# Patient Record
Sex: Female | Born: 1970 | Race: White | Hispanic: No | Smoking: Current every day smoker
Health system: Southern US, Community
[De-identification: ages and names within clinical notes are randomized; demographics above are authoritative.]

## PROBLEM LIST (undated history)

## (undated) DIAGNOSIS — IMO0001 Reserved for inherently not codable concepts without codable children: Secondary | ICD-10-CM

## (undated) DIAGNOSIS — I10 Essential (primary) hypertension: Secondary | ICD-10-CM

## (undated) DIAGNOSIS — G629 Polyneuropathy, unspecified: Secondary | ICD-10-CM

## (undated) DIAGNOSIS — R5383 Other fatigue: Secondary | ICD-10-CM

## (undated) DIAGNOSIS — F419 Anxiety disorder, unspecified: Secondary | ICD-10-CM

## (undated) DIAGNOSIS — E119 Type 2 diabetes mellitus without complications: Secondary | ICD-10-CM

## (undated) DIAGNOSIS — K859 Acute pancreatitis without necrosis or infection, unspecified: Secondary | ICD-10-CM

## (undated) DIAGNOSIS — K219 Gastro-esophageal reflux disease without esophagitis: Secondary | ICD-10-CM

## (undated) DIAGNOSIS — M1990 Unspecified osteoarthritis, unspecified site: Secondary | ICD-10-CM

## (undated) HISTORY — PX: ABSCESS DRAINAGE: SHX1119

## (undated) HISTORY — DX: Reserved for inherently not codable concepts without codable children: IMO0001

## (undated) HISTORY — PX: ANAL FISSURE REPAIR: SHX2312

## (undated) HISTORY — DX: Essential (primary) hypertension: I10

## (undated) HISTORY — PX: TUBAL LIGATION: SHX77

## (undated) HISTORY — DX: Anxiety disorder, unspecified: F41.9

## (undated) HISTORY — DX: Gastro-esophageal reflux disease without esophagitis: K21.9

## (undated) HISTORY — PX: TONSILLECTOMY AND ADENOIDECTOMY: SUR1326

## (undated) HISTORY — DX: Unspecified osteoarthritis, unspecified site: M19.90

## (undated) HISTORY — PX: CARDIAC CATHETERIZATION: SHX172

## (undated) HISTORY — DX: Type 2 diabetes mellitus without complications: E11.9

## (undated) HISTORY — DX: Acute pancreatitis without necrosis or infection, unspecified: K85.90

## (undated) HISTORY — DX: Other fatigue: R53.83

---

## 1994-11-17 HISTORY — PX: GALLBLADDER SURGERY: SHX652

## 2006-03-18 ENCOUNTER — Inpatient Hospital Stay: Payer: Self-pay | Admitting: Internal Medicine

## 2006-03-18 ENCOUNTER — Other Ambulatory Visit: Payer: Self-pay

## 2006-03-26 ENCOUNTER — Ambulatory Visit: Payer: Self-pay | Admitting: Family Medicine

## 2006-09-08 ENCOUNTER — Other Ambulatory Visit: Payer: Self-pay

## 2006-09-08 ENCOUNTER — Emergency Department: Payer: Self-pay | Admitting: General Practice

## 2007-01-19 ENCOUNTER — Ambulatory Visit: Payer: Self-pay | Admitting: Internal Medicine

## 2007-02-22 ENCOUNTER — Encounter: Payer: Self-pay | Admitting: Internal Medicine

## 2007-02-22 ENCOUNTER — Ambulatory Visit: Payer: Self-pay | Admitting: Internal Medicine

## 2007-02-22 DIAGNOSIS — L919 Hypertrophic disorder of the skin, unspecified: Secondary | ICD-10-CM

## 2007-02-22 DIAGNOSIS — L909 Atrophic disorder of skin, unspecified: Secondary | ICD-10-CM | POA: Insufficient documentation

## 2007-02-22 DIAGNOSIS — D239 Other benign neoplasm of skin, unspecified: Secondary | ICD-10-CM | POA: Insufficient documentation

## 2007-02-22 DIAGNOSIS — G479 Sleep disorder, unspecified: Secondary | ICD-10-CM | POA: Insufficient documentation

## 2007-02-22 DIAGNOSIS — K589 Irritable bowel syndrome without diarrhea: Secondary | ICD-10-CM | POA: Insufficient documentation

## 2007-04-19 ENCOUNTER — Inpatient Hospital Stay: Payer: Self-pay | Admitting: Internal Medicine

## 2007-04-19 ENCOUNTER — Other Ambulatory Visit: Payer: Self-pay

## 2007-04-22 ENCOUNTER — Encounter: Payer: Self-pay | Admitting: Internal Medicine

## 2007-04-26 ENCOUNTER — Telehealth (INDEPENDENT_AMBULATORY_CARE_PROVIDER_SITE_OTHER): Payer: Self-pay | Admitting: *Deleted

## 2007-04-26 ENCOUNTER — Emergency Department: Payer: Self-pay | Admitting: Emergency Medicine

## 2007-04-29 DIAGNOSIS — F329 Major depressive disorder, single episode, unspecified: Secondary | ICD-10-CM | POA: Insufficient documentation

## 2007-04-29 DIAGNOSIS — F410 Panic disorder [episodic paroxysmal anxiety] without agoraphobia: Secondary | ICD-10-CM | POA: Insufficient documentation

## 2007-04-29 DIAGNOSIS — I10 Essential (primary) hypertension: Secondary | ICD-10-CM | POA: Insufficient documentation

## 2007-04-29 DIAGNOSIS — F411 Generalized anxiety disorder: Secondary | ICD-10-CM | POA: Insufficient documentation

## 2007-04-29 DIAGNOSIS — F419 Anxiety disorder, unspecified: Secondary | ICD-10-CM | POA: Insufficient documentation

## 2007-05-06 ENCOUNTER — Emergency Department: Payer: Self-pay | Admitting: Emergency Medicine

## 2007-05-10 ENCOUNTER — Ambulatory Visit: Payer: Self-pay | Admitting: Internal Medicine

## 2007-05-10 DIAGNOSIS — K859 Acute pancreatitis without necrosis or infection, unspecified: Secondary | ICD-10-CM

## 2007-05-10 DIAGNOSIS — L506 Contact urticaria: Secondary | ICD-10-CM | POA: Insufficient documentation

## 2007-05-10 DIAGNOSIS — L509 Urticaria, unspecified: Secondary | ICD-10-CM

## 2007-05-17 ENCOUNTER — Encounter: Payer: Self-pay | Admitting: Internal Medicine

## 2007-06-16 ENCOUNTER — Other Ambulatory Visit: Payer: Self-pay

## 2007-06-16 ENCOUNTER — Observation Stay: Payer: Self-pay | Admitting: Internal Medicine

## 2007-06-16 ENCOUNTER — Telehealth (INDEPENDENT_AMBULATORY_CARE_PROVIDER_SITE_OTHER): Payer: Self-pay | Admitting: *Deleted

## 2007-06-17 ENCOUNTER — Encounter: Payer: Self-pay | Admitting: Internal Medicine

## 2007-06-18 ENCOUNTER — Ambulatory Visit: Payer: Self-pay | Admitting: Internal Medicine

## 2007-06-18 DIAGNOSIS — R1013 Epigastric pain: Secondary | ICD-10-CM | POA: Insufficient documentation

## 2007-10-06 ENCOUNTER — Telehealth (INDEPENDENT_AMBULATORY_CARE_PROVIDER_SITE_OTHER): Payer: Self-pay | Admitting: *Deleted

## 2007-10-18 ENCOUNTER — Telehealth: Payer: Self-pay | Admitting: Internal Medicine

## 2007-11-01 ENCOUNTER — Ambulatory Visit: Payer: Self-pay | Admitting: Pain Medicine

## 2007-11-12 ENCOUNTER — Telehealth (INDEPENDENT_AMBULATORY_CARE_PROVIDER_SITE_OTHER): Payer: Self-pay | Admitting: *Deleted

## 2007-11-18 ENCOUNTER — Emergency Department: Payer: Self-pay | Admitting: Emergency Medicine

## 2007-11-18 ENCOUNTER — Other Ambulatory Visit: Payer: Self-pay

## 2007-12-02 ENCOUNTER — Ambulatory Visit: Payer: Self-pay | Admitting: Pain Medicine

## 2007-12-22 ENCOUNTER — Telehealth (INDEPENDENT_AMBULATORY_CARE_PROVIDER_SITE_OTHER): Payer: Self-pay | Admitting: *Deleted

## 2007-12-28 ENCOUNTER — Ambulatory Visit: Payer: Self-pay | Admitting: Pain Medicine

## 2007-12-29 ENCOUNTER — Ambulatory Visit: Payer: Self-pay | Admitting: Pain Medicine

## 2007-12-31 ENCOUNTER — Emergency Department: Payer: Self-pay | Admitting: Emergency Medicine

## 2008-01-04 ENCOUNTER — Ambulatory Visit: Payer: Self-pay | Admitting: Pain Medicine

## 2008-01-10 ENCOUNTER — Ambulatory Visit: Payer: Self-pay | Admitting: Pain Medicine

## 2008-01-12 ENCOUNTER — Ambulatory Visit: Payer: Self-pay | Admitting: Pain Medicine

## 2008-07-04 ENCOUNTER — Other Ambulatory Visit: Payer: Self-pay

## 2008-07-04 ENCOUNTER — Emergency Department: Payer: Self-pay | Admitting: Emergency Medicine

## 2008-11-04 ENCOUNTER — Emergency Department: Payer: Self-pay | Admitting: Emergency Medicine

## 2009-04-30 ENCOUNTER — Ambulatory Visit: Payer: Self-pay | Admitting: Family Medicine

## 2009-07-17 DIAGNOSIS — G4730 Sleep apnea, unspecified: Secondary | ICD-10-CM | POA: Insufficient documentation

## 2009-10-26 ENCOUNTER — Ambulatory Visit: Payer: Self-pay | Admitting: Family Medicine

## 2009-11-24 ENCOUNTER — Ambulatory Visit: Payer: Self-pay | Admitting: Family Medicine

## 2010-02-28 ENCOUNTER — Ambulatory Visit: Payer: Self-pay | Admitting: Unknown Physician Specialty

## 2010-03-06 ENCOUNTER — Inpatient Hospital Stay: Payer: Self-pay | Admitting: Internal Medicine

## 2010-03-28 ENCOUNTER — Ambulatory Visit: Payer: Self-pay | Admitting: Unknown Physician Specialty

## 2010-04-04 ENCOUNTER — Encounter: Payer: Self-pay | Admitting: Unknown Physician Specialty

## 2010-04-17 ENCOUNTER — Encounter: Payer: Self-pay | Admitting: Unknown Physician Specialty

## 2010-11-17 HISTORY — PX: KNEE SURGERY: SHX244

## 2010-11-27 DIAGNOSIS — J309 Allergic rhinitis, unspecified: Secondary | ICD-10-CM | POA: Insufficient documentation

## 2011-03-28 ENCOUNTER — Emergency Department (HOSPITAL_COMMUNITY)
Admission: EM | Admit: 2011-03-28 | Discharge: 2011-03-29 | Disposition: A | Discharge status: Home / Self Care | Payer: Medicaid Other | Attending: Emergency Medicine | Admitting: Emergency Medicine

## 2011-03-28 DIAGNOSIS — R10812 Left upper quadrant abdominal tenderness: Secondary | ICD-10-CM | POA: Insufficient documentation

## 2011-03-28 DIAGNOSIS — E785 Hyperlipidemia, unspecified: Secondary | ICD-10-CM | POA: Insufficient documentation

## 2011-03-28 DIAGNOSIS — I10 Essential (primary) hypertension: Secondary | ICD-10-CM | POA: Insufficient documentation

## 2011-03-28 DIAGNOSIS — Z9089 Acquired absence of other organs: Secondary | ICD-10-CM | POA: Insufficient documentation

## 2011-03-28 DIAGNOSIS — R109 Unspecified abdominal pain: Secondary | ICD-10-CM | POA: Insufficient documentation

## 2011-03-28 LAB — DIFFERENTIAL
Basophils Relative: 0 % (ref 0–1)
Eosinophils Absolute: 0.1 10*3/uL (ref 0.0–0.7)
Eosinophils Relative: 1 % (ref 0–5)
Monocytes Absolute: 0.6 10*3/uL (ref 0.1–1.0)
Monocytes Relative: 5 % (ref 3–12)

## 2011-03-28 LAB — CBC
MCH: 29.3 pg (ref 26.0–34.0)
MCHC: 34 g/dL (ref 30.0–36.0)
Platelets: 292 10*3/uL (ref 150–400)
RDW: 13.5 % (ref 11.5–15.5)

## 2011-03-28 LAB — URINALYSIS, ROUTINE W REFLEX MICROSCOPIC
Glucose, UA: NEGATIVE mg/dL
Ketones, ur: NEGATIVE mg/dL
Nitrite: NEGATIVE
Protein, ur: NEGATIVE mg/dL
Urobilinogen, UA: 0.2 mg/dL (ref 0.0–1.0)

## 2011-03-28 LAB — COMPREHENSIVE METABOLIC PANEL
Albumin: 3.7 g/dL (ref 3.5–5.2)
BUN: 9 mg/dL (ref 6–23)
Chloride: 99 mEq/L (ref 96–112)
Creatinine, Ser: <0.47 mg/dL (ref 0.4–1.2)
Total Bilirubin: 0.4 mg/dL (ref 0.3–1.2)

## 2011-03-31 LAB — POCT PREGNANCY, URINE: Preg Test, Ur: NEGATIVE

## 2011-08-22 ENCOUNTER — Emergency Department: Payer: Self-pay | Admitting: Emergency Medicine

## 2011-12-18 ENCOUNTER — Ambulatory Visit: Payer: Self-pay | Admitting: Family Medicine

## 2012-04-26 ENCOUNTER — Ambulatory Visit: Payer: Self-pay | Admitting: Family Medicine

## 2012-10-03 ENCOUNTER — Emergency Department: Payer: Self-pay | Admitting: Emergency Medicine

## 2012-10-04 LAB — COMPREHENSIVE METABOLIC PANEL
Albumin: 3.2 g/dL — ABNORMAL LOW (ref 3.4–5.0)
Anion Gap: 5 — ABNORMAL LOW (ref 7–16)
BUN: 15 mg/dL (ref 7–18)
Bilirubin,Total: 0.2 mg/dL (ref 0.2–1.0)
Chloride: 104 mmol/L (ref 98–107)
Creatinine: 0.76 mg/dL (ref 0.60–1.30)
EGFR (African American): >60
Glucose: 201 mg/dL — ABNORMAL HIGH (ref 65–99)
Osmolality: 278 (ref 275–301)
Potassium: 4.1 mmol/L (ref 3.5–5.1)
Total Protein: 7.7 g/dL (ref 6.4–8.2)

## 2012-10-04 LAB — URINALYSIS, COMPLETE
Ketone: NEGATIVE
Leukocyte Esterase: NEGATIVE
Ph: 6 (ref 4.5–8.0)
Protein: NEGATIVE
RBC,UR: 3 /HPF (ref 0–5)

## 2012-10-04 LAB — CBC
HCT: 34.8 % — ABNORMAL LOW (ref 35.0–47.0)
HGB: 11.9 g/dL — ABNORMAL LOW (ref 12.0–16.0)
MCV: 89 fL (ref 80–100)
RDW: 13.8 % (ref 11.5–14.5)

## 2012-10-04 LAB — LIPASE, BLOOD: Lipase: 238 U/L (ref 73–393)

## 2013-02-03 DIAGNOSIS — E1165 Type 2 diabetes mellitus with hyperglycemia: Secondary | ICD-10-CM | POA: Insufficient documentation

## 2013-05-01 ENCOUNTER — Emergency Department: Payer: Self-pay | Admitting: Emergency Medicine

## 2013-05-01 LAB — COMPREHENSIVE METABOLIC PANEL
Albumin: 3.2 g/dL — ABNORMAL LOW (ref 3.4–5.0)
BUN: 8 mg/dL (ref 7–18)
Bilirubin,Total: 0.6 mg/dL (ref 0.2–1.0)
Calcium, Total: 8.8 mg/dL (ref 8.5–10.1)
Chloride: 102 mmol/L (ref 98–107)
Creatinine: 0.63 mg/dL (ref 0.60–1.30)
EGFR (African American): >60
EGFR (Non-African Amer.): >60
Osmolality: 272 (ref 275–301)
Potassium: 3.9 mmol/L (ref 3.5–5.1)
Sodium: 136 mmol/L (ref 136–145)

## 2013-05-01 LAB — URINALYSIS, COMPLETE
Glucose,UR: NEGATIVE mg/dL (ref 0–75)
Nitrite: NEGATIVE
Ph: 5 (ref 4.5–8.0)
Specific Gravity: 1.024 (ref 1.003–1.030)

## 2013-05-01 LAB — CBC
HCT: 36.2 % (ref 35.0–47.0)
MCH: 29.7 pg (ref 26.0–34.0)
MCHC: 34.1 g/dL (ref 32.0–36.0)
RBC: 4.15 10*6/uL (ref 3.80–5.20)
RDW: 14.1 % (ref 11.5–14.5)

## 2013-05-01 LAB — PREGNANCY, URINE: Pregnancy Test, Urine: NEGATIVE m[IU]/mL

## 2013-05-01 LAB — LIPASE, BLOOD: Lipase: 99 U/L (ref 73–393)

## 2013-08-16 ENCOUNTER — Ambulatory Visit: Payer: Self-pay | Admitting: Family Medicine

## 2013-08-18 ENCOUNTER — Encounter: Payer: Self-pay | Admitting: *Deleted

## 2013-08-19 ENCOUNTER — Encounter: Payer: Self-pay | Admitting: Podiatry

## 2013-08-19 ENCOUNTER — Ambulatory Visit (INDEPENDENT_AMBULATORY_CARE_PROVIDER_SITE_OTHER): Payer: BC Managed Care – PPO | Admitting: Podiatry

## 2013-08-19 VITALS — BP 116/63 | HR 61 | Temp 97.4°F | Resp 16 | Ht 64.0 in | Wt 339.6 lb

## 2013-08-19 DIAGNOSIS — M722 Plantar fascial fibromatosis: Secondary | ICD-10-CM

## 2013-08-19 MED ORDER — TRIAMCINOLONE ACETONIDE 10 MG/ML IJ SUSP
5.0000 mg | Freq: Once | INTRAMUSCULAR | Status: AC
  Administered 2013-08-19: 5 mg via INTRA_ARTICULAR

## 2013-08-19 NOTE — Progress Notes (Signed)
Subjective:     Patient ID: Gabrielle Sampson, female   DOB: [birth year], 42 y.o.   MRN: [id number]  HPI patient presents stating her left heel is hurting in a different place.   Review of Systems  All other systems reviewed and are negative.       Objective:   Physical Exam  Cardiovascular: Intact distal pulses.    Left heel is now painful in the outside plantar medial and central band currently doing well.    Assessment:     Compensation of the heel and patient's gait creating pain in the plantar lateral aspect of the heel.    Plan:     We will do one more injection to the outside plantar heel staying away from the area we have worked on previously. If symptoms do not respond I've advised her to see Dr. Al Corpus who has seen her from the beginning and we'll need to consider surgical intervention or other more aggressive treatment plan. Prepped the lateral side of the left heel and then injected with 3 mg Kenalog with xylocaine, marcaine mixture. Applied a Band-Aid to wear the injection was performed

## 2013-08-19 NOTE — Patient Instructions (Signed)
If no improvement see Dr. Al Corpus to consider surgery

## 2013-09-15 ENCOUNTER — Ambulatory Visit: Payer: BC Managed Care – PPO | Admitting: Podiatry

## 2013-10-06 ENCOUNTER — Ambulatory Visit: Payer: BC Managed Care – PPO | Admitting: Podiatry

## 2014-01-26 ENCOUNTER — Emergency Department: Payer: Self-pay | Admitting: Emergency Medicine

## 2014-04-06 ENCOUNTER — Ambulatory Visit: Payer: BC Managed Care – PPO | Admitting: Podiatry

## 2014-07-09 ENCOUNTER — Emergency Department: Payer: Self-pay | Admitting: Internal Medicine

## 2014-07-09 LAB — COMPREHENSIVE METABOLIC PANEL
ALBUMIN: 2.9 g/dL — AB (ref 3.4–5.0)
ALK PHOS: 114 U/L
Anion Gap: 10 (ref 7–16)
BUN: 11 mg/dL (ref 7–18)
Bilirubin,Total: 1 mg/dL (ref 0.2–1.0)
CALCIUM: 8.7 mg/dL (ref 8.5–10.1)
CO2: 23 mmol/L (ref 21–32)
CREATININE: 0.59 mg/dL — AB (ref 0.60–1.30)
Chloride: 102 mmol/L (ref 98–107)
EGFR (African American): >60
GLUCOSE: 117 mg/dL — AB (ref 65–99)
Osmolality: 271 (ref 275–301)
POTASSIUM: 5.5 mmol/L — AB (ref 3.5–5.1)
SGOT(AST): 53 U/L — ABNORMAL HIGH (ref 15–37)
SGPT (ALT): 21 U/L
Sodium: 135 mmol/L — ABNORMAL LOW (ref 136–145)
TOTAL PROTEIN: 8.2 g/dL (ref 6.4–8.2)

## 2014-07-09 LAB — URINALYSIS, COMPLETE
BILIRUBIN, UR: NEGATIVE
GLUCOSE, UR: NEGATIVE mg/dL (ref 0–75)
KETONE: NEGATIVE
Nitrite: NEGATIVE
Ph: 6 (ref 4.5–8.0)
Protein: 30
RBC,UR: 5 /HPF (ref 0–5)
SPECIFIC GRAVITY: 1.023 (ref 1.003–1.030)

## 2014-07-09 LAB — CBC
HCT: 33.7 % — ABNORMAL LOW (ref 35.0–47.0)
HGB: 10.9 g/dL — ABNORMAL LOW (ref 12.0–16.0)
MCH: 28.1 pg (ref 26.0–34.0)
MCHC: 32.4 g/dL (ref 32.0–36.0)
MCV: 87 fL (ref 80–100)
Platelet: 193 10*3/uL (ref 150–440)
RBC: 3.89 10*6/uL (ref 3.80–5.20)
RDW: 15.8 % — AB (ref 11.5–14.5)
WBC: 14 10*3/uL — ABNORMAL HIGH (ref 3.6–11.0)

## 2014-07-09 LAB — LIPASE, BLOOD: Lipase: 134 U/L (ref 73–393)

## 2014-08-23 DIAGNOSIS — Z6841 Body Mass Index (BMI) 40.0 and over, adult: Secondary | ICD-10-CM

## 2014-09-01 ENCOUNTER — Emergency Department: Payer: Self-pay | Admitting: Emergency Medicine

## 2014-09-01 LAB — COMPREHENSIVE METABOLIC PANEL
ALBUMIN: 3.2 g/dL — AB (ref 3.4–5.0)
AST: 36 U/L (ref 15–37)
Alkaline Phosphatase: 151 U/L — ABNORMAL HIGH
Anion Gap: 8 (ref 7–16)
BILIRUBIN TOTAL: 0.5 mg/dL (ref 0.2–1.0)
BUN: 12 mg/dL (ref 7–18)
CHLORIDE: 100 mmol/L (ref 98–107)
CO2: 29 mmol/L (ref 21–32)
CREATININE: 0.7 mg/dL (ref 0.60–1.30)
Calcium, Total: 9 mg/dL (ref 8.5–10.1)
Glucose: 188 mg/dL — ABNORMAL HIGH (ref 65–99)
OSMOLALITY: 279 (ref 275–301)
POTASSIUM: 4.2 mmol/L (ref 3.5–5.1)
SGPT (ALT): 30 U/L
Sodium: 137 mmol/L (ref 136–145)
Total Protein: 8.6 g/dL — ABNORMAL HIGH (ref 6.4–8.2)

## 2014-09-01 LAB — CBC
HCT: 35.9 % (ref 35.0–47.0)
HGB: 11.7 g/dL — AB (ref 12.0–16.0)
MCH: 27.6 pg (ref 26.0–34.0)
MCHC: 32.6 g/dL (ref 32.0–36.0)
MCV: 85 fL (ref 80–100)
Platelet: 295 10*3/uL (ref 150–440)
RBC: 4.24 10*6/uL (ref 3.80–5.20)
RDW: 15.2 % — ABNORMAL HIGH (ref 11.5–14.5)
WBC: 14 10*3/uL — AB (ref 3.6–11.0)

## 2014-09-03 ENCOUNTER — Emergency Department: Payer: Self-pay | Admitting: Emergency Medicine

## 2015-01-11 ENCOUNTER — Ambulatory Visit: Payer: Self-pay | Admitting: Family Medicine

## 2015-01-24 ENCOUNTER — Inpatient Hospital Stay: Payer: Self-pay | Admitting: Internal Medicine

## 2015-01-27 DIAGNOSIS — Z8614 Personal history of Methicillin resistant Staphylococcus aureus infection: Secondary | ICD-10-CM | POA: Insufficient documentation

## 2015-02-05 ENCOUNTER — Inpatient Hospital Stay: Payer: Self-pay | Admitting: Internal Medicine

## 2015-02-08 LAB — BASIC METABOLIC PANEL
ANION GAP: 5 — AB (ref 7–16)
BUN: 19 mg/dL
CALCIUM: 8.7 mg/dL — AB
Chloride: 103 mmol/L
Co2: 29 mmol/L
Creatinine: 0.76 mg/dL
EGFR (Non-African Amer.): >60
GLUCOSE: 107 mg/dL — AB
Potassium: 4 mmol/L
SODIUM: 137 mmol/L

## 2015-02-08 LAB — CBC WITH DIFFERENTIAL/PLATELET
BASOS PCT: 0.7 %
Basophil #: 0.1 10*3/uL (ref 0.0–0.1)
Eosinophil #: 0.2 10*3/uL (ref 0.0–0.7)
Eosinophil %: 2.5 %
HCT: 28.2 % — AB (ref 35.0–47.0)
HGB: 8.8 g/dL — AB (ref 12.0–16.0)
LYMPHS ABS: 2.2 10*3/uL (ref 1.0–3.6)
Lymphocyte %: 26.9 %
MCH: 26.5 pg (ref 26.0–34.0)
MCHC: 31.2 g/dL — AB (ref 32.0–36.0)
MCV: 85 fL (ref 80–100)
MONO ABS: 0.5 x10 3/mm (ref 0.2–0.9)
Monocyte %: 6 %
NEUTROS ABS: 5.2 10*3/uL (ref 1.4–6.5)
Neutrophil %: 63.9 %
Platelet: 244 10*3/uL (ref 150–440)
RBC: 3.32 10*6/uL — ABNORMAL LOW (ref 3.80–5.20)
RDW: 15.3 % — ABNORMAL HIGH (ref 11.5–14.5)
WBC: 8.1 10*3/uL (ref 3.6–11.0)

## 2015-02-08 LAB — VANCOMYCIN, TROUGH: Vancomycin, Trough: 11 ug/mL

## 2015-02-09 LAB — VANCOMYCIN, TROUGH: VANCOMYCIN, TROUGH: 18 ug/mL

## 2015-02-10 LAB — VANCOMYCIN, TROUGH: VANCOMYCIN, TROUGH: 17 ug/mL

## 2015-02-22 DIAGNOSIS — L02211 Cutaneous abscess of abdominal wall: Secondary | ICD-10-CM | POA: Insufficient documentation

## 2015-02-28 DIAGNOSIS — G2581 Restless legs syndrome: Secondary | ICD-10-CM | POA: Insufficient documentation

## 2015-03-18 NOTE — H&P (Signed)
PATIENT NAME:  Gabrielle Sampson, Gabrielle Sampson MR#:  [id number] DATE OF BIRTH:  Mar 04, 1971  DATE OF ADMISSION:  02/05/2015  PRIMARY CARE PROVIDER:  Abby D. Bender, MD   CHIEF COMPLAINT:  Pain in the suprapubic area with purulent discharge, fever, and weakness.   HISTORY OF PRESENTING ILLNESS:  This is a 44 year old Caucasian female patient with history of morbid obesity, diabetes, and hypertension, recently in the hospital for abdominal wall abscess in the suprapubic area and discharged home with a PICC line on IV vancomycin and oral clindamycin on 01/30/2015, who returns with no significant improvement of her symptoms.   The patient was in the hospital from 01/24/2015, to 01/30/2015. She had an I and D done with Penrose drains on 01/26/2015. The patient has been getting vancomycin. She did miss one dose on 02/02/2015, due to some itching from the vancomycin dose, but has been taking all of her other doses regularly. She did have some blood drawn on 02/02/2015, for vancomycin level; results are unknown at this point.   Her blood sugars have been fairly controlled around 120 according to the patient. Her temperatures have been running in the high 99s, but have not crossed 100 at home. Her dressings have been changed by her daughter, who is a [employer].   PAST MEDICAL HISTORY: 1.  Diabetes mellitus.  2.  Hypertension.  3.  Morbid obesity.  4.  Chronic pancreatitis.  5.  Anxiety.  6.  Depression.  7.  Hypertriglyceridemia.   SOCIAL HISTORY:  The patient quit smoking in the past. She does not drink any alcohol. No illicit drugs. Ambulates on her own.   FAMILY HISTORY:  Father had MI in his 33s along with hypertension and CVA.   REVIEW OF SYSTEMS:  CONSTITUTIONAL:  Complains of fever, chills, and fatigue.  EYES:  No blurred vision, pain, or redness.  EARS, NOSE, AND THROAT:  No tinnitus, ear pain, or hearing loss.  RESPIRATORY:  No cough, wheeze, or hemoptysis.  CARDIOVASCULAR:  No chest pain, orthopnea, or  edema. GASTROINTESTINAL:  Has lower abdominal pain.  GENITOURINARY:  No dysuria or hematuria.  ENDOCRINE:  No polyuria or nocturia.  NEUROLOGIC:  No focal numbness or weakness.  PSYCHIATRIC:  Has depression.   HOME MEDICATIONS: 1.  Vancomycin 1500 mg IV 2 times a day.  2.  Clindamycin 300 mg 3 times a day.  3.  Clonazepam 1 mg daily.  4.  Gabapentin 300 mg 2 times a day.  5.  Glipizide 10 mg daily.  6.  Lisinopril 10 mg daily.  7.  Metoprolol tartrate 50 mg 2 times a day.  8.  Omeprazole 40 mg daily.  9.  Bupropion 300 mg extended-release daily.  10.  Acetaminophen/oxycodone 325/10, 1 tablet every 6 hours as needed for pain.   ALLERGIES:  BETADINE, CODEINE, IODINE, MORPHINE.   PHYSICAL EXAMINATION: VITAL SIGNS:  Temperature 98.3, pulse rate 64, blood pressure 131/70, saturating 97% on room air.  GENERAL:  Morbidly obese Caucasian female patient lying in bed in mild distress secondary to her pain.  PSYCHIATRIC:  Alert and oriented x 3, pleasant.  HEENT:  Atraumatic, normocephalic. Oral mucosa is moist and pink. External ears and nose are normal. No pallor. No icterus. Pupils are bilaterally equal and reactive to light.  NECK:  Supple. No thyromegaly. No palpable lymph nodes. Trachea is midline. No carotid bruit or JVD.  CARDIOVASCULAR:  S1 and S2 without any murmurs. Peripheral pulses are 2+. No edema.  RESPIRATORY:  Normal work of breathing.  Clear to auscultation on both sides.  GASTROINTESTINAL:  Soft abdomen, nontender. Bowel sounds were present. No hepatosplenomegaly palpable.  SKIN:  Warm and dry. Has erythema and swelling in the suprapubic area with some purulent discharge around the drain. No fluctuance noticed.  NEUROLOGICAL:  Motor strength is 5 out of 5 in upper and lower extremities.   LABORATORY STUDIES:  Glucose of 86, BUN 8, creatinine 0.863. AST, ALT, alkaline phosphatase were normal. WBC was 9.6, hemoglobin 8.7, and platelets were 245,000.   Recent vancomycin level  on 01/30/2015, was 10.   CT scan of the abdomen and pelvis without contrast shows no drainable fluid, suprapubic area with soft tissue swelling.   ASSESSMENT AND PLAN: 1.  Suprapubic area abdominal wall cellulitis. The patient is status post incision and drainage, on vancomycin IV and oral clindamycin. The patient seems to have worsening symptoms with some fever at home. At this point, I will admit the patient. The patient had methicillin-resistant Staphylococcus aureus grow out of cultures from the incision and drainage. We will have to get a vancomycin level to see if she needs higher doses of vancomycin at home. We will keep the PICC line. Continue antibiotics. Consult Dr. Ola Spurr with infectious disease. No drainable fluid on CAT scan. The patient is afebrile here in the Emergency Room with normal white count.  2.  Diabetes mellitus. Continue home medications along with sliding scale insulin.  3.  Hypertension. Continue lisinopril. 4.  Anemia of chronic disease, stable.  5.  Deep vein thrombosis prophylaxis with Lovenox.   CODE STATUS:  Full code.   TIME SPENT ON THIS CASE:  35 minutes.   ____________________________ Leia Alf Aubery Date, MD srs:nb D: 02/05/2015 03:41:16 ET T: 02/05/2015 04:27:00 ET JOB#: [id number]  cc: Alveta Heimlich R. Lane Kjos, MD, <Dictator> Neita Carp MD ELECTRONICALLY SIGNED 02/06/2015 2:25

## 2015-03-18 NOTE — H&P (Signed)
PATIENT NAME:  Gabrielle Sampson, Gabrielle Sampson MR#:  [id number] DATE OF BIRTH:  1970/12/16  DATE OF ADMISSION:  01/24/2015  ADDENDUM:  ALLERGIES: BETADINE, CODEINE, IODINE STRONG, MORPHINE.  HOME MEDICATIONS: Omeprazole 40 mg p.o. daily, Lopressor 50 mg p.o. b.i.d., lisinopril 10 mg p.o. daily, gabapentin 300 mg 2 capsules in the morning 1 capsule in the evening, clonazepam 1 mg p.o. at bedtime, citalopram 20 mg p.o. daily in the morning, bupropion 300 mg 1 tablet once a day at bedtime.    ____________________________ Demetrios Loll, MD qc:TM D: 01/24/2015 17:21:05 ET T: 01/24/2015 17:31:05 ET JOB#: [id number]  cc: Demetrios Loll, MD, <Dictator> Demetrios Loll MD ELECTRONICALLY SIGNED 01/24/2015 22:45

## 2015-03-18 NOTE — Consult Note (Signed)
PATIENT NAME:  Gabrielle Sampson, Gabrielle Sampson MR#:  [id number] DATE OF BIRTH:  20-Aug-1971  DATE OF CONSULTATION:  01/29/2015  REFERRING PHYSICIAN:   CONSULTING PHYSICIAN:  Cheral Marker. Ola Spurr, MD  REQUESTING PHYSICIAN:  Gladstone Lighter, MD.       REASON FOR CONSULTATION: MRSA infection.   HISTORY OF PRESENT ILLNESS: This is a very pleasant 44 year old female with history of well-controlled diabetes as well as morbid obesity, who was admitted March 9 with pain and redness in her pubic area. She reports she had developed what appeared to be an abscess there the day prior and went to [facility]. She had an I and D done and was started on Bactrim, however she became quite ill with nausea and vomiting, and the redness spread, so she came to the Emergency Room. Since admission the patient has had surgical I and D by Dr. Rexene Edison March 11. She has a Penrose drain in place. She remains on vancomycin. Culture is growing MRSA. We are consulted for further antibiotic management.   Currently she reports still being in pain and still having drainage. She is also quite constipated.   PAST MEDICAL HISTORY:  1. Hypertension.  2. Diabetes, is well controlled.  3. Chronic pancreatitis.  4. Anxiety.  5. Depression.  6. Hypertriglyceridemia.   PAST SURGICAL HISTORY: Cholecystectomy, tubal ligation, C-section, right knee surgery, tonsillectomy, adenoidectomy, uterine operation, and AAA repair.   SOCIAL HISTORY: She does not smoke, does not drink. 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 Lives with her daughters.   FAMILY HISTORY: Noncontributory.   REVIEW OF SYSTEMS: 11 systems reviewed and negative except as per HPI.   ALLERGIES: SHE IS ALLERGIC TO BETADINE, CODEINE, IODINE, AND MORPHINE.   ANTIBIOTICS SINCE ADMISSION: Unasyn which she received March 9-12, clindamycin begun March 12, vancomycin begun March 9.   PHYSICAL EXAMINATION:  VITAL SIGNS: Temperature 97.8, pulse 68, blood pressure 107/49,  respirations 18, saturation 95%.  GENERAL: She is morbidly obese, lying in bed, in no acute distress.  HEENT: Pupils reactive. Sclerae anicteric. Oropharynx clear.  NECK: Supple.  HEART: Regular.  LUNGS: Clear.  ABDOMEN: Soft, obese.  SKIN:  She has over her perineum a marked swelling with peau d'orange skin. She has several incisions sites with Penrose drains. The left side is soft, however over the right there is some induration and tenderness.   LABORATORY DATA: Culture from her surgical site grew moderate growth of MRSA sensitive to Bactrim, clindamycin, ciprofloxacin, gentamicin, vancomycin, levofloxacin, linezolid, tigecycline. White blood count on March 9 was 11.8, currently 8.9, hemoglobin 9.8, platelets 180,000. A1c is 6.5.  Renal function is normal, creatinine 0.7.   IMPRESSION: A 44 year old with well-controlled diabetes, as well as morbid obesity, admitted with pubic abscess status post intraoperative incision and drainage March 12, cultures with methicillin-resistant Staphylococcus aureus. Drains are in place. She is afebrile, however still has significant pain and induration.   RECOMMENDATIONS:  1.  She will need at least 7-10 more days of IV antibiotics. Suggest placing a PICC which I have ordered. She will need the vancomycin and can have oral clindamycin for the next few days as well.  2.  I can see in clinic in followup in approximately 1 week to follow up. She may need further surgical drainage if the abscess remains loculated.  3.  Thank you for the consult. I will be glad to follow with you.      ____________________________ Cheral Marker. Ola Spurr, MD dpf:bu D: 01/29/2015 14:46:12 ET T: 01/29/2015 15:16:03 ET  JOB#: [id number]  cc: Cheral Marker. Ola Spurr, MD, <Dictator> Mell Guia Ola Spurr MD ELECTRONICALLY SIGNED 02/08/2015 22:10

## 2015-03-18 NOTE — Consult Note (Signed)
PATIENT NAME:  Gabrielle Sampson, Gabrielle Sampson MR#:  [id number] DATE OF BIRTH:  1971-10-16  DATE OF CONSULTATION:  01/25/2015  REFERRING PHYSICIAN:   CONSULTING PHYSICIAN:  Harrell Gave A. Damond Borchers, MD  REASON FOR CONSULTATION: Cellulitis and previous abscess in pannus, superior groin.   HISTORY OF PRESENT ILLNESS: Gabrielle Gabrielle Sampson is a pleasant 44 year old obese diabetic female who presents with approximately 5 days of suprapubic pain, redness, and abscess. Initially this began early in the morning, 4 days ago she went to [facility] had an I and D, has been feeling worse since then. It is quite tender and she also has nausea, vomiting, and general malaise. She was prescribed p.o. Bactrim prior to going at her office and was not able to take that. Did have low-grade fevers and chills. Has never had this before. No chest pain, shortness of breath, cough, diarrhea. Does feel slightly constipated. No dysuria or hematuria.   PAST MEDICAL HISTORY: Hypertension, diabetes, pancreatitis, anxiety, depression, hypertriglyceridemia, status post cholecystectomy, status post tubal ligation, status post C-section, status post knee surgery, status post tonsillectomy and adenoidectomy, status post D and C recently, and AAA repair.   SOCIAL HISTORY: Quit smoking, has history of smoking. Denies any alcohol or drugs.   FAMILY HISTORY: Coronary artery disease, hypertension, diabetes, and cancers.   REVIEW OF SYSTEMS:  12 point review of systems was obtained. Pertinent positives and negatives as above.   PHYSICAL EXAMINATION:  VITAL SIGNS: Temperature 97.8, pulse 81, blood pressure 111/70, respirations 18.  GENERAL: No acute distress, alert and oriented x 3, obese.  HEAD: Normocephalic, atraumatic.  EYES: No scleral icterus. No conjunctivitis.  FACE: No obvious face trauma. Normal external nose. Normal external ear.  CHEST: Lungs clear to auscultation. Moving air well.  HEART: Regular rate and rhythm. No murmurs, rubs, or  gallops.  ABDOMEN: Nontender, and nondistended. Suprapubic region and groin region does have diffuse cellulitis as well as a punctate lesion from her previous I and D, which does have some pus, cannot determine with confidence whether or not it is completely drained.   LABORATORY DATA: White cell count of 8.9 currently, 11.8 at admission, 81% neutrophils currently. Blood sugars are 190-241.   ASSESSMENT AND PLAN: Gabrielle Sampson is a pleasant 44 year old with what looks like cellulitis and purulence from a previous incision and drainage in her suprapubic region, cannot assess due to patient discomfort the full extent and whether or not there are loculated purulent collections.  I have spoken to her and as she has eaten today OR is not a possibility. Have discussed with her possibility of I and D tomorrow in the OR, will be n.p.o. after midnight. We will call Dr. Vianne Bulls to adjust diabetes medicines accordingly.    ____________________________ Glena Norfolk Noble Cicalese, MD cal:bu D: 01/25/2015 14:04:01 ET T: 01/25/2015 17:31:00 ET JOB#: [id number]  cc: Harrell Gave A. Birgitta Uhlir, MD, <Dictator> Floyde Parkins MD ELECTRONICALLY SIGNED 01/28/2015 11:36

## 2015-03-18 NOTE — Op Note (Signed)
PATIENT NAME:  Gabrielle Sampson, Gabrielle Sampson MR#:  [id number] DATE OF BIRTH:  23-Aug-1971  DATE OF PROCEDURE:  01/27/2015  ATTENDING:   Harrell Gave A. Aamiyah Derrick, MD  PREOPERATIVE DIAGNOSIS: Right groin abscess.   POSTOPERATIVE DIAGNOSIS:  Right groin abscess.    PROCEDURE PERFORMED: I and D of right groin abscess.   ANESTHESIA: General.   ESTIMATED BLOOD LOSS: 25 mL.   COMPLICATIONS: None.   SPECIMENS: Swab for Gram stain culture and sensitivity.   INDICATION FOR SURGERY: [name] is a pleasant 44 year old with  right-sided groin pain and cellulitis. She had previously an I and D which did not resolve her symptoms. She was thus brought to the operating room for repeat I and D of groin abscess.   DETAILS OF PROCEDURE:  As follows: Informed consent was obtained. [name] was brought to the operating room suite. She was induced. Endotracheal tube was placed, general anesthesia was administered. Her groin was then prepped and draped in standard surgical fashion. A timeout was then performed correctly identifying the patient name, operative site and procedure to be performed. An incision was made at the  site of  previous I and D.   This was debrided. I then broke in with a Claiborne Billings and found multiple areas of loculated abscess traveling laterally and medially.  The tracts were then irrigated until purulence was gone.  I then placed a Penrose, made a counterincision laterally and medially, and placed Penroses at each which were secured with 3-0 nylon sutures. I did have some bleeding at the time of layering off the edges from the abscess cavity and, therefore, I did have to place some absorbable 3-0 Vicryl sutures to control this.   I then  packed the wound with gauze.   I did not use iodine gauze which I normally use because of the patient's allergy.   Dressing was placed over the wound. The patient was then awoken, extubated and brought to the postanesthesia care unit. There were no immediate complications.  Needle, sponge, and instrument counts were correct at the end of the procedure.    ____________________________ Glena Norfolk. Adilenne Ashworth, MD cal:tr D: 01/27/2015 07:18:24 ET T: 01/27/2015 12:56:10 ET JOB#: [id number]  cc: Harrell Gave A. Viliami Bracco, MD, <Dictator> Floyde Parkins MD ELECTRONICALLY SIGNED 01/28/2015 11:37

## 2015-03-18 NOTE — Op Note (Signed)
PATIENT NAME:  Gabrielle Sampson, Gabrielle Sampson MR#:  [id number] DATE OF BIRTH:  July 31, 1971  DATE OF PROCEDURE:  02/07/2015  PREOPERATIVE DIAGNOSIS: Suprapubic abscess and recurrent/persistent.   POSTOPERATIVE DIAGNOSIS: Suprapubic abscess and recurrent/persistent.   OPERATION: Incision and drainage.   ANESTHESIA: General.   SURGEON: Rodena Goldmann, MD.   OPERATIVE PROCEDURE: With the patient in the supine position after the induction of appropriate general anesthesia, the patient's suprapubic area was prepped with Hibiclens. The area was draped sterilely. The previous drain site was reopened. A small amount of purulent material was encountered. Multiple pathways in the subcutaneous and deep space were encountered. The incision was enlarged. No fascial involvement was evident.  There were multiple small draining sites in the abscess cavity. Two counter incisions were made. Penrose drains were placed. The previous drainage tract was reopened and a Penrose drain replaced.  Drains were secured with 3-0 nylon. Sterile dressings were applied. The patient was returned to the recovery room, having tolerated the procedure well. Sponge, instrument and needle counts were correct x2 in the operating room.   ____________________________ Rodena Goldmann III, MD rle:AT D: 02/07/2015 15:37:04 ET T: 02/08/2015 08:40:36 ET JOB#: [id number]  cc: Rodena Goldmann III, MD, <Dictator> Rodena Goldmann MD ELECTRONICALLY SIGNED 02/13/2015 20:47

## 2015-03-18 NOTE — H&P (Signed)
PATIENT NAME:  Gabrielle Sampson, Gabrielle Sampson MR#:  [id number] DATE OF BIRTH:  28-Nov-1970  DATE OF ADMISSION:  01/24/2015  PRIMARY CARE PHYSICIAN:  Dr. Rebeca Alert.    REFERRING PHYSICIAN:  Dr. Karma Greaser.   CHIEF COMPLAINT: Pubic area infection for 2 days.   HISTORY OF PRESENT ILLNESS: A 44 year old Caucasian female with a history of hypertension and diabetes, presented to the ED with the above chief complaint. The patient is alert, awake, oriented, in no acute distress. The patient said that she found a small area of redness in pubic area which has spread very quickly, so she went to [facility], was found to have an abscess which was drained.  She got I and D there and was given Bactrim p.o., then discharged to home. The patient developed nausea, vomiting, she cannot take p.o. Bactrim.  The infection area became worse and bigger. She also has a fever and chills, so she came to ED for further evaluation. The patient denies any abdominal pain. No diarrhea. No dysuria, hematuria, or incontinence. The patient denies any other symptoms.   PAST MEDICAL HISTORY: Hypertension, diabetes, chronic pancreatitis, anxiety, depression, hypertriglyceridemia.    PAST SURGICAL HISTORY: Cholecystectomy, tubal ligation,  C-section, right knee surgery, tonsillectomy, adenoidectomy, uterine operation, AAA repair.   SOCIAL HISTORY: Quit smoking. Denies any alcohol drinking or illicit drugs.   FAMILY HISTORY: Father had MI at 63s, in addition her father had hypertension, CVA, and heart disease in paternal side of family.   REVIEW OF SYSTEMS:  CONSTITUTIONAL: The patient has a fever and chills, but no headache or dizziness.  Has generalized weakness.  EYES: No double vision, blurred vision.  EARS, NOSE, AND THROAT: No postnasal drip, slurred speech, or dysphagia. CARDIOVASCULAR: No chest pain, palpitation, orthopnea, or nocturnal dyspnea. No leg edema.  PULMONARY: No cough, sputum, shortness of breath, or hematemesis.   GASTROINTESTINAL: No abdominal pain, nausea, vomiting, or diarrhea. No melena or bloody stool, but has tenderness in pubic area around the infection area.  GENITOURINARY: No dysuria, hematuria, or incontinence.  SKIN: No rash or jaundice, but has infection and abscess.  NEUROLOGY: No syncope, loss of consciousness, or seizure.  ENDOCRINE: No polyuria, polydipsia, heat or cold intolerance.  HEMATOLOGY: No easy bleeding or bleeding.   PHYSICAL EXAMINATION:  VITAL SIGNS: Temperature 99.6, blood pressure 139/67, pulse 91, O2 saturation 95% in room air.  GENERAL: The patient is alert, awake, oriented, in no acute distress, obese.  HEENT: Pupils round, equal, reactive to light and accommodation. Dry oral mucosa. Clear oropharynx.  NECK: Supple. No JVD or carotid bruit. No lymphadenopathy. No thyromegaly.  CARDIOVASCULAR: S1, S2, regular rate and rhythm. No murmurs, gallop.  PULMONARY: Bilateral air entry. No wheezing or rales. No use of accessory muscle to breathe.  ABDOMEN: Soft, obese. Bowel sounds present. No distention. No tenderness. No organomegaly.  PELVIC:  There is a 1.5 cm drainage wound in pubic area on the right side with extensive erythema, swelling and tenderness, and warmness.  EXTREMITIES: No edema, clubbing, or cyanosis, no calf tenderness, but has pedal pulses present.  SKIN:  No rash or jaundice.  NEUROLOGY: A and O x 3. No focal deficit. Power 5 out of 5. Sensation intact.   LABORATORY DATA: WBC 11.8, hemoglobin 10.6, platelets 228,000. Glucose 112, BUN 8, creatinine 0.7, sodium 134, potassium 3.8, chloride 96, bicarbonate 28.   IMPRESSIONS:  1.  Pubic area cellulitis.  2.  Pubic area abscess status post incision and drainage 2 days ago.  3.  Hypertension.  4.  Diabetes.  5.  Morbid obesity.  6.  Hyponatremia.  7.  Anemia.   PLAN OF TREATMENT:  1.  The patient will be admitted to medical floor. The patient has failed outpatient p.o. antibiotics treatment. I will start  vancomycin and Unasyn IV PB. Follow up CBC and we will get a wound care consult.  2.  For hypertension, continue the patient's home hypertension medication including Lopressor and lisinopril.  3.  For diabetes we will start sliding scale, check hemoglobin A1c.   4.  I discussed the patient's condition and plan of treatment with the patient and the patient's daughter.   TIME SPENT: About 53 minutes.    ____________________________ Demetrios Loll, MD qc:bu D: 01/24/2015 17:19:35 ET T: 01/24/2015 17:51:55 ET JOB#: [id number]  cc: Demetrios Loll, MD, <Dictator> Demetrios Loll MD ELECTRONICALLY SIGNED 01/24/2015 22:47

## 2015-03-18 NOTE — Discharge Summary (Signed)
PATIENT NAME:  Gabrielle Sampson, SROKA MR#:  [id number] DATE OF BIRTH:  05/11/1971  DATE OF ADMISSION:  01/24/2015 DATE OF DISCHARGE:  01/30/2015  ADMITTING PHYSICIAN: Dr. Bridgett Larsson.  DISCHARGING PHYSICIAN:  Gladstone Lighter, M.D.   PRIMARY CARE PHYSICIAN:  Dr. Rebeca Alert.  CONSULTATIONS IN THE HOSPITAL:    1.  Surgical consultation with Dr. Rexene Edison.  2.  ID consultation by Dr. Ola Spurr.    DISCHARGE DIAGNOSES:  1.  Severe pubic cellulitis with abscesses status post drainage, cultures growing MRSA.  2.  Obesity.  3.  Diabetes mellitus.  4.  Constipation.  5.  Obstructive sleep apnea.  6.  Hypertension.  7.  Gastroesophageal reflux disease.   DISCHARGE HOME MEDICATIONS:    1.  Prilosec 40 mg p.o. daily.  2.  Lisinopril 10 mg p.o. at bedtime.  3.  Klonopin 1 mg p.o. at bedtime.  4.  Gabapentin 300 mg p.o. daily at bedtime.  5.  Celexa 20 mg p.o. daily.  6.  Bupropion 300 mg p.o. at bedtime.  7.  Metoprolol 50 mg p.o. b.i.d.  8.  Gabapentin 600 mg p.o. in the morning.  9.  Glipizide 10 mg p.o. daily.  10.  Clindamycin 300 mg p.o. q. 8 hours for 10 days.  11.  Vancomycin 1500 mg IV b.i.d. for 7 days.  12.  Percocet 10/325 mg 1 tablet q.6 hours p.r.n. for moderate pain.  DISCHARGE HOME OXYGEN:  2 liters.   DISCHARGE DIET:  Low-sodium, ADA 1800 calorie diet.   DISCHARGE ACTIVITY: As tolerated.    FOLLOWUP INSTRUCTIONS:  1.  Follow up with Dr. Ola Spurr in 1 week.  2.  Home health nursing and PICC line care and dressing changes as needed.  3.  Surgical follow-up with Dr. Rexene Edison in 1 week.  4.  PCP follow-up in 1 to 2 weeks.   LABORATORY AND IMAGING STUDIES:   1. Prior to discharge: WBC 7.5, hemoglobin 8.1, hematocrit 25.6, platelet count 211,000.  2. Sodium 136, potassium 4.1, chloride 104, bicarbonate 24, BUN 12, creatinine 0.7, glucose 127, and calcium of 8.2. Urinalysis negative for any infection. Wound cultures growing heavy growth MRSA.    3. Chest x-ray showing PICC line  is in place and lungs are clear, mildly enlarged heart, but no pneumothorax, or pleural effusion.   BRIEF HOSPITAL COURSE:  Ms. Barbee Mamula is a 44 year old obese Caucasian female with history of hypertension, diabetes, obstructive sleep apnea, who has had pubic area infection for a couple of days prior to admission.  She had an abscess drained in the area, was on p.o. Bactrim; however, was getting worse and so presents back.  1. MRSA, pubic cellulitis with abscesses.  In spite of drainage, she failed outpatient antibiotic treatment.  She was admitted seen by surgery.  She had and  I and D done in the OR with drains placed in. She still has 4 drains and some firm areas on the right side of her pubic area.  However, her drains are still in place and surgery recommended outpatient follow up at this time after a week.  The cultures are growing MRSA. Because of the extensive cellulitis and risk with her diabetes, she is being treated both with vancomycin and clindamycin orally.  She has a PICC line and will follow with ID, Dr. Ola Spurr, and also physician surgeon, Dr. Rexene Edison.  Home health will do her IV antibiotics and her daughter who is a Marine scientist, can help with the dressing changes.  2.  Diabetes mellitus. She  is on glipizide.  3.  Hypertension, on lisinopril and metoprolol.  4.  Gastroesophageal reflux disease on omeprazole.   5.  Obstructive sleep apnea, could not tolerate CPAP, however, her sats are around 88-89% so will discharge her on oxygen, especially will need nocturnal home O2.  6.  Her course has been otherwise uneventful in the hospital.   DISCHARGE CONDITION: Stable.   DISCHARGE DISPOSITION: Home with home health.   TIME SPENT ON DISCHARGE:  45 minutes.      ____________________________ Gladstone Lighter, MD rk:at D: 01/30/2015 15:10:52 ET T: 01/30/2015 19:48:07 ET JOB#: [id number]  cc: Gladstone Lighter, MD, <Dictator> Cheral Marker. Ola Spurr, MD Glena Norfolk. Rexene Edison,  MD  Gladstone Lighter MD ELECTRONICALLY SIGNED 02/01/2015 18:30

## 2015-03-18 NOTE — Discharge Summary (Signed)
PATIENT NAME:  Gabrielle Sampson, Gabrielle Sampson MR#:  [id number] DATE OF BIRTH:  10-27-1971  DATE OF ADMISSION:  02/05/2015 DATE OF DISCHARGE:  02/10/2015  DISCHARGE DIAGNOSIS: Pubic abscess.   PROCEDURES: Incision and drainage at the time of admission in the ED and repeat incision and drainage was done on March 23 by Dr. Pat Patrick.   CONSULTATIONS: Surgery, Dr. Pat Patrick and infectious disease, Dr. Ola Spurr.   CODE STATUS: Full code.   DISCHARGE MEDICATIONS: Omeprazole 40 mg once daily, lisinopril 10 mg p.o. once daily, gabapentin 300 mg p.o. once daily at bedtime, citalopram 20 mg 1 tablet p.o. once daily, bupropion 300 mg 1 tablet p.o. once daily at bedtime, metoprolol tartrate 50 mg p.o. b.i.d., glipizide 10 mg 1 tablet p.o. once daily, clindamycin 300 mg p.o. every 8 hours for 10 more days, gabapentin 300 mg 2 capsules p.o. once daily in the a.m., Tylenol with oxycodone 325/5 one tablet p.o. every 4 hours as needed for headache and moderate pain and 2 tablets as needed for severe pain, Tylenol 325 mg 1 to 2 tablets every 4 hours as needed for temperature greater than 100.4 or mild pain, Colace 100 mg p.o. 2 times a day as needed for constipation, Florastor 250 mg p.o. 2 times a day for 10 days. Discontinue vancomycin.   DIET: Low fat, low sodium, ADA diet, regular consistency.   ACTIVITY: As tolerated.   FOLLOWUP: With primary care physician in 1 week. Dr. Ola Spurr in 3 weeks. Surgery on 03/31 for drain removal; discharged home with 5 drains as recommended by surgery.   BRIEF HISTORY AND PHYSICAL: The patient is a 44 year old, Caucasian female who came into the ED with a chief complaint of pain and purulent discharge from the suprapubic area. She was complaining of fever and weakness. The patient was just recently admitted to the hospital with abdominal wall abscess in the suprapubic area, discharged home with PICC line on IV vancomycin and clindamycin on 01/30/2015, coming back with worsening of the discharge,  fever and weakness. Please review history and physical for details.   HOSPITAL COURSE:  1. Suprapubic abscess with cellulitis. The patient had incision and drainage. She was IV vancomycin and oral clindamycin. She came into the ED as she had worsening symptoms. From the previous cultures, she had MRSA. Vancomycin level was ordered at the time of admission. Continued PICC line. Dr. Ola Spurr was consulted. CAT scan was done which has revealed no drainable fluid. Surgical consult was also placed. The patient was continued on IV vancomycin and clindamycin, as recommended by infectious disease, Dr. Ola Spurr. Surgical consult was placed as the abscess was angry looking and still had fluctuance. As the clinical situation was not getting better, she had repeat incision and drainage done by Dr. Pat Patrick on 03/23. Five drains were kept following which she had a significant amount of purulent discharge drained from the abscessed area. The patient's pain, redness and swelling was significantly improved after incision and drainage. IV antibiotics, vancomycin and clindamycin was continued. Dr. Ola Spurr has recommended to discontinue IV vancomycin and PICC line at the time of discharge. He has recommended the patient to continue p.o. clindamycin as prescribed before for another 10 days. A prescription was given during the previous admission. The patient has admitted that she still has 10 days worth of clindamycin from the previous admission. Clinically, she is feeling better and started ambulating in the hallway. Decision was made to discharge the patient with p.o. clindamycin and discontinue IV vancomycin as recommended by Dr. Ola Spurr. Dr.  Pat Patrick has recommended the patient to follow up with him on 03/31 for follow-up care and removal of the drains.  2. Diabetes mellitus. The plan is to continue home medications and sliding scale insulin.  3. Hypertension. Continue home medications of lisinopril. 4. Anemia of chronic  disease, stable.   Discharged home in satisfactory condition.   PHYSICAL EXAMINATION: VITAL SIGNS: On the day of discharge, temperature 98.1, pulse 68, respirations 20, blood pressure 133/80, pulse oximetry 94% to 96% on room air.  GENERAL APPEARANCE: Not in acute distress. Morbidly obese.   HEENT: Normocephalic, atraumatic. Pupils are equally reactive to light and accommodation. No scleral icterus. No conjunctival injection. No sinus tenderness. Moist mucous membranes.  NECK: Supple. No JVD. No thyromegaly.  LUNGS: Clear to auscultation bilaterally. No accessory muscle use.  No anterior chest wall tenderness on palpation.  CARDIAC: S1, S2 normal. Regular rate and rhythm.  GASTROINTESTINAL: Soft, obese. Bowel sounds are positive in all 4 quadrants. In the suprapubic area, indurated abscess is present with 5 drains in place. No significant amount of drainage is noticed. Tenderness is less. Erythema is less.  EXTREMITIES: No cyanosis. No clubbing. No ulcers. PSYCHIATRY: Normal mood and affect.  NEUROLOGIC: Awake, alert and oriented x 3. Reflexes are 2+. Grossly intact.  LABORATORIES AND IMAGING STUDIES: WBC 8.1, hemoglobin 8.8, hematocrit 28.2, platelets are 244,000 on 03/24. BMP: Glucose 107. The rest of the BMP is fine. Calcium at 8.7.   Repeat cultures need to be followed up by Dr. Ola Spurr and primary care physician. The plan of care was discussed with the patient and her family and they all verbalized understanding of the plan. Discontinued PICC line at the time of discharge.  TOTAL TIME SPENT: On the discharge is 45 minutes.    ____________________________ Nicholes Mango, MD ag:TT D: 02/10/2015 16:18:11 ET T: 02/10/2015 17:15:21 ET JOB#: [id number]  cc: Nicholes Mango, MD, <Dictator> Micheline Maze, MD Cheral Marker. Ola Spurr, MD Nicholes Mango MD ELECTRONICALLY SIGNED 02/16/2015 13:23

## 2015-03-18 NOTE — Consult Note (Signed)
PATIENT NAME:  Gabrielle Sampson, Gabrielle Sampson MR#:  [id number] DATE OF BIRTH:  07-30-1971  DATE OF CONSULTATION:  02/05/2015  REFERRING PHYSICIAN:  Srikar R. Sudini, MD  CONSULTING PHYSICIAN:  Cheral Marker. Ola Spurr, MD  REASON FOR CONSULTATION: Pelvic cellulitis.   HISTORY OF PRESENT ILLNESS: This is a pleasant 44 year old female who we initially saw on March 14 with MRSA infection of her pubic area. She has a history of well-controlled diabetes as well as morbid obesity. She was admitted March 19 with an abscess. At [facility], she underwent incision and drainage by Dr. Rexene Edison, March 11. Cultures grew MRSA. She was discharged on March 15 on IV vancomycin, as well as oral clindamycin. The patient reports she did relatively well until this past weekend. On Friday, she developed an itchy rash. Her vancomycin was held overnight. She then restarted it. She continued on the clindamycin; however, she developed some increasing pain and redness at the site. We are consulted for further antibiotic management.   PAST MEDICAL HISTORY: 1. Morbid obesity.  2. Diabetes, well controlled.  3. Chronic pancreatitis.  4. Anxiety.  5. Depression.  6. Hypertriglyceridemia.   PAST SURGICAL HISTORY: Cholecystectomy, tubal ligation, C-section, right knee surgery, tonsillectomy, adenoidectomy,  uterine operation, AAA repair.   SOCIAL HISTORY: Does not smoke or drink. 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 Lives with her daughters.   FAMILY HISTORY: Noncontributory.   ALLERGIES: BETADINE CODEINE, IODINE, AND MORPHINE.   REVIEW OF SYSTEMS: Eleven systems reviewed and negative, except as per HPI.   ANTIBIOTICS: Vancomycin and clindamycin since admission.   PHYSICAL EXAMINATION: VITAL SIGNS: Temperature 97.5, pulse 64, blood pressure 128/73, respirations 20, saturation 95% on room air, temperature in the ED was 98.3.  GENERAL: She is morbidly obese, lying in bed, in no acute distress.  HEENT: Pupils reactive. Sclerae  anicteric. Oropharynx clear.  NECK: Supple.  HEART: Regular.  LUNGS: Clear.  ABDOMEN: Soft, obese.  She has, over her pubic area, some induration and erythema. She has 2 openings, one midline with some purulence in it and some surrounding induration. To the right, she also has an area of opening with some purulence and induration.  NEUROLOGIC: She is alert and oriented x 3. Grossly nonfocal neuro exam.   LABORATORY DATA: White blood count 9.6; of note, on admission, March 9, it was 11.8; hemoglobin 8.7, platelets 245,000. Vancomycin trough 19. Renal function is normal. LFTs are normal. A1c at last admission was 6.5.   IMAGING: CT of her abdomen and pelvis without contrast showed subcutaneous edema and inflammatory stranding in the anterior soft tissue at the level of the pubis, consistent with cellulitis. There is no evidence of abscess or undrained fluid collection.   IMPRESSION: A 44 year old female with history of methicillin-resistant Staphylococcus aureus infection of her pubic area with an abscess, status post drainage, March 11. She was readmitted due to worsening pain at the site. On my examination, the area does appear relatively stable, with even some slight improvement since discharge. White count is normal. She has no fever.   RECOMMENDATIONS: 1. Continue vancomycin and clindamycin.  2. Monitor her site. If she worsens or does not improve in the next day or 2, she will need further exploratory drainage.  3. If she continues to improve with antibiotics alone, I would discharge with another planned 10 days of IV antibiotics.  4. Thanks for the consult. I will be glad to follow with you     ____________________________ Cheral Marker. Ola Spurr, MD dpf:mw D: 02/05/2015 14:47:59  ET T: 02/05/2015 16:32:25 ET JOB#: [id number]  cc: Cheral Marker. Ola Spurr, MD, <Dictator> Nakeisha Greenhouse Ola Spurr MD ELECTRONICALLY SIGNED 02/08/2015 22:14

## 2015-03-18 NOTE — Consult Note (Signed)
Brief Consult Note: Diagnosis: Severe pubic abscess with MRSA.   Patient was seen by consultant.   Consult note dictated.   Recommend to proceed with surgery or procedure.   Recommend further assessment or treatment.   Orders entered.   Discussed with Attending MD.   Comments: Will need at least 7-10 more days of IV vanco Cont oral clinda as well Place Picc May need further surgical debridment.  Electronic Signatures: Angelena Form (MD)  (Signed 14-Mar-16 14:49)  Authored: Brief Consult Note   Last Updated: 14-Mar-16 14:49 by Angelena Form (MD)

## 2015-03-22 ENCOUNTER — Other Ambulatory Visit: Payer: Self-pay | Admitting: Family Medicine

## 2015-03-22 DIAGNOSIS — R1031 Right lower quadrant pain: Secondary | ICD-10-CM

## 2015-03-27 ENCOUNTER — Ambulatory Visit
Admission: RE | Admit: 2015-03-27 | Discharge: 2015-03-27 | Disposition: A | Discharge status: Home / Self Care | Payer: Managed Care, Other (non HMO) | Source: Ambulatory Visit | Attending: Family Medicine | Admitting: Family Medicine

## 2015-03-27 DIAGNOSIS — R1031 Right lower quadrant pain: Secondary | ICD-10-CM | POA: Diagnosis not present

## 2015-03-27 DIAGNOSIS — R599 Enlarged lymph nodes, unspecified: Secondary | ICD-10-CM | POA: Insufficient documentation

## 2015-05-22 IMAGING — US US EXTREM LOW*R* LIMITED
1 series · 14 of 25 positions shown · non-contrast
Comparison: None.

CLINICAL DATA: Right inguinal pain for 2 months.

EXAM:
ULTRASOUND right LOWER EXTREMITY LIMITED
TECHNIQUE: Ultrasound examination of the lower extremity soft tissues was
performed in the area of clinical concern.

[Series 1: us extrem low*right* limited · 0.06mm/px · 14 of 67 slices shown]
[im 1/67]
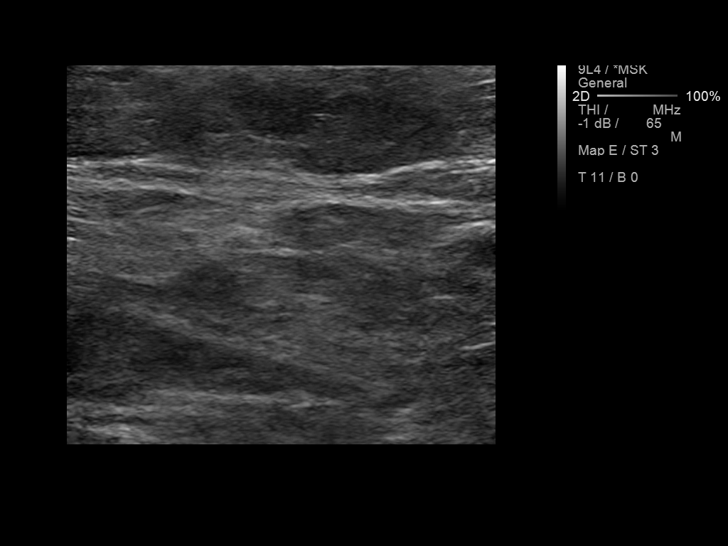
[im 6/67]
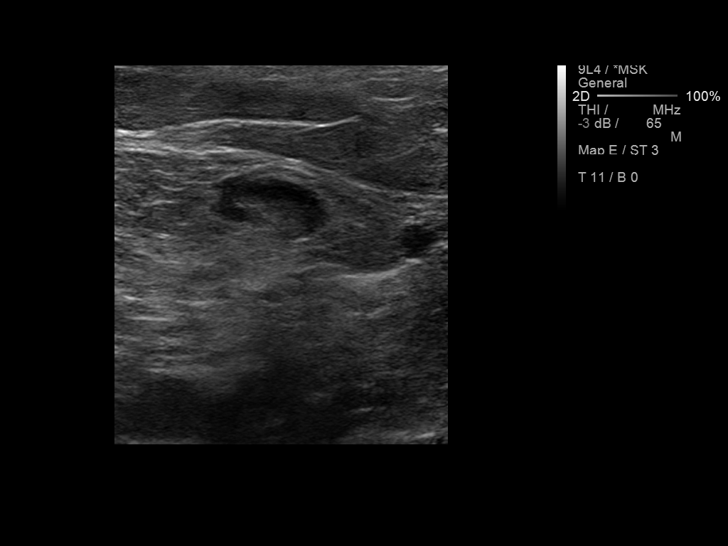
[im 12/67]
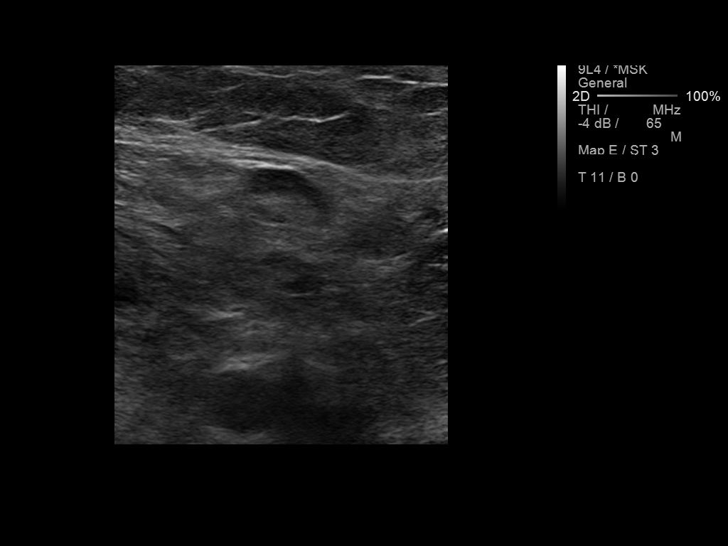
[im 17/67]
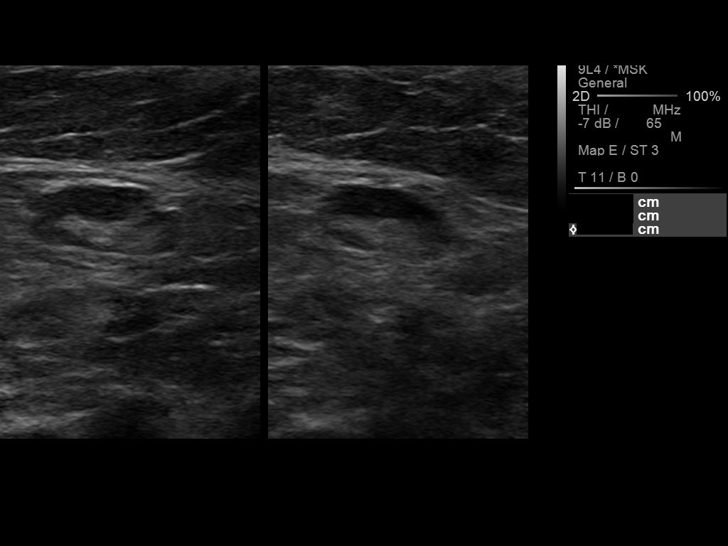
[im 23/67]
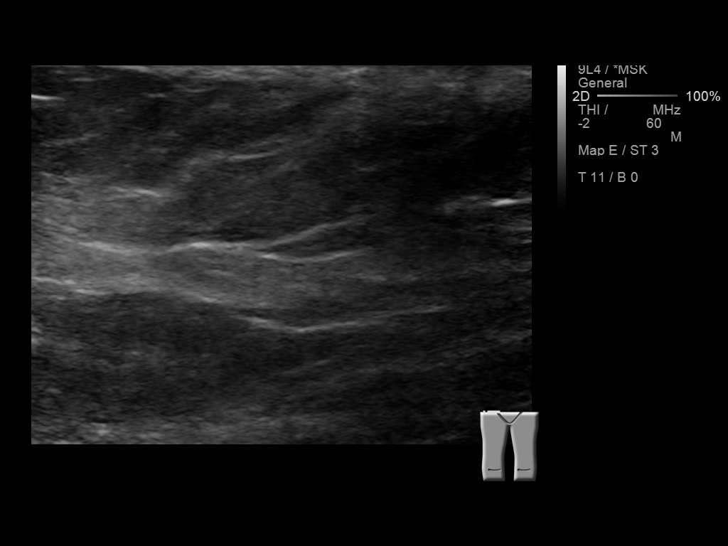
[im 25/67]
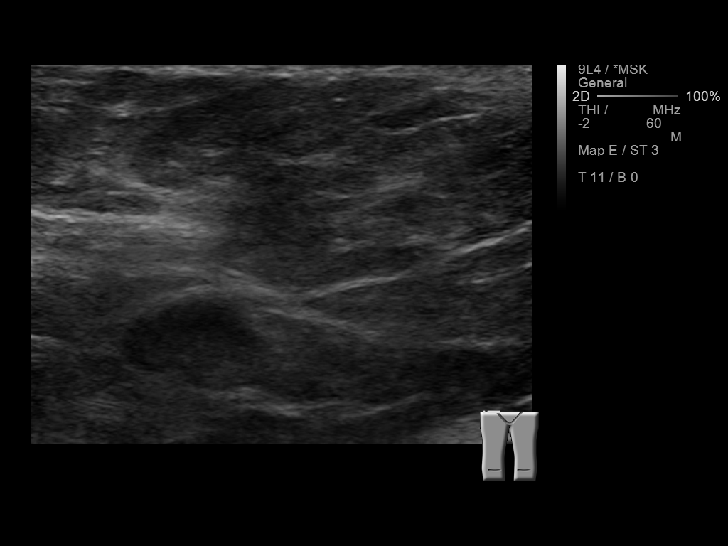
[im 31/67]
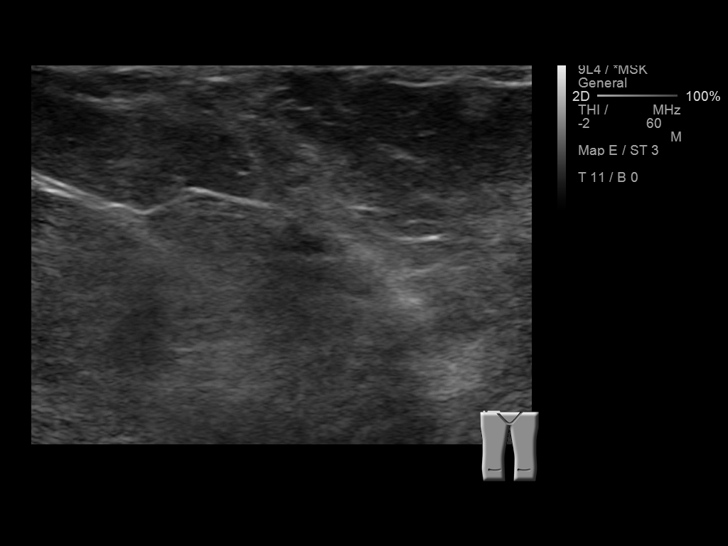
[im 36/67]
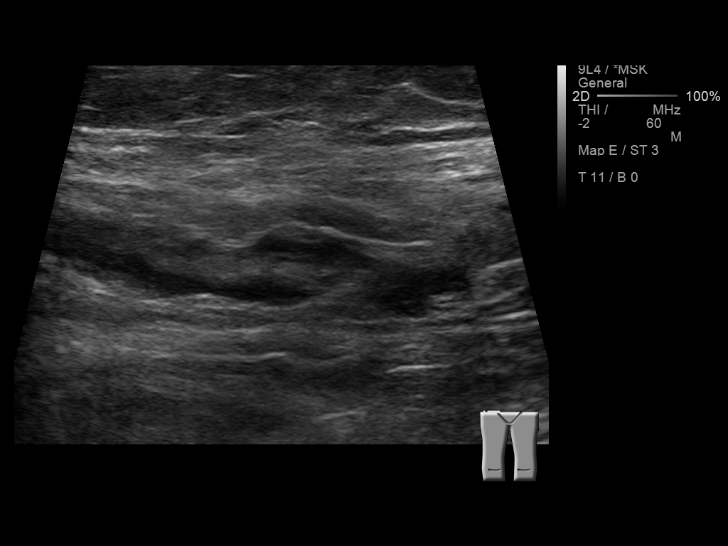
[im 42/67]
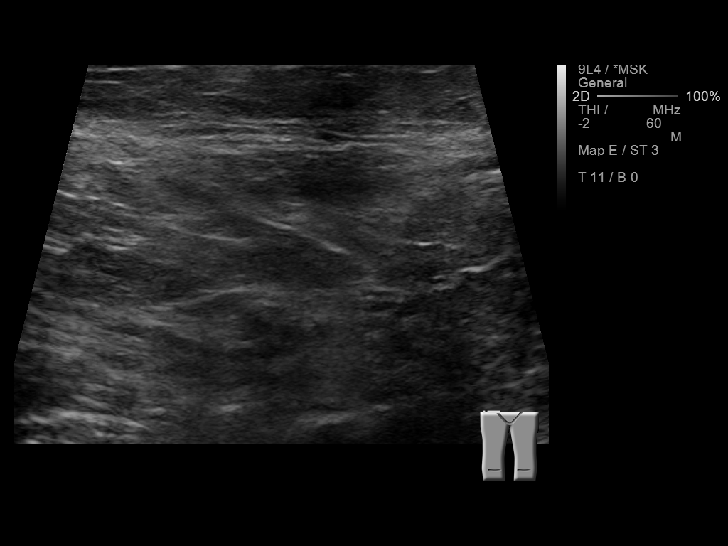
[im 45/67]
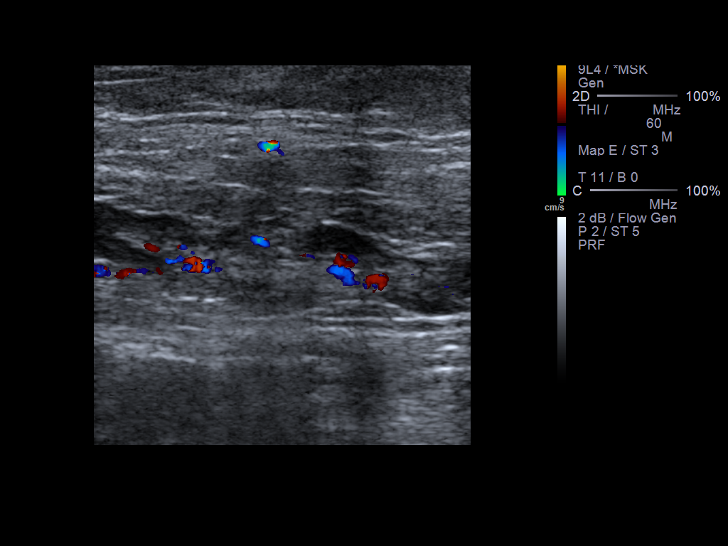
[im 50/67]
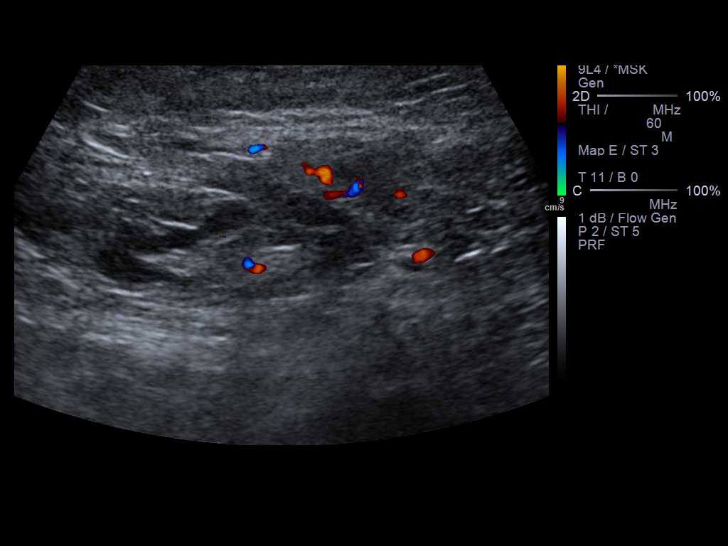
[im 56/67]
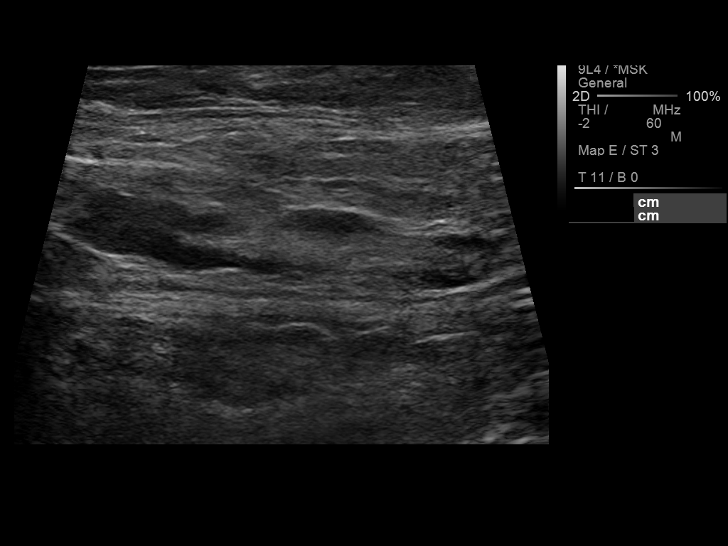
[im 61/67]
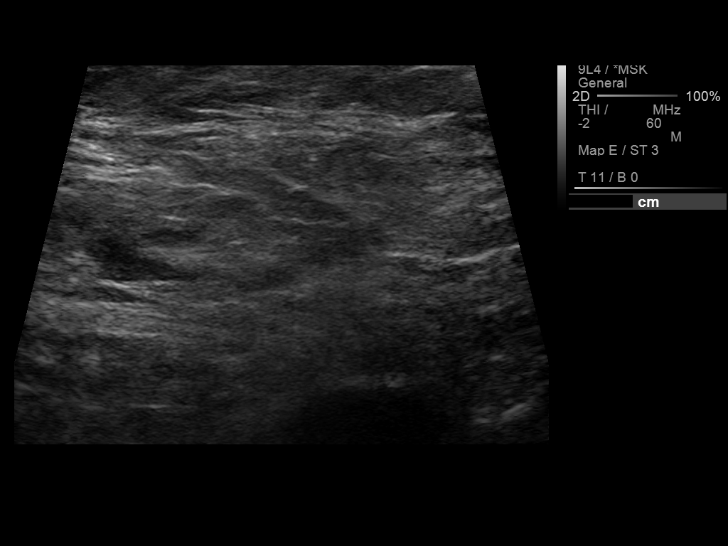
[im 67/67]
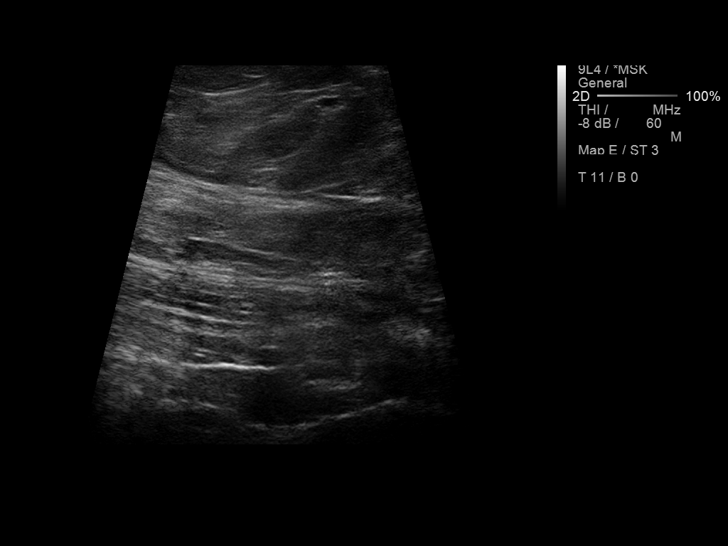

[14 of 25 positions shown; findings below may reference images not displayed]

FINDINGS: Multiple lymph nodes are noted in the right inguinal region, with
the largest measuring 4.3 x 3.0 x 0.8 cm. No other mass or fluid
collection is noted.
IMPRESSION: Multiple enlarged lymph nodes are noted which are all less than 1 cm
in minor axis and therefore benign. No other abnormality seen in the
right inguinal region.

## 2015-06-13 ENCOUNTER — Ambulatory Visit
Admission: RE | Admit: 2015-06-13 | Discharge: 2015-06-13 | Disposition: A | Discharge status: Home / Self Care | Payer: Medicaid Other | Source: Ambulatory Visit | Attending: Family Medicine | Admitting: Family Medicine

## 2015-06-13 ENCOUNTER — Other Ambulatory Visit: Payer: Self-pay | Admitting: Family Medicine

## 2015-06-13 DIAGNOSIS — R05 Cough: Secondary | ICD-10-CM | POA: Diagnosis present

## 2015-06-13 DIAGNOSIS — R059 Cough, unspecified: Secondary | ICD-10-CM

## 2015-06-13 DIAGNOSIS — R918 Other nonspecific abnormal finding of lung field: Secondary | ICD-10-CM | POA: Insufficient documentation

## 2015-06-13 DIAGNOSIS — J180 Bronchopneumonia, unspecified organism: Secondary | ICD-10-CM

## 2015-06-14 ENCOUNTER — Emergency Department: Payer: Medicaid Other

## 2015-06-14 ENCOUNTER — Emergency Department
Admission: EM | Admit: 2015-06-14 | Discharge: 2015-06-14 | Disposition: A | Discharge status: Home / Self Care | Payer: Medicaid Other | Attending: Emergency Medicine | Admitting: Emergency Medicine

## 2015-06-14 ENCOUNTER — Encounter: Payer: Self-pay | Admitting: Emergency Medicine

## 2015-06-14 DIAGNOSIS — J9801 Acute bronchospasm: Secondary | ICD-10-CM | POA: Insufficient documentation

## 2015-06-14 DIAGNOSIS — E119 Type 2 diabetes mellitus without complications: Secondary | ICD-10-CM | POA: Diagnosis not present

## 2015-06-14 DIAGNOSIS — J159 Unspecified bacterial pneumonia: Secondary | ICD-10-CM | POA: Insufficient documentation

## 2015-06-14 DIAGNOSIS — J189 Pneumonia, unspecified organism: Secondary | ICD-10-CM

## 2015-06-14 DIAGNOSIS — Z87891 Personal history of nicotine dependence: Secondary | ICD-10-CM | POA: Insufficient documentation

## 2015-06-14 DIAGNOSIS — Z79899 Other long term (current) drug therapy: Secondary | ICD-10-CM | POA: Insufficient documentation

## 2015-06-14 DIAGNOSIS — R0602 Shortness of breath: Secondary | ICD-10-CM | POA: Diagnosis present

## 2015-06-14 LAB — CBC
HEMATOCRIT: 35.6 % (ref 35.0–47.0)
HEMOGLOBIN: 11.4 g/dL — AB (ref 12.0–16.0)
MCH: 26.3 pg (ref 26.0–34.0)
MCHC: 32.1 g/dL (ref 32.0–36.0)
MCV: 82 fL (ref 80.0–100.0)
Platelets: 289 10*3/uL (ref 150–440)
RBC: 4.34 MIL/uL (ref 3.80–5.20)
RDW: 15.5 % — ABNORMAL HIGH (ref 11.5–14.5)
WBC: 13.5 10*3/uL — ABNORMAL HIGH (ref 3.6–11.0)

## 2015-06-14 LAB — BASIC METABOLIC PANEL
ANION GAP: 7 (ref 5–15)
BUN: 21 mg/dL — AB (ref 6–20)
CALCIUM: 9.1 mg/dL (ref 8.9–10.3)
CHLORIDE: 99 mmol/L — AB (ref 101–111)
CO2: 27 mmol/L (ref 22–32)
Creatinine, Ser: 0.67 mg/dL (ref 0.44–1.00)
GFR calc Af Amer: >60 mL/min (ref >60)
GFR calc non Af Amer: >60 mL/min (ref >60)
Glucose, Bld: 314 mg/dL — ABNORMAL HIGH (ref 65–99)
Potassium: 4.2 mmol/L (ref 3.5–5.1)
Sodium: 133 mmol/L — ABNORMAL LOW (ref 135–145)

## 2015-06-14 LAB — FIBRIN DERIVATIVES D-DIMER (ARMC ONLY): Fibrin derivatives D-dimer (ARMC): 259 (ref 0–499)

## 2015-06-14 LAB — TROPONIN I

## 2015-06-14 LAB — GLUCOSE, CAPILLARY: Glucose-Capillary: 228 mg/dL — ABNORMAL HIGH (ref 65–99)

## 2015-06-14 MED ORDER — PREDNISONE 20 MG PO TABS
40.0000 mg | ORAL_TABLET | Freq: Once | ORAL | Status: AC
  Administered 2015-06-14: 40 mg via ORAL
  Filled 2015-06-14: qty 2

## 2015-06-14 MED ORDER — IPRATROPIUM-ALBUTEROL 0.5-2.5 (3) MG/3ML IN SOLN
3.0000 mL | Freq: Once | RESPIRATORY_TRACT | Status: AC
  Administered 2015-06-14: 3 mL via RESPIRATORY_TRACT
  Filled 2015-06-14: qty 3

## 2015-06-14 MED ORDER — INSULIN ASPART 100 UNIT/ML ~~LOC~~ SOLN
4.0000 [IU] | Freq: Once | SUBCUTANEOUS | Status: AC
  Administered 2015-06-14: 4 [IU] via SUBCUTANEOUS

## 2015-06-14 MED ORDER — INSULIN ASPART 100 UNIT/ML ~~LOC~~ SOLN
SUBCUTANEOUS | Status: AC
  Administered 2015-06-14: 4 [IU] via SUBCUTANEOUS
  Filled 2015-06-14: qty 4

## 2015-06-14 NOTE — ED Notes (Signed)
Pt reports that she has pneumonia.  Pt had outpt cxr today.  Pt states she became more sob.  No chest pain but has tightness.  No n/v/d.  No diaphoresis.  Pt is on 2liters oxygen at night at home.    md at bedside on arrival to treatment room.  Family also at bedside.

## 2015-06-14 NOTE — ED Provider Notes (Signed)
Workup negative. Vital signs stable and normal. No distress on discharge home  Carrie Mew, MD 06/14/15 [phone]

## 2015-06-14 NOTE — ED Notes (Signed)
Pt ambulated up and down the hallway with continuous pulse ox and HR monitor attached. Pt maintained a HR of 95-99 bpm and an O2 sat of between 96-99%.

## 2015-06-14 NOTE — ED Provider Notes (Signed)
[facility] Emergency Department Provider Note  ____________________________________________  Time seen: Approximately 3:32 AM  I have reviewed the triage vital signs and the nursing notes.   HISTORY  Chief Complaint Cough and Shortness of Breath    HPI Gabrielle Sampson is a 44 y.o. female history of hypertension, diabetes, morbid obesitypresents today for concerns of ongoing cough and shortness of breath. Patient reports that she is had about one week of cough, occasional wheezing, and feeling of shortness of breath. She saw her doctor today and was prescribed azithromycin which she started as well as a prescription of prednisone but she has not started the prednisone yet. She is also been given inhalers which she has been using at home. She reports that her symptoms seem to be slightly worse tonight and she came in is her primary who advised her that if her symptoms were worsening to come to the emergency room.  She describes about one week of feeling of tightness across her chest that does not radiate. Pain is nonexertional. This been fairly constant for approximately one week.  No recent trauma, surgery, hospitalization, no bloody cough, no history of cancer or malignancy.  Past Medical History  Diagnosis Date  . Diabetes   . Osteoarthritis   . Fatigue   . HBP (high blood pressure)   . Reflux   . Anxiety   . Pancreatitis    Patient reports that about 20 years ago she may have had a blood clot in her right leg while she was pregnant, though she does not recall well and is unsure if she ever was on any blood thinners.  Patient Active Problem List   Diagnosis Date Noted  . ABDOMINAL PAIN, EPIGASTRIC 06/18/2007  . PANCREATITIS 05/10/2007  . URTICARIA 05/10/2007  . ANXIETY 04/29/2007  . DEPRESSION 04/29/2007  . HYPERTENSION 04/29/2007  . DERMATOFIBROMA 02/22/2007  . IRRITABLE BOWEL SYNDROME 02/22/2007  . SKIN TAG 02/22/2007  . SLEEP  DISORDER 02/22/2007    Past Surgical History  Procedure Laterality Date  . Cesarean section  [place]  . Gallbladder surgery  1996  . Knee surgery Right 2012  . Tonsillectomy and adenoidectomy      Current Outpatient Rx  Name  Route  Sig  Dispense  Refill  . albuterol (PROAIR HFA) 108 (90 BASE) MCG/ACT inhaler   Inhalation   Inhale 2 puffs into the lungs every 6 (six) hours as needed.         Marland Kitchen azithromycin (ZITHROMAX) 250 MG tablet   Oral   Take 1 tablet by mouth daily.         . citalopram (CELEXA) 40 MG tablet   Oral   Take 20 mg by mouth daily.         Marland Kitchen gabapentin (NEURONTIN) 400 MG capsule   Oral   Take 1 capsule by mouth 5 (five) times daily.      3   . glucose blood test strip   Other   1 each by Other route as needed for other. Use as instructed         . meloxicam (MOBIC) 15 MG tablet   Oral   Take 1 tablet by mouth daily.         . predniSONE (DELTASONE) 10 MG tablet   Oral   Take 1 tablet by mouth taper from 4 doses each day to 1 dose and stop.           Allergies Sulfa antibiotics; Betadine;  Codeine phosphate; Iodine; and Morphine and related  No family history on file.  Social History History  Substance Use Topics  . Smoking status: Former Smoker    Types: Cigarettes    Quit date: 08/08/2013  . Smokeless tobacco: Never Used  . Alcohol Use: No    Review of Systems Constitutional: Temp of 100 Fahrenheit Eyes: No visual changes. ENT: No sore throat. Cardiovascular: See history of present illness Respiratory: See history of present illness Gastrointestinal: No abdominal pain.  No nausea, no vomiting.  No diarrhea.  No constipation. Genitourinary: Negative for dysuria. Musculoskeletal: Negative for back pain. Skin: Negative for rash. Denies leg swelling. Some occasional aching in her right calf intermittently without swelling. Neurological: Negative for headaches, focal weakness or numbness.  10-point ROS otherwise  negative.  ____________________________________________   PHYSICAL EXAM:  VITAL SIGNS: ED Triage Vitals  Enc Vitals Group     BP 06/14/15 0028 136/85 mmHg     Pulse Rate 06/14/15 0028 80     Resp 06/14/15 0028 18     Temp 06/14/15 0028 98 F (36.7 C)     Temp Source 06/14/15 0028 Oral     SpO2 06/14/15 0028 97 %     Weight 06/14/15 0028 320 lb (145.151 kg)     Height 06/14/15 0028 5\' 4"  (1.626 m)     Head Cir --      Peak Flow --      Pain Score 06/14/15 0028 6     Pain Loc --      Pain Edu? --      Excl. in [place]? --     Constitutional: Alert and oriented. Well appearing and in no acute distress. Eyes: Conjunctivae are normal. PERRL. EOMI. Head: Atraumatic. Nose: No congestion/rhinnorhea. Mouth/Throat: Mucous membranes are moist.  Oropharynx non-erythematous. Neck: No stridor.   Cardiovascular: Normal rate, regular rhythm. Grossly normal heart sounds.  Good peripheral circulation. Respiratory: Occasional dry cough. Normal respiratory effort.  No retractions. Lungs CTAB. Patient does report that she was wheezing earlier today but this seems to have gone away after being on nebulizer treatments which her primary care doctor gave her. At this time the patient has absolutely no evidence of increased work of breathing. Gastrointestinal: Soft and nontender. No distention. No abdominal bruits. No CVA tenderness. Musculoskeletal: No lower extremity tenderness nor edema.  No joint effusions. The patient does have fairly morbid obesity, but there is no evidence of edema cords or tenderness in the calves. Neurologic:  Normal speech and language. No gross focal neurologic deficits are appreciated. No gait instability. Skin:  Skin is warm, dry and intact. No rash noted. Psychiatric: Mood and affect are normal. Speech and behavior are normal.  ____________________________________________   LABS (all labs ordered are listed, but only abnormal results are displayed)  Labs Reviewed  BASIC  METABOLIC PANEL - Abnormal; Notable for the following:    Sodium 133 (*)    Chloride 99 (*)    Glucose, Bld 314 (*)    BUN 21 (*)    All other components within normal limits  CBC - Abnormal; Notable for the following:    WBC 13.5 (*)    Hemoglobin 11.4 (*)    RDW 15.5 (*)    All other components within normal limits  TROPONIN I  FIBRIN DERIVATIVES D-DIMER ([facility] ONLY)  TROPONIN I  CBG MONITORING, ED   ____________________________________________  EKG  Normal EKG  ED ECG REPORT I, Mete Purdum, the attending physician, personally viewed and  interpreted this ECG.  Date: 06/14/2015 EKG Time: 0032 Rate: 77 Rhythm: normal sinus rhythm QRS Axis: normal Intervals: normal ST/T Wave abnormalities: T wave inversions notable in lead 3, and less evident in lead aVF and V3 which may be normal variants Conduction Disutrbances: none Narrative Interpretation: NSR,  T wave inversions notable in lead 3, and less evident in lead aVF and V3 which may be normal variants  Compared with 17th of November 2013 does not appear to be any significant change.  ____________________________________________  RADIOLOGY     DG Chest 2 View (Final result) Result time: 06/14/15 02:54:04   Final result by Rad Results In Interface (06/14/15 02:54:04)   Narrative:   CLINICAL DATA: Dyspnea and chest tightness.  EXAM: CHEST 2 VIEW  COMPARISON: 06/13/2015  FINDINGS: There is lingular infiltrate, likely pneumonia. The right lung is clear. There is no effusion. Heart size is normal. Pulmonary vasculature is normal. Hilar and mediastinal contours are unremarkable and unchanged.  IMPRESSION: Lingular infiltrate, likely pneumonia. Followup PA and lateral chest X-ray is recommended in 3-4 weeks following trial of antibiotic therapy to ensure resolution and exclude underlying malignancy.    ____________________________________________   PROCEDURES  Procedure(s) performed: None  Critical  Care performed: No  ____________________________________________   INITIAL IMPRESSION / ASSESSMENT AND PLAN / ED COURSE  Pertinent labs & imaging results that were available during my care of the patient were reviewed by me and considered in my medical decision making (see chart for details).  The patient's clinical scenario and history most suggestive pneumonia with a lingular infiltrate cough and likely some associated bronchospasm. However, she does have a low risk well score of 1.5 and is experiencing some chest tightness and we will obtain Dopplers and a d-dimer to evaluate for the low but not impossible probability of pulmonary embolism. I do not believe she is suffering from acute coronary disease, her symptoms have been ongoing of chest tightness for a week and likely due to an element of bronchospasm. Her troponin is negative and EKG appears normal for her. She reports a previous cardiac catheterization and did not have significant coronary disease.  We will treat her with DuoNeb abs, and initiate prednisone and continue to observe her in the ER as we worked her up for etiology of chest tightness, which is likely secondary to bronchospasm as well as rule out pulmonary embolism or other acute concerns.  ----------------------------------------- 5:45 AM on 06/14/2015 -----------------------------------------  Discussed with the patient, she states she feels much better. She did a trending pulse ox with ambulation did not desat. She is presently resting comfortably and states her tightness is much better. We will check a second troponin, though I do not believe this to be cardiac related disease at this time, as well as Doppler ultrasounds to rule out DVTs. If these are negative plan would be to discharge the patient home. She will call her doctor to set up close follow-up either tomorrow or early this next week. She does have a small lingular infiltrate, but is on azithromycin which she has  just started and we will have her continue on her previously prescribed inhaler and prednisone taper by her PCP. My impression is the patient is likely suffering from a slight lingular infiltrate as well as mild bronchospasm.  I also discussed with the patient that her blood sugars over 300. She is going to continue her glipizide treatment and will check her blood sugars daily. I advised her that if her blood sugars are elevated  over 300 she needs to contact her primary care doctor, and if she develops symptoms of nausea vomiting weakness confusion, or feels dehydrated that she should come to the ER. She is agreeable with this plan. I will give her a slight amount of insulin here and we will recheck her sugars thereafter.  Close return precautions were discussed with the patient is very agreeable. ____________________________________________   FINAL CLINICAL IMPRESSION(S) / ED DIAGNOSES  Final diagnoses:  Community acquired pneumonia  Bronchospasm   Care and disposition assigned to Dr. Joni Fears at 7:05 AM. Plan is to follow-up on lower extremity Doppler to evaluate for DVT, and repeat blood sugar. Plan is to discharge the patient if she remains well, no DVT, and once blood sugar has improved to under 300.   Delman Kitten, MD 06/14/15 [phone]

## 2015-06-14 NOTE — ED Notes (Addendum)
After 15 minutes of being on room air, the pt's O2 sats are noted to be 96% with a resting HR of 76.

## 2015-06-14 NOTE — ED Notes (Addendum)
Pt to triage via w/c with no distress noted; st seen PCP yesterday and dx with pneumonia; c/o chest tightness and SOB with productive cough yellow sputum; rx zpak, prednisone and inhaler--took ds at 8pm

## 2015-06-14 NOTE — Discharge Instructions (Signed)

## 2015-06-23 DIAGNOSIS — E1142 Type 2 diabetes mellitus with diabetic polyneuropathy: Secondary | ICD-10-CM | POA: Insufficient documentation

## 2015-06-23 DIAGNOSIS — M7061 Trochanteric bursitis, right hip: Secondary | ICD-10-CM | POA: Insufficient documentation

## 2015-06-25 LAB — PROTIME-INR
INR: 1.1
PROTHROMBIN TIME: 14.3 s

## 2015-06-25 LAB — CBC WITH DIFFERENTIAL/PLATELET
BASOS ABS: 0.1 10*3/uL (ref 0.0–0.1)
Basophil %: 0.8 %
Eosinophil #: 0.2 10*3/uL (ref 0.0–0.7)
Eosinophil %: 2.5 %
HCT: 28.6 % — AB (ref 35.0–47.0)
HGB: 9.1 g/dL — ABNORMAL LOW (ref 12.0–16.0)
Lymphocyte #: 1.6 10*3/uL (ref 1.0–3.6)
Lymphocyte %: 19.9 %
MCH: 26.5 pg (ref 26.0–34.0)
MCHC: 31.9 g/dL — ABNORMAL LOW (ref 32.0–36.0)
MCV: 83 fL (ref 80–100)
Monocyte #: 0.5 x10 3/mm (ref 0.2–0.9)
Monocyte %: 5.5 %
Neutrophil #: 5.9 10*3/uL (ref 1.4–6.5)
Neutrophil %: 71.3 %
PLATELETS: 234 10*3/uL (ref 150–440)
RBC: 3.43 10*6/uL — AB (ref 3.80–5.20)
RDW: 15.2 % — ABNORMAL HIGH (ref 11.5–14.5)
WBC: 8.2 10*3/uL (ref 3.6–11.0)

## 2015-06-25 LAB — VANCOMYCIN, TROUGH: Vancomycin, Trough: 21 ug/mL

## 2015-06-25 LAB — PREGNANCY, URINE: Pregnancy Test, Urine: NEGATIVE m[IU]/mL

## 2015-07-13 DIAGNOSIS — J45909 Unspecified asthma, uncomplicated: Secondary | ICD-10-CM | POA: Insufficient documentation

## 2015-08-10 ENCOUNTER — Other Ambulatory Visit: Payer: Self-pay | Admitting: Family Medicine

## 2015-08-10 ENCOUNTER — Ambulatory Visit
Admission: RE | Admit: 2015-08-10 | Discharge: 2015-08-10 | Disposition: A | Discharge status: Home / Self Care | Payer: Medicaid Other | Source: Ambulatory Visit | Attending: Family Medicine | Admitting: Family Medicine

## 2015-08-10 ENCOUNTER — Ambulatory Visit: Payer: Managed Care, Other (non HMO)

## 2015-08-10 DIAGNOSIS — I8393 Asymptomatic varicose veins of bilateral lower extremities: Secondary | ICD-10-CM

## 2015-08-10 DIAGNOSIS — M79605 Pain in left leg: Secondary | ICD-10-CM

## 2015-08-10 DIAGNOSIS — M79604 Pain in right leg: Secondary | ICD-10-CM | POA: Insufficient documentation

## 2015-09-04 ENCOUNTER — Ambulatory Visit: Payer: Medicaid Other | Attending: Specialist

## 2015-09-04 DIAGNOSIS — G4733 Obstructive sleep apnea (adult) (pediatric): Secondary | ICD-10-CM | POA: Insufficient documentation

## 2015-09-04 DIAGNOSIS — G4761 Periodic limb movement disorder: Secondary | ICD-10-CM | POA: Insufficient documentation

## 2015-09-07 DIAGNOSIS — K219 Gastro-esophageal reflux disease without esophagitis: Secondary | ICD-10-CM | POA: Insufficient documentation

## 2015-09-28 ENCOUNTER — Inpatient Hospital Stay
Admission: EM | Admit: 2015-09-28 | Discharge: 2015-09-30 | DRG: 916 | Disposition: A | Discharge status: Home / Self Care | Payer: Medicaid Other | Attending: Internal Medicine | Admitting: Internal Medicine

## 2015-09-28 ENCOUNTER — Encounter: Payer: Self-pay | Admitting: Emergency Medicine

## 2015-09-28 DIAGNOSIS — M1990 Unspecified osteoarthritis, unspecified site: Secondary | ICD-10-CM | POA: Diagnosis present

## 2015-09-28 DIAGNOSIS — E119 Type 2 diabetes mellitus without complications: Secondary | ICD-10-CM

## 2015-09-28 DIAGNOSIS — Z808 Family history of malignant neoplasm of other organs or systems: Secondary | ICD-10-CM

## 2015-09-28 DIAGNOSIS — Z794 Long term (current) use of insulin: Secondary | ICD-10-CM

## 2015-09-28 DIAGNOSIS — Z79899 Other long term (current) drug therapy: Secondary | ICD-10-CM | POA: Diagnosis not present

## 2015-09-28 DIAGNOSIS — Z888 Allergy status to other drugs, medicaments and biological substances status: Secondary | ICD-10-CM

## 2015-09-28 DIAGNOSIS — Z6841 Body Mass Index (BMI) 40.0 and over, adult: Secondary | ICD-10-CM | POA: Diagnosis not present

## 2015-09-28 DIAGNOSIS — Z9889 Other specified postprocedural states: Secondary | ICD-10-CM | POA: Diagnosis not present

## 2015-09-28 DIAGNOSIS — Z882 Allergy status to sulfonamides status: Secondary | ICD-10-CM

## 2015-09-28 DIAGNOSIS — K219 Gastro-esophageal reflux disease without esophagitis: Secondary | ICD-10-CM | POA: Diagnosis present

## 2015-09-28 DIAGNOSIS — I10 Essential (primary) hypertension: Secondary | ICD-10-CM

## 2015-09-28 DIAGNOSIS — F410 Panic disorder [episodic paroxysmal anxiety] without agoraphobia: Secondary | ICD-10-CM | POA: Diagnosis present

## 2015-09-28 DIAGNOSIS — E118 Type 2 diabetes mellitus with unspecified complications: Secondary | ICD-10-CM

## 2015-09-28 DIAGNOSIS — Z91041 Radiographic dye allergy status: Secondary | ICD-10-CM

## 2015-09-28 DIAGNOSIS — E669 Obesity, unspecified: Secondary | ICD-10-CM

## 2015-09-28 DIAGNOSIS — Z886 Allergy status to analgesic agent status: Secondary | ICD-10-CM | POA: Diagnosis not present

## 2015-09-28 DIAGNOSIS — Z8249 Family history of ischemic heart disease and other diseases of the circulatory system: Secondary | ICD-10-CM

## 2015-09-28 DIAGNOSIS — R1310 Dysphagia, unspecified: Secondary | ICD-10-CM

## 2015-09-28 DIAGNOSIS — T464X5A Adverse effect of angiotensin-converting-enzyme inhibitors, initial encounter: Secondary | ICD-10-CM | POA: Diagnosis present

## 2015-09-28 DIAGNOSIS — Z87891 Personal history of nicotine dependence: Secondary | ICD-10-CM | POA: Diagnosis not present

## 2015-09-28 DIAGNOSIS — F329 Major depressive disorder, single episode, unspecified: Secondary | ICD-10-CM | POA: Diagnosis present

## 2015-09-28 DIAGNOSIS — T783XXA Angioneurotic edema, initial encounter: Principal | ICD-10-CM | POA: Diagnosis present

## 2015-09-28 HISTORY — DX: Morbid (severe) obesity due to excess calories: E66.01

## 2015-09-28 LAB — COMPREHENSIVE METABOLIC PANEL
ALK PHOS: 92 U/L (ref 38–126)
ALT: 12 U/L — ABNORMAL LOW (ref 14–54)
ANION GAP: 9 (ref 5–15)
AST: 32 U/L (ref 15–41)
Albumin: 3.1 g/dL — ABNORMAL LOW (ref 3.5–5.0)
BILIRUBIN TOTAL: 0.7 mg/dL (ref 0.3–1.2)
BUN: 9 mg/dL (ref 6–20)
CALCIUM: 8.9 mg/dL (ref 8.9–10.3)
CO2: 26 mmol/L (ref 22–32)
Chloride: 101 mmol/L (ref 101–111)
Creatinine, Ser: 0.44 mg/dL (ref 0.44–1.00)
GFR calc Af Amer: >60 mL/min (ref >60)
GLUCOSE: 119 mg/dL — AB (ref 65–99)
POTASSIUM: 3.9 mmol/L (ref 3.5–5.1)
Sodium: 136 mmol/L (ref 135–145)
TOTAL PROTEIN: 7.3 g/dL (ref 6.5–8.1)

## 2015-09-28 LAB — CBC
HEMATOCRIT: 34.3 % — AB (ref 35.0–47.0)
HEMOGLOBIN: 11.3 g/dL — AB (ref 12.0–16.0)
MCH: 27.9 pg (ref 26.0–34.0)
MCHC: 33 g/dL (ref 32.0–36.0)
MCV: 84.4 fL (ref 80.0–100.0)
Platelets: 255 10*3/uL (ref 150–440)
RBC: 4.06 MIL/uL (ref 3.80–5.20)
RDW: 13.8 % (ref 11.5–14.5)
WBC: 8.4 10*3/uL (ref 3.6–11.0)

## 2015-09-28 LAB — GLUCOSE, CAPILLARY
GLUCOSE-CAPILLARY: 165 mg/dL — AB (ref 65–99)
Glucose-Capillary: 149 mg/dL — ABNORMAL HIGH (ref 65–99)

## 2015-09-28 LAB — MRSA PCR SCREENING: MRSA BY PCR: NEGATIVE

## 2015-09-28 MED ORDER — ALPRAZOLAM 0.5 MG PO TABS: 0.5000 mg | ORAL_TABLET | Freq: Two times a day (BID) | ORAL | Status: DC | PRN

## 2015-09-28 MED ORDER — INSULIN ASPART 100 UNIT/ML ~~LOC~~ SOLN: 0.0000 [IU] | Freq: Every day | SUBCUTANEOUS | Status: DC

## 2015-09-28 MED ORDER — CYANOCOBALAMIN 1000 MCG/ML IJ SOLN
1000.0000 ug | INTRAMUSCULAR | Status: DC
  Filled 2015-09-28: qty 1

## 2015-09-28 MED ORDER — INSULIN ASPART 100 UNIT/ML ~~LOC~~ SOLN
0.0000 [IU] | Freq: Three times a day (TID) | SUBCUTANEOUS | Status: DC
  Administered 2015-09-29: 5 [IU] via SUBCUTANEOUS
  Administered 2015-09-29: 3 [IU] via SUBCUTANEOUS
  Administered 2015-09-30: 2 [IU] via SUBCUTANEOUS
  Filled 2015-09-28: qty 3
  Filled 2015-09-28: qty 2
  Filled 2015-09-28: qty 5

## 2015-09-28 MED ORDER — METHYLPREDNISOLONE SODIUM SUCC 40 MG IJ SOLR
40.0000 mg | Freq: Two times a day (BID) | INTRAMUSCULAR | Status: DC
Start: 2015-09-28 — End: 2015-09-30
  Administered 2015-09-28 – 2015-09-30 (×4): 40 mg via INTRAVENOUS
  Filled 2015-09-28 (×4): qty 1

## 2015-09-28 MED ORDER — DIPHENHYDRAMINE HCL 25 MG PO CAPS
50.0000 mg | ORAL_CAPSULE | Freq: Four times a day (QID) | ORAL | Status: DC | PRN
  Administered 2015-09-29 (×2): 50 mg via ORAL
  Filled 2015-09-28 (×2): qty 2

## 2015-09-28 MED ORDER — ACETAMINOPHEN 325 MG PO TABS
650.0000 mg | ORAL_TABLET | Freq: Four times a day (QID) | ORAL | Status: DC | PRN
  Administered 2015-09-28: 650 mg via ORAL
  Filled 2015-09-28: qty 2

## 2015-09-28 MED ORDER — DIPHENHYDRAMINE HCL 50 MG/ML IJ SOLN
INTRAMUSCULAR | Status: AC
  Administered 2015-09-28: 25 mg via INTRAVENOUS
  Filled 2015-09-28: qty 1

## 2015-09-28 MED ORDER — SODIUM CHLORIDE 0.9 % IV SOLN
INTRAVENOUS | Status: AC
  Administered 2015-09-28: 17:00:00 via INTRAVENOUS

## 2015-09-28 MED ORDER — INSULIN GLARGINE 100 UNIT/ML ~~LOC~~ SOLN
5.0000 [IU] | Freq: Every day | SUBCUTANEOUS | Status: DC
  Administered 2015-09-28 – 2015-09-29 (×2): 5 [IU] via SUBCUTANEOUS
  Filled 2015-09-28 (×4): qty 0.05

## 2015-09-28 MED ORDER — DOCUSATE SODIUM 100 MG PO CAPS
100.0000 mg | ORAL_CAPSULE | Freq: Two times a day (BID) | ORAL | Status: DC
  Administered 2015-09-28: 100 mg via ORAL
  Filled 2015-09-28 (×4): qty 1

## 2015-09-28 MED ORDER — GLIPIZIDE 5 MG PO TABS
5.0000 mg | ORAL_TABLET | ORAL | Status: DC
  Administered 2015-09-29: 5 mg via ORAL
  Filled 2015-09-28 (×2): qty 1

## 2015-09-28 MED ORDER — DOCUSATE SODIUM 100 MG PO CAPS: 100.0000 mg | ORAL_CAPSULE | Freq: Two times a day (BID) | ORAL | Status: DC | PRN

## 2015-09-28 MED ORDER — METOPROLOL TARTRATE 50 MG PO TABS
50.0000 mg | ORAL_TABLET | Freq: Two times a day (BID) | ORAL | Status: DC
  Administered 2015-09-28 – 2015-09-30 (×4): 50 mg via ORAL
  Filled 2015-09-28 (×4): qty 1

## 2015-09-28 MED ORDER — CITALOPRAM HYDROBROMIDE 20 MG PO TABS
40.0000 mg | ORAL_TABLET | Freq: Every day | ORAL | Status: DC
  Administered 2015-09-29 – 2015-09-30 (×2): 40 mg via ORAL
  Filled 2015-09-28 (×2): qty 2

## 2015-09-28 MED ORDER — OXYMETAZOLINE HCL 0.05 % NA SOLN
NASAL | Status: AC
  Filled 2015-09-28: qty 15

## 2015-09-28 MED ORDER — LORAZEPAM 2 MG/ML IJ SOLN
0.5000 mg | INTRAMUSCULAR | Status: DC | PRN
  Administered 2015-09-28 – 2015-09-29 (×3): 1 mg via INTRAVENOUS
  Filled 2015-09-28 (×3): qty 1

## 2015-09-28 MED ORDER — METHYLPREDNISOLONE SODIUM SUCC 125 MG IJ SOLR
125.0000 mg | Freq: Once | INTRAMUSCULAR | Status: AC
  Administered 2015-09-28: 125 mg via INTRAVENOUS

## 2015-09-28 MED ORDER — PANTOPRAZOLE SODIUM 40 MG PO TBEC
40.0000 mg | DELAYED_RELEASE_TABLET | Freq: Every day | ORAL | Status: DC
  Administered 2015-09-29 – 2015-09-30 (×2): 40 mg via ORAL
  Filled 2015-09-28 (×2): qty 1

## 2015-09-28 MED ORDER — POLYETHYLENE GLYCOL 3350 17 G PO PACK: 17.0000 g | PACK | Freq: Every day | ORAL | Status: DC | PRN

## 2015-09-28 MED ORDER — LORAZEPAM 2 MG/ML IJ SOLN
0.5000 mg | INTRAMUSCULAR | Status: DC | PRN
  Administered 2015-09-28 (×2): 0.5 mg via INTRAVENOUS
  Filled 2015-09-28 (×2): qty 1

## 2015-09-28 MED ORDER — NORGESTIMATE-ETH ESTRADIOL 0.25-35 MG-MCG PO TABS: 1.0000 | ORAL_TABLET | Freq: Every day | ORAL | Status: DC

## 2015-09-28 MED ORDER — SODIUM CHLORIDE 0.9 % IJ SOLN
3.0000 mL | Freq: Two times a day (BID) | INTRAMUSCULAR | Status: DC
  Administered 2015-09-28 – 2015-09-29 (×3): 3 mL via INTRAVENOUS

## 2015-09-28 MED ORDER — FAMOTIDINE IN NACL 20-0.9 MG/50ML-% IV SOLN
20.0000 mg | Freq: Once | INTRAVENOUS | Status: AC
  Administered 2015-09-28: 20 mg via INTRAVENOUS

## 2015-09-28 MED ORDER — GABAPENTIN 400 MG PO CAPS
1200.0000 mg | ORAL_CAPSULE | Freq: Every day | ORAL | Status: DC
  Administered 2015-09-28 – 2015-09-29 (×2): 1200 mg via ORAL
  Filled 2015-09-28: qty 3
  Filled 2015-09-28: qty 4

## 2015-09-28 MED ORDER — FAMOTIDINE IN NACL 20-0.9 MG/50ML-% IV SOLN
INTRAVENOUS | Status: AC
  Administered 2015-09-28: 20 mg via INTRAVENOUS
  Filled 2015-09-28: qty 50

## 2015-09-28 MED ORDER — GABAPENTIN 400 MG PO CAPS
400.0000 mg | ORAL_CAPSULE | Freq: Two times a day (BID) | ORAL | Status: DC
  Administered 2015-09-29 – 2015-09-30 (×3): 400 mg via ORAL
  Filled 2015-09-28 (×6): qty 1

## 2015-09-28 MED ORDER — LIDOCAINE VISCOUS 2 % MT SOLN
OROMUCOSAL | Status: AC
  Filled 2015-09-28: qty 15

## 2015-09-28 MED ORDER — OXYMETAZOLINE HCL 0.05 % NA SOLN
1.0000 | Freq: Once | NASAL | Status: AC
  Administered 2015-09-28: 1 via NASAL

## 2015-09-28 MED ORDER — DIPHENHYDRAMINE HCL 50 MG/ML IJ SOLN
25.0000 mg | Freq: Once | INTRAMUSCULAR | Status: AC
  Administered 2015-09-28: 25 mg via INTRAVENOUS

## 2015-09-28 MED ORDER — ENOXAPARIN SODIUM 40 MG/0.4ML ~~LOC~~ SOLN
40.0000 mg | Freq: Two times a day (BID) | SUBCUTANEOUS | Status: DC
Start: 2015-09-28 — End: 2015-09-30
  Administered 2015-09-28 – 2015-09-30 (×4): 40 mg via SUBCUTANEOUS
  Filled 2015-09-28 (×4): qty 0.4

## 2015-09-28 MED ORDER — FAMOTIDINE IN NACL 20-0.9 MG/50ML-% IV SOLN
20.0000 mg | Freq: Two times a day (BID) | INTRAVENOUS | Status: DC
  Administered 2015-09-28 – 2015-09-29 (×2): 20 mg via INTRAVENOUS
  Filled 2015-09-28 (×5): qty 50

## 2015-09-28 MED ORDER — ACETAMINOPHEN 650 MG RE SUPP: 650.0000 mg | Freq: Four times a day (QID) | RECTAL | Status: DC | PRN

## 2015-09-28 MED ORDER — METHYLPREDNISOLONE SODIUM SUCC 125 MG IJ SOLR
INTRAMUSCULAR | Status: AC
  Administered 2015-09-28: 125 mg via INTRAVENOUS
  Filled 2015-09-28: qty 2

## 2015-09-28 NOTE — H&P (Signed)
[place] [facility] [place] NAME: Gabrielle Sampson    MR#:  [id number]  DATE OF BIRTH:  Aug 15, 1971  DATE OF ADMISSION:  09/28/2015  PRIMARY CARE PHYSICIAN: Gearldine Shown, DO   REQUESTING/REFERRING PHYSICIAN: Dr. Lavonia Drafts  CHIEF COMPLAINT:   Chief Complaint  Patient presents with  . Angioedema    HISTORY OF PRESENT ILLNESS:  Gabrielle Sampson  is a 44 y.o. female with a known history of morbid obesity, hypertension, diabetes mellitus, depression and anxiety presents to the hospital secondary to significant tongue and facial swelling that she noticed this morning. Patient has been on lisinopril for almost 3 years now. Denies having any allergic reaction in the past. She also ate at a restaurant outside last night and had seafood including cooked so she and also shrimp. She does have iodine allergy according to her that she had eaten shrimp without any trouble before. She didn't have any itching rash this morning. When she woke up she noticed that her tongue was swollen and her speech was thick. As the day progress the swelling started to get worse and she presented to the emergency room. She didn't have any stridor. ENT was called and she was noted to have some swelling on the right side at the base of the tongue and tonsillar edema. Airway is protected for now. Patient also had dental work done last week and was put on amoxicillin which she finished today. She denies any penicillin allergies.  PAST MEDICAL HISTORY:   Past Medical History  Diagnosis Date  . Diabetes ([place])   . Osteoarthritis   . Fatigue   . HBP (high blood pressure)   . Reflux   . Anxiety   . Pancreatitis   . Morbid obesity ([place])     PAST SURGICAL HISTORY:   Past Surgical History  Procedure Laterality Date  . Cesarean section  [place]  . Gallbladder surgery  1996  . Knee surgery Right 2012  . Tonsillectomy and adenoidectomy    . Anal fissure  repair    . Abscess drainage      MRSA from abdominal wound    SOCIAL HISTORY:   Social History  Substance Use Topics  . Smoking status: Former Smoker    Types: Cigarettes    Quit date: 08/08/2013  . Smokeless tobacco: Never Used  . Alcohol Use: No    FAMILY HISTORY:   Family History  Problem Relation Age of Onset  . Hypothyroidism Mother   . Melanoma Mother   . CAD Father     DRUG ALLERGIES:   Allergies  Allergen Reactions  . Sulfa Antibiotics Hives  . Betadine [Povidone Iodine] Hives  . Codeine Phosphate     REACTION: vomiting  . Iodine   . Lisinopril Swelling  . Morphine And Related     REVIEW OF SYSTEMS:   Review of Systems  Constitutional: Negative for fever, chills, weight loss and malaise/fatigue.  HENT: Negative for ear discharge, ear pain, hearing loss, nosebleeds and tinnitus.   Eyes: Negative for blurred vision, double vision and photophobia.  Respiratory: Positive for cough and shortness of breath. Negative for hemoptysis and wheezing.   Cardiovascular: Negative for chest pain, palpitations, orthopnea and leg swelling.  Gastrointestinal: Negative for heartburn, nausea, vomiting, abdominal pain, diarrhea, constipation and melena.  Genitourinary: Negative for dysuria, urgency, frequency and hematuria.  Musculoskeletal: Negative for myalgias, back pain and neck pain.  Skin: Negative for rash.  Neurological: Negative for  dizziness, tingling, tremors, sensory change, speech change, focal weakness and headaches.  Endo/Heme/Allergies: Does not bruise/bleed easily.  Psychiatric/Behavioral: Negative for depression.    MEDICATIONS AT HOME:   Prior to Admission medications   Medication Sig Start Date End Date Taking? Authorizing Provider  ALPRAZolam Duanne Moron) 0.5 MG tablet Take 0.5 mg by mouth 2 (two) times daily as needed for anxiety or sleep.    Yes Historical Provider, MD  amoxicillin (AMOXIL) 250 MG capsule Take 250 mg by mouth 4 (four) times daily. For  7 days, then stop 09/25/15  Yes Historical Provider, MD  citalopram (CELEXA) 40 MG tablet Take 40 mg by mouth daily.   Yes Historical Provider, MD  cyanocobalamin (,VITAMIN B-12,) 1000 MCG/ML injection Inject 1,000 mcg into the muscle every 30 (thirty) days. On the 1st Monday of each month   Yes Historical Provider, MD  docusate sodium (COLACE) 100 MG capsule Take 100 mg by mouth 2 (two) times daily as needed for mild constipation or moderate constipation.    Yes Historical Provider, MD  gabapentin (NEURONTIN) 400 MG capsule Take 400 mg by mouth See admin instructions. 2 capsules every morning, 1 capsule every lunch, and 2 capsules at bedtime   Yes Historical Provider, MD  glipiZIDE (GLUCOTROL) 5 MG tablet Take 5 mg by mouth every morning.   Yes Historical Provider, MD  Hydrocodone-Acetaminophen 5-300 MG TABS Take 1 tablet by mouth as needed (for pain). Every 4 to 6 hours   Yes Historical Provider, MD  LANTUS SOLOSTAR 100 UNIT/ML Solostar Pen Inject 5 Units into the skin at bedtime.   Yes Historical Provider, MD  lisinopril (PRINIVIL,ZESTRIL) 10 MG tablet Take 10 mg by mouth daily.   Yes Historical Provider, MD  metoprolol (LOPRESSOR) 50 MG tablet Take 50 mg by mouth 2 (two) times daily.   Yes Historical Provider, MD  norgestimate-ethinyl estradiol (ORTHO-CYCLEN,SPRINTEC,PREVIFEM) 0.25-35 MG-MCG tablet Take 1 tablet by mouth daily.   Yes Historical Provider, MD  nystatin (MYCOSTATIN/NYSTOP) 100000 UNIT/GM POWD Apply 100,000 Units topically 2 (two) times daily.   Yes Historical Provider, MD  omeprazole (PRILOSEC) 40 MG capsule Take 40 mg by mouth daily.   Yes Historical Provider, MD      VITAL SIGNS:  Blood pressure 135/88, pulse 79, temperature 98 F (36.7 C), temperature source Oral, resp. rate 20, height 5\' 4"  (1.626 m), weight 152.2 kg (335 lb 8.6 oz), SpO2 96 %.  PHYSICAL EXAMINATION:   Physical Exam  GENERAL:  44 y.o.-year-old morbidly obese patient lying in the bed with no acute  distress.  EYES: Pupils equal, round, reactive to light and accommodation. No scleral icterus. Extraocular muscles intact.  HEENT: Head atraumatic, normocephalic. Oropharynx and nasopharynx clear. No lip swelling noted. Tongue is large. Submucosal folds under the tongue are swollen too. NECK:  Supple, no jugular venous distention. No thyroid enlargement, no tenderness.  LUNGS: Normal breath sounds bilaterally, no wheezing, rales,rhonchi or crepitation. No use of accessory muscles of respiration.  CARDIOVASCULAR: S1, S2 normal. No murmurs, rubs, or gallops.  ABDOMEN: Soft, nontender, nondistended. Bowel sounds present. No organomegaly or mass.  EXTREMITIES: No pedal edema, cyanosis, or clubbing.  NEUROLOGIC: Cranial nerves II through XII are intact. Muscle strength 5/5 in all extremities. Sensation intact. Gait not checked.  PSYCHIATRIC: The patient is alert and oriented x 3.  SKIN: No obvious rash, lesion, or ulcer.   LABORATORY PANEL:   CBC  Recent Labs Lab 09/28/15 0937  WBC 8.4  HGB 11.3*  HCT 34.3*  PLT 255   ------------------------------------------------------------------------------------------------------------------  Chemistries   Recent Labs Lab 09/28/15 0937  NA 136  K 3.9  CL 101  CO2 26  GLUCOSE 119*  BUN 9  CREATININE 0.44  CALCIUM 8.9  AST 32  ALT 12*  ALKPHOS 92  BILITOT 0.7   ------------------------------------------------------------------------------------------------------------------  Cardiac Enzymes No results for input(s): TROPONINI in the last 168 hours. ------------------------------------------------------------------------------------------------------------------  RADIOLOGY:  No results found.  EKG:   Orders placed or performed during the hospital encounter of 06/14/15  . ED EKG  . ED EKG  . EKG 12-Lead  . EKG 12-Lead  . EKG    IMPRESSION AND PLAN:   Kahealani Vondrak  is a 44 y.o. female with a known history of morbid  obesity, hypertension, diabetes mellitus, depression and anxiety presents to the hospital secondary to significant tongue and facial swelling that she noticed this morning.  #1 Angioedema-could be from the lisinopril or to seafood/shrimp that she ate last night. -Hold lisinopril. Appreciate ENT consult. Some swelling at the base of the tongue on the right noted. Follow-up laryngoscopy tomorrow. -IV steroids and IV Pepcid. Benadryl when necessary. -Monitor in ICU for airway.   #2 DM-on lantus, glipizide  - add SSI  #3 HTN- hold lisinopril. Continue metoprolol.   #4 Morbid obesity- following with a bariatric surgeon at [facility]. Plan to schedule for gastric bypass soon.   #5 Depression, anxiety-cont celexa and ativan prn  #6 DVT Prophylaxis- Lovenox  All the records are reviewed and case discussed with ED provider. Management plans discussed with the patient, family and they are in agreement.  CODE STATUS: Full Code  TOTAL TIME TAKING CARE OF THIS PATIENT: 50 minutes.    Gladstone Lighter M.D on 09/28/2015 at 6:09 PM  Between 7am to 6pm - Pager - [phone]  After 6pm go to [email or web address] - password [email or web address] [facility]  Office  [phone]  CC: Primary care physician; Arrie Aran PATRICIA, DO

## 2015-09-28 NOTE — Consult Note (Signed)
..   Monai, Sugawara [id number] May 01, 1971 Lavonia Drafts, MD  Reason for Consult: Concern for airway compromise  HPI: 44 y.o. Female with history of 1 hour of right sided tongue swelling.  She reports took her lisinopril this a.m. And then developed right sided tongue swelling an difficulty swallowing.  Denies any breathing issues.  Found to have signs of angioedema in ED and I was called emergently for laryngoscopy and evaluation of airway.  Patient reports pain with swallowing and difficulty with secretions, but no current breathing difficulty.  Allergies:  Allergies  Allergen Reactions  . Sulfa Antibiotics Hives  . Betadine [Povidone Iodine] Hives  . Codeine Phosphate     REACTION: vomiting  . Iodine   . Lisinopril Swelling  . Morphine And Related     ROS: Review of systems normal other than 12 systems except per HPI.  PMH:  Past Medical History  Diagnosis Date  . Diabetes ([place])   . Osteoarthritis   . Fatigue   . HBP (high blood pressure)   . Reflux   . Anxiety   . Pancreatitis     FH: No family history on file.  SH:  Social History   Social History  . Marital Status: Divorced    Spouse Name: N/A  . Number of Children: N/A  . Years of Education: N/A   Occupational History  . Not on file.   Social History Main Topics  . Smoking status: Former Smoker    Types: Cigarettes    Quit date: 08/08/2013  . Smokeless tobacco: Never Used  . Alcohol Use: No  . Drug Use: No  . Sexual Activity: Not on file   Other Topics Concern  . Not on file   Social History Narrative    PSH:  Past Surgical History  Procedure Laterality Date  . Cesarean section  [place]  . Gallbladder surgery  1996  . Knee surgery Right 2012  . Tonsillectomy and adenoidectomy      Physical  Exam:  GEN-  NAD, supine in bed with head elevated, no increased work of breathing, "thick" dysarthric voice NEURO- CN 2-12 grossly intact and symmetric. EARS- external ears clear OC/OP-   Right sided edema of oral tongue, floor of mouth WNL, left tongue with no to minimal swelling.  Good movement of tongue.  Mildly firm to palpation but no induration. NECK- Neck supple with no masses or lesions. No lymphadenopathy palpated. Thyroid normal with no masses.  Laryngoscopy revealed some right base of tongue edema, but widely patent airway  A/P: Angioedema due to Lisinopril  Plan:  1)  Airway patent at this point with no involvement below just the right hemi-glottis.  Will reevaluate as patient just has gotten the solumedrol and benadryl.  Discussed with patient that if worsens, may required intubation as would be at risk of airway compromise.  No signs of respiratory distress at this point, so hopefully will be able to avoid intubation.  Will recheck with laryngoscopy at 10:30 a.m.   Caliegh Middlekauff 09/28/2015 10:16 AM

## 2015-09-28 NOTE — ED Notes (Signed)
MD Sharol Roussel at bedside

## 2015-09-28 NOTE — Progress Notes (Signed)
Patient complaining of increased swelling in throat, reports swelling in mouth reduced. Patient reports no airway difficulties or shortness of breath, vital signs stable on the monitor, but does report difficulty swallowing secretions. E Link RN notified, to pass on to Margaree Mackintosh MD.

## 2015-09-28 NOTE — Progress Notes (Signed)
eLink Physician-Brief Progress Note Patient Name: Gabrielle Sampson DOB: Dec 10, 1970 MRN: [id number]   Date of Service  09/28/2015  HPI/Events of Note  New patient eval Angioedema Stable on camera check  eICU Interventions  No eICU intervention     Intervention Category Evaluation Type: New Patient Evaluation  Simonne Maffucci 09/28/2015, 4:12 PM

## 2015-09-28 NOTE — Progress Notes (Signed)
ANTICOAGULATION CONSULT NOTE - Initial Consult  Pharmacy Consult for Lovenox Indication: VTE prophylaxis  Allergies  Allergen Reactions  . Sulfa Antibiotics Hives  . Betadine [Povidone Iodine] Hives  . Codeine Phosphate     REACTION: vomiting  . Iodine   . Lisinopril Swelling  . Morphine And Related     Patient Measurements: Height: 5\' 4"  (162.6 cm) Weight: (!) 335 lb 8.6 oz (152.2 kg) IBW/kg (Calculated) : 54.7 Heparin Dosing Weight:   Vital Signs: Temp: 98 F (36.7 C) (11/11 1519) Temp Source: Oral (11/11 1519) BP: 147/87 mmHg (11/11 1519) Pulse Rate: 78 (11/11 1519)  Labs:  Recent Labs  09/28/15 0937  HGB 11.3*  HCT 34.3*  PLT 255  CREATININE 0.44    Estimated Creatinine Clearance: 132.7 mL/min (by C-G formula based on Cr of 0.44).   Medical History: Past Medical History  Diagnosis Date  . Diabetes ([place])   . Osteoarthritis   . Fatigue   . HBP (high blood pressure)   . Reflux   . Anxiety   . Pancreatitis     Medications:  Prescriptions prior to admission  Medication Sig Dispense Refill Last Dose  . ALPRAZolam (XANAX) 0.5 MG tablet Take 0.5 mg by mouth 2 (two) times daily as needed for anxiety or sleep.    PRN  . amoxicillin (AMOXIL) 250 MG capsule Take 250 mg by mouth 4 (four) times daily. For 7 days, then stop   09/28/2015 at am  . citalopram (CELEXA) 40 MG tablet Take 40 mg by mouth daily.   09/28/2015 at am  . cyanocobalamin (,VITAMIN B-12,) 1000 MCG/ML injection Inject 1,000 mcg into the muscle every 30 (thirty) days. On the 1st Monday of each month   09/18/2015  . docusate sodium (COLACE) 100 MG capsule Take 100 mg by mouth 2 (two) times daily as needed for mild constipation or moderate constipation.    PRN  . gabapentin (NEURONTIN) 400 MG capsule Take 400 mg by mouth See admin instructions. 2 capsules every morning, 1 capsule every lunch, and 2 capsules at bedtime   09/28/2015 at am  . glipiZIDE (GLUCOTROL) 5 MG tablet Take 5 mg by mouth every  morning.   09/28/2015 at am  . Hydrocodone-Acetaminophen 5-300 MG TABS Take 1 tablet by mouth as needed (for pain). Every 4 to 6 hours   PRN  . LANTUS SOLOSTAR 100 UNIT/ML Solostar Pen Inject 5 Units into the skin at bedtime.   09/27/2015 at pm  . lisinopril (PRINIVIL,ZESTRIL) 10 MG tablet Take 10 mg by mouth daily.   09/28/2015 at am  . metoprolol (LOPRESSOR) 50 MG tablet Take 50 mg by mouth 2 (two) times daily.   09/28/2015 at 0830  . norgestimate-ethinyl estradiol (ORTHO-CYCLEN,SPRINTEC,PREVIFEM) 0.25-35 MG-MCG tablet Take 1 tablet by mouth daily.   09/28/2015 at am  . nystatin (MYCOSTATIN/NYSTOP) 100000 UNIT/GM POWD Apply 100,000 Units topically 2 (two) times daily.   09/28/2015 at am  . omeprazole (PRILOSEC) 40 MG capsule Take 40 mg by mouth daily.   09/28/2015 at am    Assessment: CrCl = 132.7 ml/min BMI = 57.7  Goal of Therapy:  DVT prophylaxis   Plan:  Lovenox 40 mg SQ Q24H originally ordered.  Will adjust dose to lovenox 40 mg SQ Q12H based on BMI > 40.   Dawsen Krieger D 09/28/2015,3:57 PM

## 2015-09-28 NOTE — ED Provider Notes (Signed)
[facility] Emergency Department Provider Note  ____________________________________________  Time seen: On arrival  I have reviewed the triage vital signs and the nursing notes.   HISTORY  Chief Complaint Angioedema    HPI Gabrielle Sampson is a 44 y.o. female who presents with tongue and throat swelling. Patient reports this started approximate 30 minutes to one hour prior to arrival. She first noticed a strange sensation in her tongue which then spread to her throat. She took some Benadryl with no relief and then came to the emergency department. This is never happened before. She does take lisinopril for blood pressure and has for some time. She denies shortness of breath. She reports she finds it very difficult to swallow. She does not smoke, she has not eaten today     Past Medical History  Diagnosis Date  . Diabetes ([place])   . Osteoarthritis   . Fatigue   . HBP (high blood pressure)   . Reflux   . Anxiety   . Pancreatitis     Patient Active Problem List   Diagnosis Date Noted  . ABDOMINAL PAIN, EPIGASTRIC 06/18/2007  . PANCREATITIS 05/10/2007  . URTICARIA 05/10/2007  . ANXIETY 04/29/2007  . DEPRESSION 04/29/2007  . HYPERTENSION 04/29/2007  . DERMATOFIBROMA 02/22/2007  . IRRITABLE BOWEL SYNDROME 02/22/2007  . SKIN TAG 02/22/2007  . SLEEP DISORDER 02/22/2007    Past Surgical History  Procedure Laterality Date  . Cesarean section  [place]  . Gallbladder surgery  1996  . Knee surgery Right 2012  . Tonsillectomy and adenoidectomy      Current Outpatient Rx  Name  Route  Sig  Dispense  Refill  . EXPIRED: albuterol (PROAIR HFA) 108 (90 BASE) MCG/ACT inhaler   Inhalation   Inhale 2 puffs into the lungs every 6 (six) hours as needed.         . citalopram (CELEXA) 40 MG tablet   Oral   Take 20 mg by mouth daily.         Marland Kitchen gabapentin (NEURONTIN) 400 MG capsule   Oral   Take 1 capsule by mouth 5 (five) times daily.       3   . glucose blood test strip   Other   1 each by Other route as needed for other. Use as instructed         . meloxicam (MOBIC) 15 MG tablet   Oral   Take 1 tablet by mouth daily.         . predniSONE (DELTASONE) 10 MG tablet   Oral   Take 1 tablet by mouth taper from 4 doses each day to 1 dose and stop.           Allergies Sulfa antibiotics; Betadine; Codeine phosphate; Iodine; Lisinopril; and Morphine and related  No family history on file.  Social History Social History  Substance Use Topics  . Smoking status: Former Smoker    Types: Cigarettes    Quit date: 08/08/2013  . Smokeless tobacco: Never Used  . Alcohol Use: No    Review of Systems  Constitutional: Negative for fever. Eyes: Negative for visual changes. ENT: Multi-swallowing Cardiovascular: Negative for chest pain. Respiratory: Negative for shortness of breath. Gastrointestinal: Negative for abdominal pain, vomiting and diarrhea. Genitourinary: Negative for dysuria. Musculoskeletal: Negative for back pain. Skin: Negative for rash. Neurological: Negative for headaches or focal weakness Psychiatric: Mild anxiety    ____________________________________________   PHYSICAL EXAM:  VITAL SIGNS: ED Triage Vitals  Enc Vitals Group     BP 09/28/15 0922 192/100 mmHg     Pulse Rate 09/28/15 0922 85     Resp 09/28/15 0922 20     Temp 09/28/15 0924 98.7 F (37.1 C)     Temp Source 09/28/15 0924 Oral     SpO2 09/28/15 0922 98 %     Weight --      Height --      Head Cir --      Peak Flow --      Pain Score --      Pain Loc --      Pain Edu? --      Excl. in [place]? --      Constitutional: Alert and oriented. Well appearing and anxious Eyes: Conjunctivae are normal.  ENT   Head: Normocephalic and atraumatic.   Mouth/Throat: Mucous membranes are moist. Right lateral tongue swollen to approximately 2-3 times the size of the left tongue. Lips are normal. Difficult to visualize  posterior pharynx but no uvular edema. No stridor Cardiovascular: Normal rate, regular rhythm. Normal and symmetric distal pulses are present in all extremities. No murmurs, rubs, or gallops. Respiratory: Normal respiratory effort without tachypnea nor retractions. Breath sounds are clear and equal bilaterally.  Gastrointestinal: Soft and non-tender in all quadrants. No distention. There is no CVA tenderness. Genitourinary: deferred Musculoskeletal: Nontender with normal range of motion in all extremities. No lower extremity tenderness nor edema. Neurologic:  Normal speech and language. No gross focal neurologic deficits are appreciated. Skin:  Skin is warm, dry and intact. No rash noted. Psychiatric: Mood and affect are normal. Patient exhibits appropriate insight and judgment.  ____________________________________________    LABS (pertinent positives/negatives)  Labs Reviewed  CBC  COMPREHENSIVE METABOLIC PANEL    ____________________________________________   EKG  None  ____________________________________________    RADIOLOGY I have personally reviewed any xrays that were ordered on this patient: None  ____________________________________________   PROCEDURES  Procedure(s) performed: none  Critical Care performed:yes  CRITICAL CARE Performed by: Lavonia Drafts   Total critical care time: 30 minutes  Critical care time was exclusive of separately billable procedures and treating other patients.  Critical care was necessary to treat or prevent imminent or life-threatening deterioration.  Critical care was time spent personally by me on the following activities: development of treatment plan with patient and/or surrogate as well as nursing, discussions with consultants, evaluation of patient's response to treatment, examination of patient, obtaining history from patient or surrogate, ordering and performing treatments and interventions, ordering and review of  laboratory studies, ordering and review of radiographic studies, pulse oximetry and re-evaluation of patient's condition.   ____________________________________________   INITIAL IMPRESSION / ASSESSMENT AND PLAN / ED COURSE  Pertinent labs & imaging results that were available during my care of the patient were reviewed by me and considered in my medical decision making (see chart for details).  After exam ENT immediately consulted given difficulty swallowing. Solu-Medrol, Benadryl and Pepcid given IV.  Discussed with patient the possibility that intubation will be required and she is aware of this.  ----------------------------------------- 9:45 AM on 09/28/2015 -----------------------------------------  At this time no evidence of worsening swelling or distress, ENT is en route.   ----------------------------------------- 9:57 AM on 09/28/2015 -----------------------------------------  ENT has scoped the patient and notes no airway involvement at this time. Recommend supportive care for now, he will re-scope in half an hour ____________________________________________   ----------------------------------------- 10:43 AM on 09/28/2015 -----------------------------------------  Rescoped by ENT, no  progression. He will scope her again in a couple of hours in the meantime we will observe her in the emergency department  ----------------------------------------- 12:28 PM on 09/28/2015 -----------------------------------------  ENT Dr. Pryor Ochoa has again scope the patient, no worsening, oral symptoms are improving. She reports she is feeling better . He recommends admission by medicine for airway watch.  I informed the patient not take lisinopril anymore  FINAL CLINICAL IMPRESSION(S) / ED DIAGNOSES  Final diagnoses:  Angioedema, initial encounter     Lavonia Drafts, MD 09/28/15 1300

## 2015-09-28 NOTE — ED Notes (Signed)
Pt here with difficulty speaking and gross swelling to right side of tongue. Pt has been on lisinopril x3-4 years. Pt reports swelling started 1 hour PTA.

## 2015-09-28 NOTE — Progress Notes (Signed)
..   09/28/2015 1:14 PM  [name] [id number]  Hospital Day # 1    Temp:  [98.7 F (37.1 C)] 98.7 F (37.1 C) (11/11 0924) Pulse Rate:  [67-85] 76 (11/11 1300) Resp:  [13-23] 23 (11/11 1300) BP: (131-192)/(72-100) 148/83 mmHg (11/11 1300) SpO2:  [92 %-100 %] 99 % (11/11 1300),    No intake or output data in the 24 hours ending 09/28/15 1314  Results for orders placed or performed during the hospital encounter of 09/28/15 (from the past 24 hour(s))  CBC     Status: Abnormal   Collection Time: 09/28/15  9:37 AM  Result Value Ref Range   WBC 8.4 3.6 - 11.0 K/uL   RBC 4.06 3.80 - 5.20 MIL/uL   Hemoglobin 11.3 (L) 12.0 - 16.0 g/dL   HCT 34.3 (L) 35.0 - 47.0 %   MCV 84.4 80.0 - 100.0 fL   MCH 27.9 26.0 - 34.0 pg   MCHC 33.0 32.0 - 36.0 g/dL   RDW 13.8 11.5 - 14.5 %   Platelets 255 150 - 440 K/uL  Comprehensive metabolic panel     Status: Abnormal   Collection Time: 09/28/15  9:37 AM  Result Value Ref Range   Sodium 136 135 - 145 mmol/L   Potassium 3.9 3.5 - 5.1 mmol/L   Chloride 101 101 - 111 mmol/L   CO2 26 22 - 32 mmol/L   Glucose, Bld 119 (H) 65 - 99 mg/dL   BUN 9 6 - 20 mg/dL   Creatinine, Ser 0.44 0.44 - 1.00 mg/dL   Calcium 8.9 8.9 - 10.3 mg/dL   Total Protein 7.3 6.5 - 8.1 g/dL   Albumin 3.1 (L) 3.5 - 5.0 g/dL   AST 32 15 - 41 U/L   ALT 12 (L) 14 - 54 U/L   Alkaline Phosphatase 92 38 - 126 U/L   Total Bilirubin 0.7 0.3 - 1.2 mg/dL   GFR calc non Af Amer >60 >60 mL/min   GFR calc Af Amer >60 >60 mL/min   Anion gap 9 5 - 15    SUBJECTIVE:  Improved pain from previous.  Able to talk more.  No breathing issues.  Tolerating secretions better.  OBJECTIVE:  GEN- NAD, sitting in bed talking OC/OP- improved oral tongue edema on right, continued edema on ventral aspect of right oral tongue/floor of mouth, continues to be soft to palpation  Laryngoscopy-  Slightly improved base of tongue/lingual tonsil edema on right.  Continued normal epiglottis, supraglottis,  glottis, hypopharynx.  Impression:  Angioedema slightly improved  PLAN:  Discussed with Dr. Tami Ribas who is the ENT on call.  Recommend continued observation for airway monitoring overnight for a full 24 hours after taking dose of lisinopril.  Recommend continued steroids for 24 hours and then taper.  Strict avoidance of ACE-Inhibitors.  Gabrielle Sampson 09/28/2015, 1:14 PM

## 2015-09-28 NOTE — Progress Notes (Signed)
eLink Physician-Brief Progress Note Patient Name: Gabrielle Sampson DOB: 04-25-1971 MRN: [id number]   Date of Service  09/28/2015  HPI/Events of Note  Reports anxiety No trouble breathing No hoarseness Doesn't want to swallow pills  eICU Interventions  Change xanax to prn ativan IV Monitor respiratory status closely     Intervention Category Minor Interventions: Agitation / anxiety - evaluation and management  Simonne Maffucci 09/28/2015, 6:03 PM

## 2015-09-28 NOTE — Progress Notes (Signed)
eLink Physician-Brief Progress Note Patient Name: Gabrielle Sampson DOB: [birth year] MRN: [id number]   Date of Service  09/28/2015  HPI/Events of Note  Anxiety - Already on Ativan 0.5 to 1 mg IV Q 4 hours.   eICU Interventions  Will increase Ativan frequency to Q 3 hours.      Intervention Category Minor Interventions: Agitation / anxiety - evaluation and management  Dnaiel Voller Eugene 09/28/2015, 9:35 PM

## 2015-09-28 NOTE — ED Notes (Addendum)
ENT MD, Sharol Roussel at bedside

## 2015-09-28 NOTE — Op Note (Signed)
....  09/28/2015  10:10 AM    [name]  [id number]   Pre-Op Dx:  Angioedema  Post-op Dx: Angioedema  Proc: Flexible laryngoscopy   Surg:  [facility]  Anes:  GOT  EBL:   0  Comp:  none  Findings:  Right sided oral tongue edema extending to inferior tonsil/right base of tongue consistent with boggy angioedema.  Epiglottis uninvolved.  Left base of tongue uninvolved.  Larynx and hypopharynx within normal limits.  Procedure: After the patient was emergently identified in the E.D., verbal consent was obtained for flexible laryngoscopy.  Due to concerns of airway compromise, no numbing medicine was used and only AFRIN was used in the patient's right nasal cavity.  Flexible laryngoscopy was performed through the patient's right nasal cavity with findings detailed above.  The patient's nasopharynx, oropharynx, larynx was visualized and evaluated and vocal folds examined for function as well as lesions or masses.  After this was completed, the scope was carefully removed from the patient's nasal cavity.  The patient tolerated the procedure well.  Dispo:   Continued monitoring in ED for respiratory status  Plan:  Recheck in 30 minutes for signs of improvement or worsening or airway edema.  Ahsan Esterline  09/28/2015 10:10 AM

## 2015-09-28 NOTE — ED Notes (Signed)
Pt c/o having trouble breathing, reassured patient that her oxygen saturation and pulse are good and remain unchanged. Pt also reports having some anxiety as well.

## 2015-09-29 LAB — CBC
HEMATOCRIT: 32.8 % — AB (ref 35.0–47.0)
Hemoglobin: 10.9 g/dL — ABNORMAL LOW (ref 12.0–16.0)
MCH: 28.1 pg (ref 26.0–34.0)
MCHC: 33.2 g/dL (ref 32.0–36.0)
MCV: 84.7 fL (ref 80.0–100.0)
PLATELETS: 227 10*3/uL (ref 150–440)
RBC: 3.88 MIL/uL (ref 3.80–5.20)
RDW: 14 % (ref 11.5–14.5)
WBC: 8.6 10*3/uL (ref 3.6–11.0)

## 2015-09-29 LAB — GLUCOSE, CAPILLARY
GLUCOSE-CAPILLARY: 216 mg/dL — AB (ref 65–99)
GLUCOSE-CAPILLARY: 92 mg/dL (ref 65–99)
Glucose-Capillary: 156 mg/dL — ABNORMAL HIGH (ref 65–99)
Glucose-Capillary: 163 mg/dL — ABNORMAL HIGH (ref 65–99)

## 2015-09-29 LAB — BASIC METABOLIC PANEL
Anion gap: 8 (ref 5–15)
BUN: 8 mg/dL (ref 6–20)
CO2: 28 mmol/L (ref 22–32)
CREATININE: 0.47 mg/dL (ref 0.44–1.00)
Calcium: 9.2 mg/dL (ref 8.9–10.3)
Chloride: 102 mmol/L (ref 101–111)
GFR calc Af Amer: >60 mL/min (ref >60)
GLUCOSE: 167 mg/dL — AB (ref 65–99)
POTASSIUM: 4.4 mmol/L (ref 3.5–5.1)
Sodium: 138 mmol/L (ref 135–145)

## 2015-09-29 MED ORDER — CEPASTAT 14.5 MG MT LOZG
1.0000 | LOZENGE | OROMUCOSAL | Status: DC | PRN
  Filled 2015-09-29: qty 9

## 2015-09-29 MED ORDER — FLUCONAZOLE 150 MG PO TABS
150.0000 mg | ORAL_TABLET | Freq: Once | ORAL | Status: AC
  Administered 2015-09-29: 150 mg via ORAL
  Filled 2015-09-29: qty 1

## 2015-09-29 MED ORDER — ALPRAZOLAM 0.5 MG PO TABS
0.5000 mg | ORAL_TABLET | Freq: Two times a day (BID) | ORAL | Status: DC | PRN
  Administered 2015-09-29: 0.5 mg via ORAL
  Filled 2015-09-29: qty 1

## 2015-09-29 NOTE — Progress Notes (Signed)
Spoke with Dr. Ether Griffins regarding patient's complaint of yeast infection.  Orders received and entered.

## 2015-09-29 NOTE — Progress Notes (Signed)
[name] [facility] [place] NAME: Gabrielle Sampson    MR#:  [id number]  DATE OF BIRTH:  44-Oct-1972  SUBJECTIVE:  CHIEF COMPLAINT:   Chief Complaint  Patient presents with  . Angioedema   patient is 44 year old Caucasian female with past medical history significant for history of obesity, hypertension, diabetes, depression, anxiety who presents to the hospital with tongue and neck swelling. Because of concern of angioedema. Her lisinopril was stopped and she was initiated on steroids, around-the-clock. She feels a little bit better today, still admits of having some scratchy feeling in the throat, intermittent difficulty swallowing, although requested by diet to be upgraded to a soft diet. Admits of some panic attacks with difficulty swallowing requests Xanax.   Review of Systems  Constitutional: Negative for fever, chills and weight loss.  HENT: Positive for sore throat. Negative for congestion.   Eyes: Negative for blurred vision and double vision.  Respiratory: Positive for stridor. Negative for cough, sputum production, shortness of breath and wheezing.   Cardiovascular: Negative for chest pain, palpitations, orthopnea, leg swelling and PND.  Gastrointestinal: Negative for nausea, vomiting, abdominal pain, diarrhea, constipation and blood in stool.  Genitourinary: Negative for dysuria, urgency, frequency and hematuria.  Musculoskeletal: Negative for falls.  Neurological: Negative for dizziness, tremors, focal weakness and headaches.  Endo/Heme/Allergies: Does not bruise/bleed easily.  Psychiatric/Behavioral: Negative for depression. The patient does not have insomnia.    admits of swallowing difficulties and anxiety/panic attack associated with it  VITAL SIGNS: Blood pressure 138/90, pulse 75, temperature 97.9 F (36.6 C), temperature source Axillary, resp. rate 19, height 5\' 4"  (1.626 m), weight 152.2 kg (335 lb 8.6 oz), SpO2 97  %.  PHYSICAL EXAMINATION:   GENERAL:  44 y.o.-year-old morbidly obese patient lying in the bed with no acute distress.  EYES: Pupils equal, round, reactive to light and accommodation. No scleral icterus. Extraocular muscles intact. Significant submandibular area, neck swelling, but no tongue swelling was noted HEENT: Head atraumatic, normocephalic. Oropharynx and nasopharynx clear. No erythema NECK:  Supple, no jugular venous distention. No thyroid enlargement, no tenderness.  LUNGS: Normal breath sounds bilaterally, no wheezing, rales,rhonchi or crepitation. No use of accessory muscles of respiration.  CARDIOVASCULAR: S1, S2 normal. No murmurs, rubs, or gallops.  ABDOMEN: Soft, nontender, nondistended. Bowel sounds present. No organomegaly or mass.  EXTREMITIES: No pedal edema, cyanosis, or clubbing.  NEUROLOGIC: Cranial nerves II through XII are intact. Muscle strength 5/5 in all extremities. Sensation intact. Gait not checked.  PSYCHIATRIC: The patient is alert and oriented x 3.  SKIN: No obvious rash, lesion, or ulcer.   ORDERS/RESULTS REVIEWED:   CBC  Recent Labs Lab 09/28/15 0937 09/29/15 0654  WBC 8.4 8.6  HGB 11.3* 10.9*  HCT 34.3* 32.8*  PLT 255 227  MCV 84.4 84.7  MCH 27.9 28.1  MCHC 33.0 33.2  RDW 13.8 14.0   ------------------------------------------------------------------------------------------------------------------  Chemistries   Recent Labs Lab 09/28/15 0937 09/29/15 0654  NA 136 138  K 3.9 4.4  CL 101 102  CO2 26 28  GLUCOSE 119* 167*  BUN 9 8  CREATININE 0.44 0.47  CALCIUM 8.9 9.2  AST 32  --   ALT 12*  --   ALKPHOS 92  --   BILITOT 0.7  --    ------------------------------------------------------------------------------------------------------------------ estimated creatinine clearance is 132.7 mL/min (by C-G formula based on Cr of  0.47). ------------------------------------------------------------------------------------------------------------------ No results for input(s): TSH, T4TOTAL, T3FREE, THYROIDAB in the last 72 hours.  Invalid  input(s): FREET3  Cardiac Enzymes No results for input(s): CKMB, TROPONINI, MYOGLOBIN in the last 168 hours.  Invalid input(s): CK ------------------------------------------------------------------------------------------------------------------ Invalid input(s): POCBNP ---------------------------------------------------------------------------------------------------------------  RADIOLOGY: No results found.  EKG:  Orders placed or performed during the hospital encounter of 06/14/15  . ED EKG  . ED EKG  . EKG 12-Lead  . EKG 12-Lead  . EKG    ASSESSMENT AND PLAN:  Active Problems:   Angioedema 1. Angioedema likely due to lisinopril, which is already stopped. Continue patient on steroids, Benadryl, Pepcid, follow clinically. Some improvement, although edema remains around the neck area 2. Dysphagia, supportive therapy for liquid diet, advanced to soft as tolerated 3. Anxiety, panic attacks. Continue patient on Xanax 4. Essential hypertension. Symptomatically, relatively well controlled 5. Diabetes mellitus. Continue patient on sliding scale insulin for now since her oral intake is unpredictable   Management plans discussed with the patient, family and they are in agreement.   DRUG ALLERGIES:  Allergies  Allergen Reactions  . Sulfa Antibiotics Hives  . Betadine [Povidone Iodine] Hives  . Codeine Phosphate     REACTION: vomiting  . Iodine   . Lisinopril Swelling  . Morphine And Related     CODE STATUS:     Code Status Orders        Start     Ordered   09/28/15 1542  Full code   Continuous     09/28/15 1541      TOTAL TIME TAKING CARE OF THIS PATIENT: 40 minutes.    Theodoro Grist M.D on 09/29/2015 at 2:59 PM  Between 7am to 6pm - Pager -  [phone]  After 6pm go to [email or web address] - password [email or web address] [facility]  Office  [phone]  CC: Primary care physician; Arrie Aran PATRICIA, DO

## 2015-09-29 NOTE — Progress Notes (Signed)
Patient moved to room 218 on 2C by wheelchair with Baker Janus, NT and daughter at side. Off unit tele monitor applied prior to moving and verified with Evon, tele clerk.

## 2015-09-29 NOTE — Progress Notes (Signed)
RN spoke with Dr. Ether Griffins and MD gave order for patient to transfer to any med surg.

## 2015-09-29 NOTE — Progress Notes (Signed)
Report called to [facility] on [place]. Patient has been A&Ox4 during shift. Denies pain. Tolerating full liquid diet well. No respiratory distress noted during shift. NSR per cardiac monitor. Family at bedside.

## 2015-09-29 NOTE — Progress Notes (Signed)
RN spoke with Dr. Ether Griffins and made her aware that patient does not like cream of wheat or yogurt and wants grits.  MD gave order for a soft diet.

## 2015-09-30 DIAGNOSIS — E118 Type 2 diabetes mellitus with unspecified complications: Secondary | ICD-10-CM

## 2015-09-30 DIAGNOSIS — E119 Type 2 diabetes mellitus without complications: Secondary | ICD-10-CM

## 2015-09-30 DIAGNOSIS — I10 Essential (primary) hypertension: Secondary | ICD-10-CM

## 2015-09-30 DIAGNOSIS — E669 Obesity, unspecified: Secondary | ICD-10-CM

## 2015-09-30 DIAGNOSIS — R1310 Dysphagia, unspecified: Secondary | ICD-10-CM

## 2015-09-30 LAB — GLUCOSE, CAPILLARY: GLUCOSE-CAPILLARY: 131 mg/dL — AB (ref 65–99)

## 2015-09-30 MED ORDER — FAMOTIDINE 20 MG PO TABS: 20.0000 mg | ORAL_TABLET | Freq: Two times a day (BID) | ORAL | Status: DC

## 2015-09-30 MED ORDER — CEPASTAT 14.5 MG MT LOZG: 1.0000 | LOZENGE | OROMUCOSAL | Status: DC | PRN

## 2015-09-30 MED ORDER — METHYLPREDNISOLONE 4 MG PO TBPK: ORAL_TABLET | ORAL | Status: DC

## 2015-09-30 MED ORDER — DIPHENHYDRAMINE HCL 50 MG PO CAPS: 50.0000 mg | ORAL_CAPSULE | Freq: Four times a day (QID) | ORAL | Status: DC | PRN

## 2015-09-30 NOTE — Progress Notes (Signed)
Patient alert and oriented. Concerns addressed. Tolerated diet well, swallowed well. IV site removed. Discharge summary given to patient. Discharged via nursing staff

## 2015-09-30 NOTE — Discharge Summary (Signed)
[facility] [place] NAME: Gabrielle Sampson    MR#:  [id number]  DATE OF BIRTH:  Aug 05, 1971  DATE OF ADMISSION:  09/28/2015 ADMITTING PHYSICIAN: Gladstone Lighter, MD  DATE OF DISCHARGE: 09/30/2015 11:36 AM  PRIMARY CARE PHYSICIAN: Arrie Aran PATRICIA, DO     ADMISSION DIAGNOSIS:  Angioedema, initial encounter [T78.3XXA]  DISCHARGE DIAGNOSIS:  Principal Problem:   Angioedema Active Problems:   Dysphagia   Benign essential HTN   Diabetes ([place])   Obesity   SECONDARY DIAGNOSIS:   Past Medical History  Diagnosis Date  . Diabetes ([place])   . Osteoarthritis   . Fatigue   . HBP (high blood pressure)   . Reflux   . Anxiety   . Pancreatitis   . Morbid obesity ([place])     .pro HOSPITAL COURSE:   Patient is 44 year old Caucasian female with history of essential hypertension who is on lisinopril for that presents to the hospital with neck , tongue swelling, difficulty swallowing, but had no breathing problems. She was admitted to the hospital with diagnosis of angioedema and her lisinopril was stopped. Initiated on Benadryl, Pepcid and steroids with improvement of her condition. She was seen by ENT physician in emergency room, who performed emergent laryngoscopy and evaluation of airways. She will was felt to have no involvement below the right hemi-glottis. This therapy, her condition improved and she was felt to be stable to be discharged home today Discussion by problem 1. Angioedema due to lisinopril, which was discontinued. Continue patient on steroid taper,  Benadryl, Pepcid, follow-up with outpatient PCP in the next few days. Cepacol lozenges as needed for comfort 2. Dysphagia, improved, now on advanced diet. Patient is to advance it to regular diet as tolerated 3. Anxiety, panic attacks. Continue patient on Xanax 4. Essential hypertension. Relatively well controlled 5. Diabetes mellitus. Continue outpatient  medications   DISCHARGE CONDITIONS:   Stable. Stable  CONSULTS OBTAINED:  Treatment Team:  Carloyn Manner, MD  DRUG ALLERGIES:   Allergies  Allergen Reactions  . Sulfa Antibiotics Hives  . Betadine [Povidone Iodine] Hives  . Codeine Phosphate     REACTION: vomiting  . Iodine   . Lisinopril Swelling  . Morphine And Related     DISCHARGE MEDICATIONS:   Discharge Medication List as of 09/30/2015 11:04 AM    START taking these medications   Details  diphenhydrAMINE (BENADRYL) 50 MG capsule Take 1 capsule (50 mg total) by mouth every 6 (six) hours as needed for itching or allergies., Starting 09/30/2015, Until Discontinued, Normal    famotidine (PEPCID) 20 MG tablet Take 1 tablet (20 mg total) by mouth 2 (two) times daily., Starting 09/30/2015, Until Discontinued, Normal    methylPREDNISolone (MEDROL DOSEPAK) 4 MG TBPK tablet follow package directions, Normal    phenol-menthol (CEPASTAT) 14.5 MG lozenge Place 1 lozenge inside cheek as needed for sore throat., Starting 09/30/2015, Until Discontinued, Normal      CONTINUE these medications which have NOT CHANGED   Details  ALPRAZolam (XANAX) 0.5 MG tablet Take 0.5 mg by mouth 2 (two) times daily as needed for anxiety or sleep. , Until Discontinued, Historical Med    citalopram (CELEXA) 40 MG tablet Take 40 mg by mouth daily., Until Discontinued, Historical Med    cyanocobalamin (,VITAMIN B-12,) 1000 MCG/ML injection Inject 1,000 mcg into the muscle every 30 (thirty) days. On the 1st Monday of each month, Until Discontinued, Historical Med    docusate sodium (COLACE) 100 MG capsule  Take 100 mg by mouth 2 (two) times daily as needed for mild constipation or moderate constipation. , Until Discontinued, Historical Med    gabapentin (NEURONTIN) 400 MG capsule Take 400 mg by mouth See admin instructions. 2 capsules every morning, 1 capsule every lunch, and 2 capsules at bedtime, Until Discontinued, Historical Med    glipiZIDE  (GLUCOTROL) 5 MG tablet Take 5 mg by mouth every morning., Until Discontinued, Historical Med    Hydrocodone-Acetaminophen 5-300 MG TABS Take 1 tablet by mouth as needed (for pain). Every 4 to 6 hours, Until Discontinued, Historical Med    LANTUS SOLOSTAR 100 UNIT/ML Solostar Pen Inject 5 Units into the skin at bedtime., Until Discontinued, Historical Med    metoprolol (LOPRESSOR) 50 MG tablet Take 50 mg by mouth 2 (two) times daily., Until Discontinued, Historical Med    norgestimate-ethinyl estradiol (ORTHO-CYCLEN,SPRINTEC,PREVIFEM) 0.25-35 MG-MCG tablet Take 1 tablet by mouth daily., Until Discontinued, Historical Med    nystatin (MYCOSTATIN/NYSTOP) 100000 UNIT/GM POWD Apply 100,000 Units topically 2 (two) times daily., Until Discontinued, Historical Med    omeprazole (PRILOSEC) 40 MG capsule Take 40 mg by mouth daily., Until Discontinued, Historical Med      STOP taking these medications     amoxicillin (AMOXIL) 250 MG capsule      lisinopril (PRINIVIL,ZESTRIL) 10 MG tablet          DISCHARGE INSTRUCTIONS:    Patient is to follow-up with her primary care physician at [facility] primary care in the next 1 week, ENT if needed  If you experience worsening of your admission symptoms, develop shortness of breath, life threatening emergency, suicidal or homicidal thoughts you must seek medical attention immediately by calling 911 or calling your MD immediately  if symptoms less severe.  You Must read complete instructions/literature along with all the possible adverse reactions/side effects for all the Medicines you take and that have been prescribed to you. Take any new Medicines after you have completely understood and accept all the possible adverse reactions/side effects.   Please note  You were cared for by a hospitalist during your hospital stay. If you have any questions about your discharge medications or the care you received while you were in the hospital after you are  discharged, you can call the unit and asked to speak with the hospitalist on call if the hospitalist that took care of you is not available. Once you are discharged, your primary care physician will handle any further medical issues. Please note that NO REFILLS for any discharge medications will be authorized once you are discharged, as it is imperative that you return to your primary care physician (or establish a relationship with a primary care physician if you do not have one) for your aftercare needs so that they can reassess your need for medications and monitor your lab values.    Today   CHIEF COMPLAINT:   Chief Complaint  Patient presents with  . Angioedema    HISTORY OF PRESENT ILLNESS:  Gabrielle Sampson  is a 44 y.o. female with a known history of essential hypertension who is on lisinopril for that presents to the hospital with neck , tongue swelling, difficulty swallowing, but had no breathing problems. She was admitted to the hospital with diagnosis of angioedema and her lisinopril was stopped. Initiated on Benadryl, Pepcid and steroids with improvement of her condition. She was seen by ENT physician in emergency room, who performed emergent laryngoscopy and evaluation of airways. She will was felt to have no  involvement below the right hemi-glottis. This therapy, her condition improved and she was felt to be stable to be discharged home today Discussion by problem 1. Angioedema due to lisinopril, which was discontinued. Continue patient on steroid taper,  Benadryl, Pepcid, follow-up with outpatient PCP in the next few days. Cepacol lozenges as needed for comfort 2. Dysphagia, improved, now on advanced diet. Patient is to advance it to regular diet as tolerated 3. Anxiety, panic attacks. Continue patient on Xanax 4. Essential hypertension. Relatively well controlled 5. Diabetes mellitus. Continue outpatient medications    VITAL SIGNS:  Blood pressure 134/64, pulse 65,  temperature 97.7 F (36.5 C), temperature source Oral, resp. rate 16, height 5\' 4"  (1.626 m), weight 152.2 kg (335 lb 8.6 oz), SpO2 97 %.  I/O:   Intake/Output Summary (Last 24 hours) at 09/30/15 1446 Last data filed at 09/30/15 0900  Gross per 24 hour  Intake    340 ml  Output   2150 ml  Net  -1810 ml    PHYSICAL EXAMINATION:  GENERAL:  44 y.o.-year-old patient lying in the bed with no acute distress.  EYES: Pupils equal, round, reactive to light and accommodation. No scleral icterus. Extraocular muscles intact.  HEENT: Head atraumatic, normocephalic. Oropharynx and nasopharynx clear.  NECK:  Supple, no jugular venous distention. No thyroid enlargement, no tenderness.  LUNGS: Normal breath sounds bilaterally, no wheezing, rales,rhonchi or crepitation. No use of accessory muscles of respiration.  CARDIOVASCULAR: S1, S2 normal. No murmurs, rubs, or gallops.  ABDOMEN: Soft, non-tender, non-distended. Bowel sounds present. No organomegaly or mass.  EXTREMITIES: No pedal edema, cyanosis, or clubbing.  NEUROLOGIC: Cranial nerves II through XII are intact. Muscle strength 5/5 in all extremities. Sensation intact. Gait not checked.  PSYCHIATRIC: The patient is alert and oriented x 3.  SKIN: No obvious rash, lesion, or ulcer.   DATA REVIEW:   CBC  Recent Labs Lab 09/29/15 0654  WBC 8.6  HGB 10.9*  HCT 32.8*  PLT 227    Chemistries   Recent Labs Lab 09/28/15 0937 09/29/15 0654  NA 136 138  K 3.9 4.4  CL 101 102  CO2 26 28  GLUCOSE 119* 167*  BUN 9 8  CREATININE 0.44 0.47  CALCIUM 8.9 9.2  AST 32  --   ALT 12*  --   ALKPHOS 92  --   BILITOT 0.7  --     Cardiac Enzymes No results for input(s): TROPONINI in the last 168 hours.  Microbiology Results  Results for orders placed or performed during the hospital encounter of 09/28/15  MRSA PCR Screening     Status: None   Collection Time: 09/28/15  4:31 PM  Result Value Ref Range Status   MRSA by PCR NEGATIVE  NEGATIVE Final    Comment:        The GeneXpert MRSA Assay (FDA approved for NASAL specimens only), is one component of a comprehensive MRSA colonization surveillance program. It is not intended to diagnose MRSA infection nor to guide or monitor treatment for MRSA infections.     RADIOLOGY:  No results found.  EKG:   Orders placed or performed during the hospital encounter of 06/14/15  . ED EKG  . ED EKG  . EKG 12-Lead  . EKG 12-Lead  . EKG      Management plans discussed with the patient, family and they are in agreement.  CODE STATUS:   TOTAL TIME TAKING CARE OF THIS PATIENT: 40 minutes.    Theodoro Grist M.D on  09/30/2015 at 2:46 PM  Between 7am to 6pm - Pager - [phone]  After 6pm go to [email or web address] - password [email or web address] [facility]  Office  [phone]  CC: Primary care physician; Arrie Aran PATRICIA, DO

## 2015-09-30 NOTE — Discharge Instructions (Signed)
*  Notify your doctor if your symptoms return and/or worsen.  *Take all med as prescribed.  *Call your doctor for any questions or concerns.

## 2015-10-25 ENCOUNTER — Ambulatory Visit
Admission: RE | Admit: 2015-10-25 | Discharge: 2015-10-25 | Disposition: A | Discharge status: Home / Self Care | Payer: Medicaid Other | Source: Ambulatory Visit | Attending: Unknown Physician Specialty | Admitting: Unknown Physician Specialty

## 2015-10-25 ENCOUNTER — Ambulatory Visit: Payer: Medicaid Other | Admitting: Anesthesiology

## 2015-10-25 ENCOUNTER — Encounter: Payer: Self-pay | Admitting: *Deleted

## 2015-10-25 ENCOUNTER — Encounter
Admission: RE | Disposition: A | Discharge status: Home / Self Care | Payer: Self-pay | Source: Ambulatory Visit | Attending: Unknown Physician Specialty

## 2015-10-25 DIAGNOSIS — Z01818 Encounter for other preprocedural examination: Secondary | ICD-10-CM | POA: Insufficient documentation

## 2015-10-25 DIAGNOSIS — Z794 Long term (current) use of insulin: Secondary | ICD-10-CM | POA: Insufficient documentation

## 2015-10-25 DIAGNOSIS — K2960 Other gastritis without bleeding: Secondary | ICD-10-CM | POA: Insufficient documentation

## 2015-10-25 DIAGNOSIS — Z87891 Personal history of nicotine dependence: Secondary | ICD-10-CM | POA: Insufficient documentation

## 2015-10-25 DIAGNOSIS — Z79899 Other long term (current) drug therapy: Secondary | ICD-10-CM | POA: Diagnosis not present

## 2015-10-25 DIAGNOSIS — F419 Anxiety disorder, unspecified: Secondary | ICD-10-CM | POA: Diagnosis not present

## 2015-10-25 DIAGNOSIS — E119 Type 2 diabetes mellitus without complications: Secondary | ICD-10-CM | POA: Insufficient documentation

## 2015-10-25 DIAGNOSIS — I10 Essential (primary) hypertension: Secondary | ICD-10-CM | POA: Diagnosis not present

## 2015-10-25 DIAGNOSIS — Z6841 Body Mass Index (BMI) 40.0 and over, adult: Secondary | ICD-10-CM | POA: Diagnosis not present

## 2015-10-25 HISTORY — PX: ESOPHAGOGASTRODUODENOSCOPY (EGD) WITH PROPOFOL: SHX5813

## 2015-10-25 LAB — GLUCOSE, CAPILLARY: GLUCOSE-CAPILLARY: 121 mg/dL — AB (ref 65–99)

## 2015-10-25 LAB — POCT PREGNANCY, URINE: Preg Test, Ur: NEGATIVE

## 2015-10-25 SURGERY — ESOPHAGOGASTRODUODENOSCOPY (EGD) WITH PROPOFOL
Anesthesia: General

## 2015-10-25 MED ORDER — MIDAZOLAM HCL 2 MG/2ML IJ SOLN
INTRAMUSCULAR | Status: DC | PRN
  Administered 2015-10-25: 2 mg via INTRAVENOUS

## 2015-10-25 MED ORDER — SODIUM CHLORIDE 0.9 % IV SOLN
INTRAVENOUS | Status: DC
  Administered 2015-10-25: 08:00:00 via INTRAVENOUS

## 2015-10-25 MED ORDER — PROPOFOL 10 MG/ML IV BOLUS
INTRAVENOUS | Status: DC | PRN
  Administered 2015-10-25 (×3): 20 mg via INTRAVENOUS

## 2015-10-25 MED ORDER — BUTAMBEN-TETRACAINE-BENZOCAINE 2-2-14 % EX AERO
INHALATION_SPRAY | CUTANEOUS | Status: DC | PRN
  Administered 2015-10-25: 1 via TOPICAL

## 2015-10-25 MED ORDER — PROPOFOL 500 MG/50ML IV EMUL
INTRAVENOUS | Status: DC | PRN
  Administered 2015-10-25: 140 ug/kg/min via INTRAVENOUS

## 2015-10-25 MED ORDER — LIDOCAINE HCL (CARDIAC) 20 MG/ML IV SOLN
INTRAVENOUS | Status: DC | PRN
  Administered 2015-10-25: 80 mg via INTRAVENOUS

## 2015-10-25 MED ORDER — SODIUM CHLORIDE 0.9 % IV SOLN: INTRAVENOUS | Status: DC

## 2015-10-25 NOTE — H&P (Signed)
Primary Care Physician:  Gearldine Shown, DO Primary Gastroenterologist:  Dr. Vira Agar  Pre-Procedure History & Physical: HPI:  Gabrielle Sampson is a 44 y.o. female is here for an endoscopy.   Past Medical History  Diagnosis Date  . Diabetes ([place])   . Osteoarthritis   . Fatigue   . HBP (high blood pressure)   . Reflux   . Anxiety   . Pancreatitis   . Morbid obesity [facility])     Past Surgical History  Procedure Laterality Date  . Cesarean section  [place]  . Gallbladder surgery  1996  . Knee surgery Right 2012  . Tonsillectomy and adenoidectomy    . Anal fissure repair    . Abscess drainage      MRSA from abdominal wound    Prior to Admission medications   Medication Sig Start Date End Date Taking? Authorizing Provider  citalopram (CELEXA) 40 MG tablet Take 40 mg by mouth daily.   Yes Historical Provider, MD  cyanocobalamin (,VITAMIN B-12,) 1000 MCG/ML injection Inject 1,000 mcg into the muscle every 30 (thirty) days. On the 1st Monday of each month   Yes Historical Provider, MD  docusate sodium (COLACE) 100 MG capsule Take 100 mg by mouth 2 (two) times daily as needed for mild constipation or moderate constipation.    Yes Historical Provider, MD  gabapentin (NEURONTIN) 400 MG capsule Take 400 mg by mouth See admin instructions. 2 capsules every morning, 1 capsule every lunch, and 2 capsules at bedtime   Yes Historical Provider, MD  glipiZIDE (GLUCOTROL) 5 MG tablet Take 5 mg by mouth every morning.   Yes Historical Provider, MD  LANTUS SOLOSTAR 100 UNIT/ML Solostar Pen Inject 5 Units into the skin at bedtime.   Yes Historical Provider, MD  metoprolol (LOPRESSOR) 50 MG tablet Take 50 mg by mouth 2 (two) times daily.   Yes Historical Provider, MD  norgestimate-ethinyl estradiol (ORTHO-CYCLEN,SPRINTEC,PREVIFEM) 0.25-35 MG-MCG tablet Take 1 tablet by mouth daily.   Yes Historical Provider, MD  nystatin (MYCOSTATIN/NYSTOP) 100000 UNIT/GM POWD Apply 100,000  Units topically 2 (two) times daily.   Yes Historical Provider, MD  omeprazole (PRILOSEC) 40 MG capsule Take 40 mg by mouth daily.   Yes Historical Provider, MD  ALPRAZolam Duanne Moron) 0.5 MG tablet Take 0.5 mg by mouth 2 (two) times daily as needed for anxiety or sleep.     Historical Provider, MD  diphenhydrAMINE (BENADRYL) 50 MG capsule Take 1 capsule (50 mg total) by mouth every 6 (six) hours as needed for itching or allergies. 09/30/15   Theodoro Grist, MD  famotidine (PEPCID) 20 MG tablet Take 1 tablet (20 mg total) by mouth 2 (two) times daily. Patient not taking: Reported on 10/25/2015 09/30/15   Theodoro Grist, MD  Hydrocodone-Acetaminophen 5-300 MG TABS Take 1 tablet by mouth as needed (for pain). Every 4 to 6 hours    Historical Provider, MD  methylPREDNISolone (MEDROL DOSEPAK) 4 MG TBPK tablet follow package directions Patient not taking: Reported on 10/25/2015 09/30/15   Theodoro Grist, MD  phenol-menthol (CEPASTAT) 14.5 MG lozenge Place 1 lozenge inside cheek as needed for sore throat. 09/30/15   Theodoro Grist, MD    Allergies as of 10/22/2015 - Review Complete 09/28/2015  Allergen Reaction Noted  . Sulfa antibiotics Hives 06/14/2015  . Betadine [povidone iodine] Hives 08/19/2013  . Codeine phosphate  01/19/2007  . Iodine  08/18/2013  . Lisinopril Swelling 09/28/2015  . Morphine and related  08/18/2013    Family History  Problem Relation Age of Onset  . Hypothyroidism Mother   . Melanoma Mother   . CAD Father     Social History   Social History  . Marital Status: Divorced    Spouse Name: N/A  . Number of Children: N/A  . Years of Education: N/A   Occupational History  . Not on file.   Social History Main Topics  . Smoking status: Former Smoker    Types: Cigarettes    Quit date: 08/08/2013  . Smokeless tobacco: Never Used  . Alcohol Use: No  . Drug Use: No  . Sexual Activity: Not on file   Other Topics Concern  . Not on file   Social History Narrative   Lives  at home with her husband, independent at baseline.    Review of Systems: See HPI, otherwise negative ROS  Physical Exam: BP 141/74 mmHg  Pulse 78  Temp(Src) 97.5 F (36.4 C) (Tympanic)  Resp 14  Ht 5\' 4"  (1.626 m)  Wt 149.687 kg (330 lb)  BMI 56.62 kg/m2  SpO2 98%  LMP  (LMP Unknown) General:   Alert,  pleasant and cooperative in NAD Head:  Normocephalic and atraumatic. Neck:  Supple; no masses or thyromegaly. Lungs:  Clear throughout to auscultation.    Heart:  Regular rate and rhythm. Abdomen:  Soft, nontender and nondistended. Normal bowel sounds, without guarding, and without rebound.   Neurologic:  Alert and  oriented x4;  grossly normal neurologically.  Impression/Plan: Gabrielle Sampson is here for an endoscopy to be performed for evaluation for bariatric surgery  Risks, benefits, limitations, and alternatives regarding  endoscopy have been reviewed with the patient.  Questions have been answered.  All parties agreeable.   Gaylyn Cheers, MD  10/25/2015, 8:14 AM

## 2015-10-25 NOTE — Transfer of Care (Signed)
Immediate Anesthesia Transfer of Care Note  Patient: Gabrielle Sampson  Procedure(s) Performed: Procedure(s): ESOPHAGOGASTRODUODENOSCOPY (EGD) WITH PROPOFOL (N/A)  Patient Location: PACU  Anesthesia Type:General  Level of Consciousness: awake and alert   Airway & Oxygen Therapy: Patient Spontanous Breathing and Patient connected to nasal cannula oxygen  Post-op Assessment: Report given to RN and Post -op Vital signs reviewed and stable  Post vital signs: Reviewed and stable  Last Vitals:  Filed Vitals:   10/25/15 0710 10/25/15 0836  BP: 141/74 133/78  Pulse: 78 78  Temp: 36.4 C 36.4 C  Resp: 14 19    Complications: No apparent anesthesia complications

## 2015-10-25 NOTE — Op Note (Signed)
[facility] Gastroenterology Patient Name: Gabrielle Sampson Procedure Date: 10/25/2015 8:15 AM MRN: [id number] Account #: [id number] Date of Birth: [birth year] Admit Type: Outpatient Age: 44 Room: [facility] ENDO ROOM 1 Gender: Female Note Status: Finalized Procedure:         Upper GI endoscopy Indications:       Preoperative assessment for bariatric surgery to treat                     morbid obesity Providers:         Manya Silvas, MD Referring MD:      Ardyth Man. Santayana (Referring MD) Medicines:         Propofol per Anesthesia, Cetacaine spray Complications:     No immediate complications. Procedure:         Pre-Anesthesia Assessment:                    - After reviewing the risks and benefits, the patient was                     deemed in satisfactory condition to undergo the procedure.                    After obtaining informed consent, the endoscope was passed                     under direct vision. Throughout the procedure, the                     patient's blood pressure, pulse, and oxygen saturations                     were monitored continuously. The Olympus GIF-160 endoscope                     (S#. [id number]) was introduced through the mouth, and                     advanced to the second part of duodenum. The upper GI                     endoscopy was accomplished without difficulty. The patient                     tolerated the procedure well. Findings:      The examined esophagus was normal. GEJ 43cm.      Patchy mildly erythematous mucosa wih some swelling of antral folds       without bleeding was found in the gastric antrum. Biopsies were taken       with a cold forceps for histology. Biopsies were taken with a cold       forceps for Helicobacter pylori testing. Biopsies done of body also but       no inflammation seen.      The examined duodenum was normal. Impression:        - Normal esophagus.                    - Erythematous mucosa in the  antrum. Biopsied.                    - Normal examined duodenum. Recommendation:    - Await pathology results. Manya Silvas, MD 10/25/2015 8:37:22 AM This report has been signed electronically. Number  of Addenda: 0 Note Initiated On: 10/25/2015 8:15 AM      [facility]

## 2015-10-25 NOTE — Anesthesia Preprocedure Evaluation (Signed)
Anesthesia Evaluation  Patient identified by MRN, date of birth, ID band Patient awake    Reviewed: Allergy & Precautions, H&P , NPO status , Patient's Chart, lab work & pertinent test results  History of Anesthesia Complications Negative for: history of anesthetic complications  Airway Mallampati: III  TM Distance: >3 FB Neck ROM: limited    Dental  (+) Poor Dentition, Chipped   Pulmonary former smoker,    Pulmonary exam normal breath sounds clear to auscultation       Cardiovascular Exercise Tolerance: Good hypertension, (-) angina(-) Past MI and (-) DOE Normal cardiovascular exam Rhythm:regular Rate:Normal     Neuro/Psych PSYCHIATRIC DISORDERS Anxiety Depression negative neurological ROS     GI/Hepatic negative GI ROS, Neg liver ROS,   Endo/Other  diabetes, Type 2, Insulin DependentMorbid obesity  Renal/GU negative Renal ROS  negative genitourinary   Musculoskeletal  (+) Arthritis ,   Abdominal   Peds  Hematology negative hematology ROS (+)   Anesthesia Other Findings Past Medical History:   Diabetes ([facility])                                               Osteoarthritis                                               Fatigue                                                      HBP (high blood pressure)                                    Reflux                                                       Anxiety                                                      Pancreatitis                                                 Morbid obesity ([place])                                        Past Surgical History:   CESAREAN SECTION  [place]         KNEE SURGERY                                    Right 2012         TONSILLECTOMY AND ADENOIDECTOMY                               ANAL FISSURE REPAIR                                          ABSCESS DRAINAGE                                                Comment:MRSA from abdominal wound  BMI    Body Mass Index   56.61 kg/m 2  Super Morbid Obesity     Reproductive/Obstetrics negative OB ROS                             Anesthesia Physical Anesthesia Plan  ASA: IV  Anesthesia Plan: General   Post-op Pain Management:    Induction:   Airway Management Planned:   Additional Equipment:   Intra-op Plan:   Post-operative Plan:   Informed Consent: I have reviewed the patients History and Physical, chart, labs and discussed the procedure including the risks, benefits and alternatives for the proposed anesthesia with the patient or authorized representative who has indicated his/her understanding and acceptance.   Dental Advisory Given  Plan Discussed with: Anesthesiologist, CRNA and Surgeon  Anesthesia Plan Comments:         Anesthesia Quick Evaluation

## 2015-10-25 NOTE — Anesthesia Postprocedure Evaluation (Signed)
Anesthesia Post Note  Patient: Fradel Prideaux  Procedure(s) Performed: Procedure(s) (LRB): ESOPHAGOGASTRODUODENOSCOPY (EGD) WITH PROPOFOL (N/A)  Patient location during evaluation: Endoscopy Anesthesia Type: General Level of consciousness: awake and alert Pain management: pain level controlled Vital Signs Assessment: post-procedure vital signs reviewed and stable Respiratory status: spontaneous breathing, nonlabored ventilation, respiratory function stable and patient connected to nasal cannula oxygen Cardiovascular status: blood pressure returned to baseline and stable Postop Assessment: no signs of nausea or vomiting Anesthetic complications: no    Last Vitals:  Filed Vitals:   10/25/15 0900 10/25/15 0910  BP: 132/77 130/84  Pulse:    Temp:    Resp:      Last Pain:  Filed Vitals:   10/25/15 0916  PainSc: 0-No pain                 Precious Haws Piscitello

## 2015-10-27 ENCOUNTER — Encounter: Payer: Self-pay | Admitting: Unknown Physician Specialty

## 2015-10-29 LAB — SURGICAL PATHOLOGY

## 2015-12-05 ENCOUNTER — Other Ambulatory Visit: Payer: Self-pay | Admitting: Specialist

## 2015-12-05 DIAGNOSIS — M1711 Unilateral primary osteoarthritis, right knee: Secondary | ICD-10-CM

## 2015-12-17 DIAGNOSIS — M722 Plantar fascial fibromatosis: Secondary | ICD-10-CM

## 2015-12-21 ENCOUNTER — Other Ambulatory Visit: Payer: Self-pay | Admitting: Family Medicine

## 2016-01-01 ENCOUNTER — Ambulatory Visit
Admission: RE | Admit: 2016-01-01 | Discharge: 2016-01-01 | Disposition: A | Discharge status: Home / Self Care | Payer: Medicaid Other | Source: Ambulatory Visit | Attending: Specialist | Admitting: Specialist

## 2016-01-01 DIAGNOSIS — M25461 Effusion, right knee: Secondary | ICD-10-CM | POA: Diagnosis not present

## 2016-01-01 DIAGNOSIS — M1711 Unilateral primary osteoarthritis, right knee: Secondary | ICD-10-CM | POA: Diagnosis not present

## 2016-01-01 DIAGNOSIS — D649 Anemia, unspecified: Secondary | ICD-10-CM | POA: Diagnosis not present

## 2016-02-26 IMAGING — MR MR KNEE*R* W/O CM
5 series · 38 of 40 positions shown · non-contrast
Comparison: 11/24/2009

CLINICAL DATA: Medial knee pain in weakness over the past 4 months.
Prior knee surgery 4 years ago.

EXAM:
MRI OF THE RIGHT KNEE WITHOUT CONTRAST
TECHNIQUE: Multiplanar, multisequence MR imaging of the knee was performed. No
intravenous contrast was administered.

[Series 3: PD fat-sat · axial · 3.0mm · 0.33mm/px · z∈[-56,+43]mm · 8 of 31 slices shown (1 of 3)]
[im 1/31]
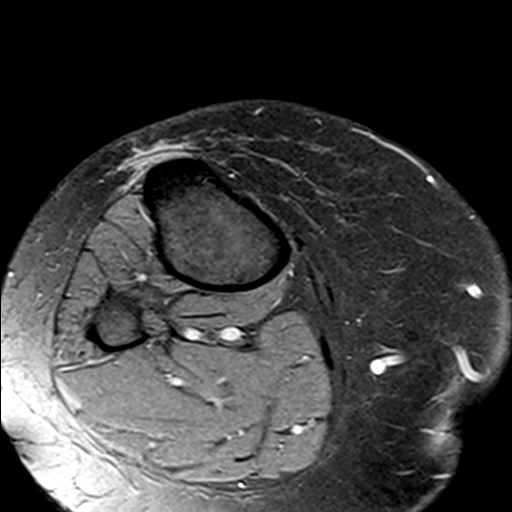
[im 5/31]
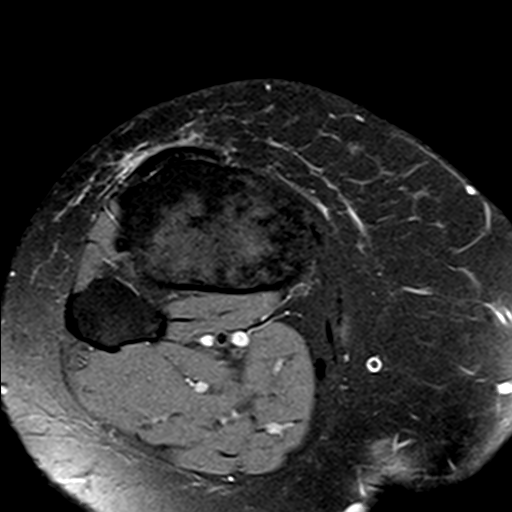
[im 9/31]
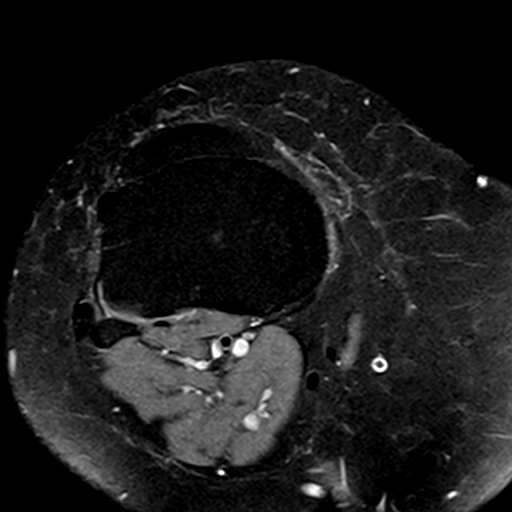
[im 13/31]
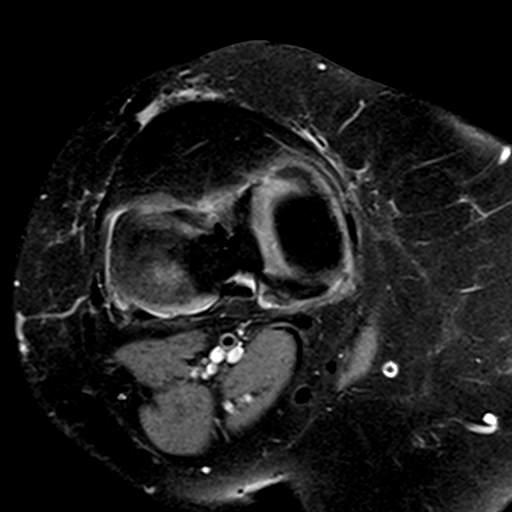
[im 18/31]
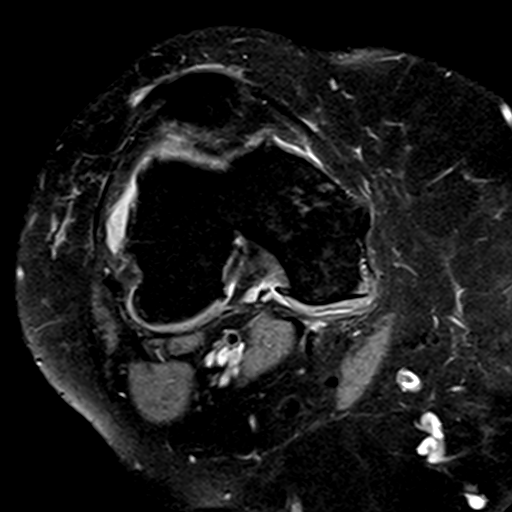
[im 22/31]
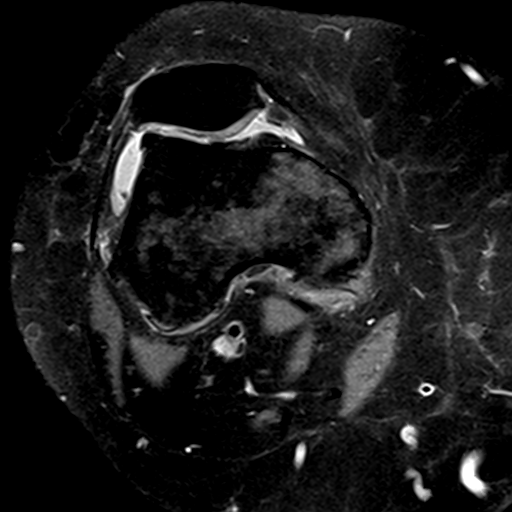
[im 26/31]
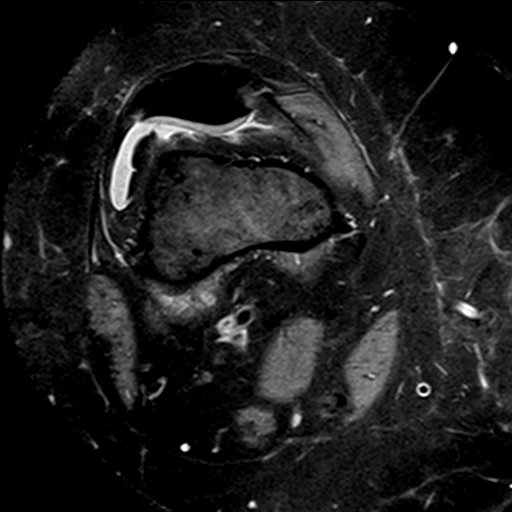
[im 31/31]
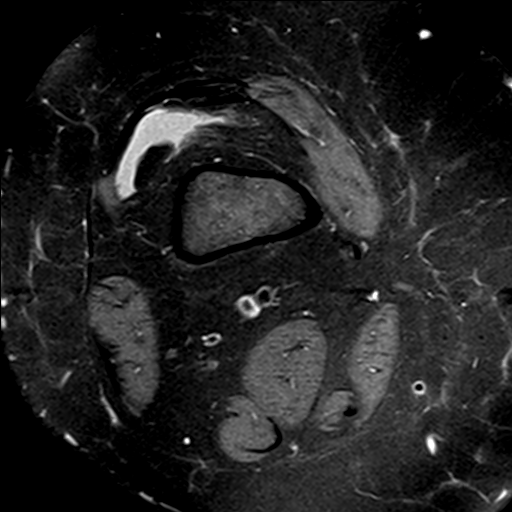

[Series 4: T1 · coronal · 3.0mm · 0.50mm/px · 6 of 32 slices shown]
[im 1/32]
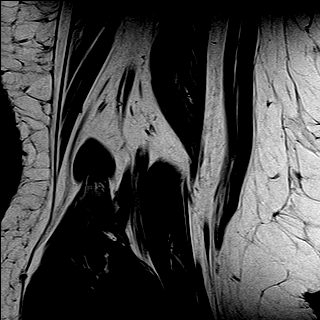
[im 5/32]
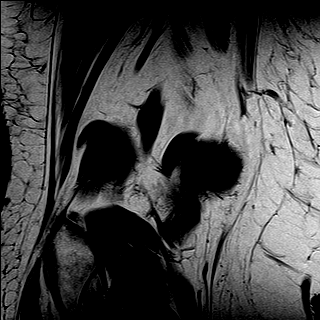
[im 9/32]
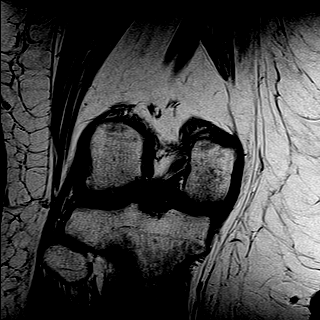
[im 14/32]
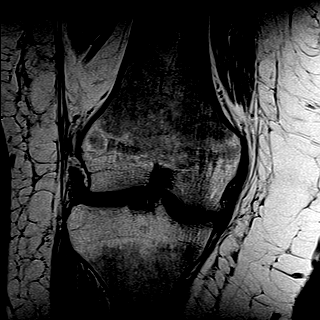
[im 18/32]
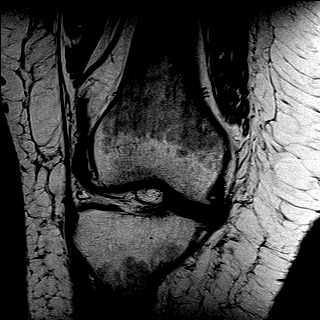
[im 23/32]
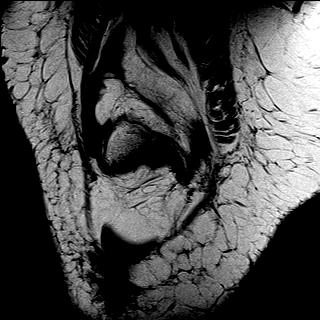

[Series 5: T2 fat-sat · coronal · 3.0mm · 0.50mm/px · 8 of 32 slices shown]
[im 1/32]
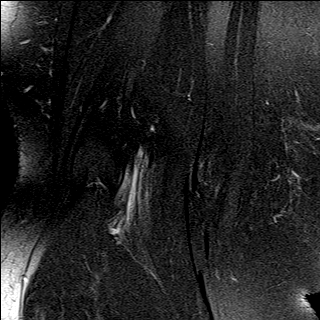
[im 5/32]
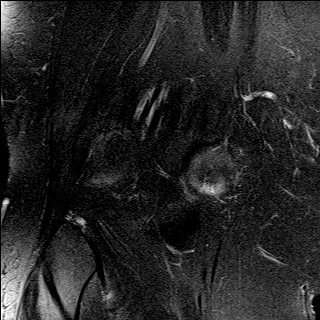
[im 9/32]
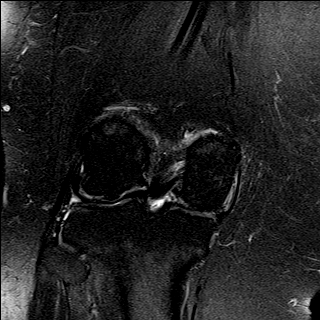
[im 14/32]
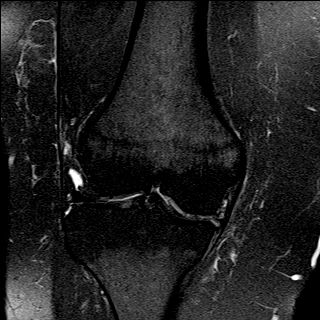
[im 18/32]
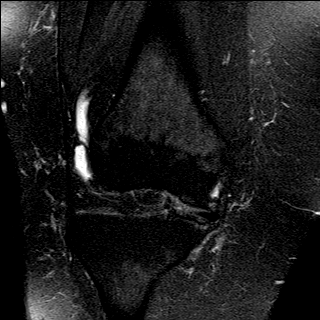
[im 23/32]
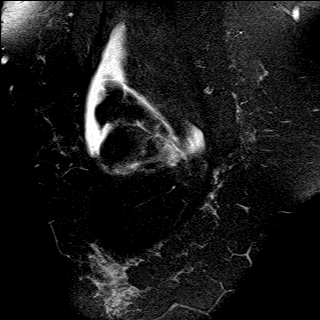
[im 27/32]
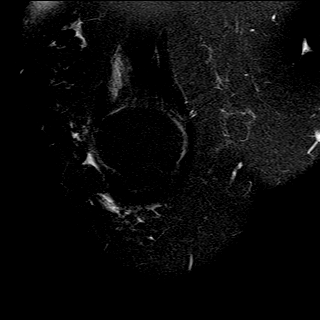
[im 32/32]
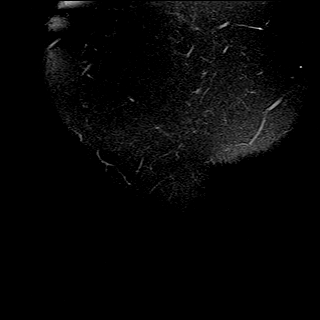

[Series 6: PD fat-sat · sagittal · 3.0mm · 0.62mm/px · 8 of 32 slices shown (2 of 3)]
[im 1/32]
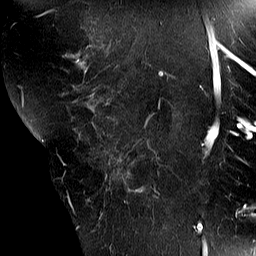
[im 5/32]
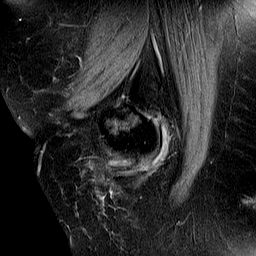
[im 9/32]
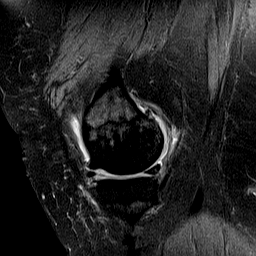
[im 14/32]
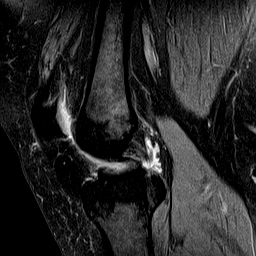
[im 18/32]
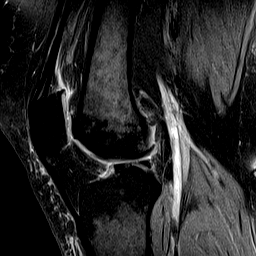
[im 23/32]
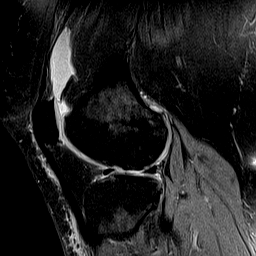
[im 27/32]
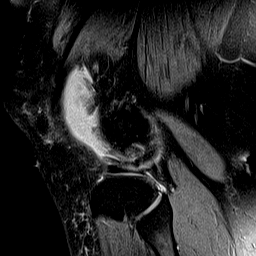
[im 32/32]
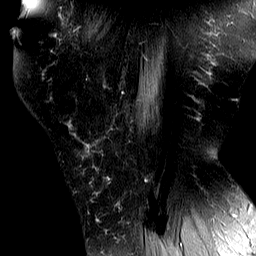

[Series 7: PD fat-sat · coronal · 3.0mm · 0.62mm/px · 8 of 32 slices shown (3 of 3)]
[im 1/32]
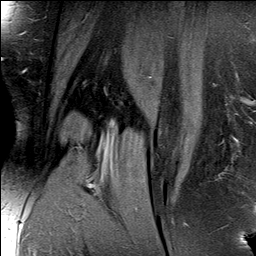
[im 5/32]
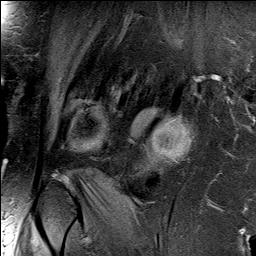
[im 9/32]
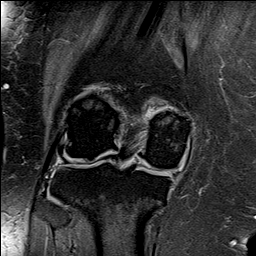
[im 14/32]
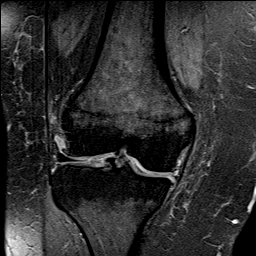
[im 18/32]
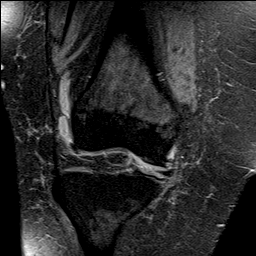
[im 23/32]
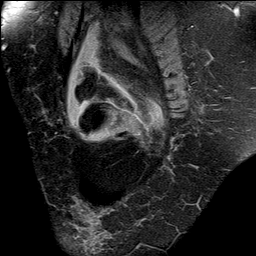
[im 27/32]
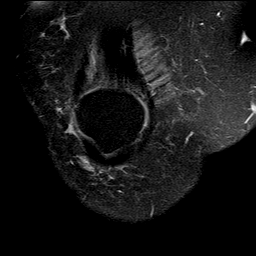
[im 32/32]
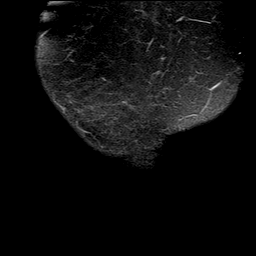

[38 of 40 positions shown; findings below may reference images not displayed]

FINDINGS: MENISCI

Medial meniscus: Very diminutive rim of residual tissue in the
midbody and adjacent posterior horn medial meniscus, representing
either tear or partial meniscectomy since the prior exam of
11/24/2009.

Lateral meniscus: Mild diminution in size of the posterior horn
lateral meniscus compared to previous, again probably from partial
meniscectomy, less likely due to an otherwise occult tear.

LIGAMENTS

Cruciates:  Unremarkable

Collaterals:  Unremarkable

CARTILAGE

Patellofemoral: Moderate degenerative chondral thinning and chondral
irregularity in the femoral trochlear groove, lateral patellar
facet, and posterior patellar ridge. Marginal spurring.

Medial: Moderate degenerative chondral thinning and chondral
irregularity with marginal spurring.

Lateral: Moderate degenerative chondral thinning and chondral
irregularity with both marginal spurring and articular spurring
along the lateral femoral condyle.

Joint:  Small knee effusion.

Popliteal Fossa:  Unremarkable

Extensor Mechanism:  Unremarkable

Bones: Red marrow noted. This is mildly prominent for age and may be
a response to anemia.
IMPRESSION: 1. Diminution in size of the midbody and posterior horn of the
medial meniscus, probably from partial meniscectomy, less likely
tear.
2. Similar appearance of the mild reduction in size of the posterior
horn lateral meniscus, favor partial meniscectomy, less likely tear,
correlate with prior operative intervention.
3. Moderate degenerative chondral thinning and tricompartmental
chondral irregularity and spurring.
4. Small knee effusion.
5. Mildly prominent red marrow for age, probably a response to
anemia.

## 2016-02-29 ENCOUNTER — Emergency Department: Payer: Medicaid Other

## 2016-02-29 ENCOUNTER — Emergency Department
Admission: EM | Admit: 2016-02-29 | Discharge: 2016-03-01 | Disposition: A | Discharge status: Home / Self Care | Payer: Medicaid Other | Attending: Emergency Medicine | Admitting: Emergency Medicine

## 2016-02-29 ENCOUNTER — Encounter: Payer: Self-pay | Admitting: Emergency Medicine

## 2016-02-29 DIAGNOSIS — T783XXA Angioneurotic edema, initial encounter: Secondary | ICD-10-CM | POA: Diagnosis not present

## 2016-02-29 DIAGNOSIS — D239 Other benign neoplasm of skin, unspecified: Secondary | ICD-10-CM | POA: Insufficient documentation

## 2016-02-29 DIAGNOSIS — Z79899 Other long term (current) drug therapy: Secondary | ICD-10-CM | POA: Insufficient documentation

## 2016-02-29 DIAGNOSIS — Z7952 Long term (current) use of systemic steroids: Secondary | ICD-10-CM | POA: Diagnosis not present

## 2016-02-29 DIAGNOSIS — E1140 Type 2 diabetes mellitus with diabetic neuropathy, unspecified: Secondary | ICD-10-CM | POA: Diagnosis not present

## 2016-02-29 DIAGNOSIS — Z7984 Long term (current) use of oral hypoglycemic drugs: Secondary | ICD-10-CM | POA: Insufficient documentation

## 2016-02-29 DIAGNOSIS — R0789 Other chest pain: Secondary | ICD-10-CM | POA: Insufficient documentation

## 2016-02-29 DIAGNOSIS — M549 Dorsalgia, unspecified: Secondary | ICD-10-CM | POA: Insufficient documentation

## 2016-02-29 DIAGNOSIS — I10 Essential (primary) hypertension: Secondary | ICD-10-CM | POA: Diagnosis not present

## 2016-02-29 DIAGNOSIS — M1990 Unspecified osteoarthritis, unspecified site: Secondary | ICD-10-CM | POA: Insufficient documentation

## 2016-02-29 DIAGNOSIS — F329 Major depressive disorder, single episode, unspecified: Secondary | ICD-10-CM | POA: Diagnosis not present

## 2016-02-29 DIAGNOSIS — R0602 Shortness of breath: Secondary | ICD-10-CM | POA: Insufficient documentation

## 2016-02-29 DIAGNOSIS — R079 Chest pain, unspecified: Secondary | ICD-10-CM

## 2016-02-29 DIAGNOSIS — F17210 Nicotine dependence, cigarettes, uncomplicated: Secondary | ICD-10-CM | POA: Diagnosis not present

## 2016-02-29 DIAGNOSIS — M5489 Other dorsalgia: Secondary | ICD-10-CM

## 2016-02-29 DIAGNOSIS — R110 Nausea: Secondary | ICD-10-CM | POA: Insufficient documentation

## 2016-02-29 HISTORY — DX: Polyneuropathy, unspecified: G62.9

## 2016-02-29 LAB — BASIC METABOLIC PANEL
ANION GAP: 10 (ref 5–15)
BUN: 12 mg/dL (ref 6–20)
CALCIUM: 9.3 mg/dL (ref 8.9–10.3)
CO2: 24 mmol/L (ref 22–32)
CREATININE: 0.61 mg/dL (ref 0.44–1.00)
Chloride: 99 mmol/L — ABNORMAL LOW (ref 101–111)
GFR calc Af Amer: >60 mL/min (ref >60)
GFR calc non Af Amer: >60 mL/min (ref >60)
GLUCOSE: 146 mg/dL — AB (ref 65–99)
Potassium: 3.7 mmol/L (ref 3.5–5.1)
Sodium: 133 mmol/L — ABNORMAL LOW (ref 135–145)

## 2016-02-29 LAB — CBC
HCT: 34.5 % — ABNORMAL LOW (ref 35.0–47.0)
HEMOGLOBIN: 11.9 g/dL — AB (ref 12.0–16.0)
MCH: 29.2 pg (ref 26.0–34.0)
MCHC: 34.3 g/dL (ref 32.0–36.0)
MCV: 84.9 fL (ref 80.0–100.0)
Platelets: 263 10*3/uL (ref 150–440)
RBC: 4.06 MIL/uL (ref 3.80–5.20)
RDW: 13.5 % (ref 11.5–14.5)
WBC: 10.7 10*3/uL (ref 3.6–11.0)

## 2016-02-29 LAB — TROPONIN I

## 2016-02-29 MED ORDER — ACETAMINOPHEN 325 MG PO TABS
650.0000 mg | ORAL_TABLET | Freq: Once | ORAL | Status: AC
  Administered 2016-02-29: 650 mg via ORAL
  Filled 2016-02-29: qty 2

## 2016-02-29 MED ORDER — ONDANSETRON HCL 4 MG/2ML IJ SOLN
4.0000 mg | Freq: Once | INTRAMUSCULAR | Status: AC
  Administered 2016-02-29: 4 mg via INTRAVENOUS

## 2016-02-29 MED ORDER — ONDANSETRON HCL 4 MG/2ML IJ SOLN
4.0000 mg | Freq: Once | INTRAMUSCULAR | Status: AC
  Administered 2016-02-29: 4 mg via INTRAVENOUS
  Filled 2016-02-29: qty 2

## 2016-02-29 MED ORDER — ONDANSETRON HCL 4 MG/2ML IJ SOLN
INTRAMUSCULAR | Status: AC
  Administered 2016-02-29: 4 mg via INTRAVENOUS
  Filled 2016-02-29: qty 2

## 2016-02-29 MED ORDER — NITROGLYCERIN 0.4 MG SL SUBL
0.4000 mg | SUBLINGUAL_TABLET | SUBLINGUAL | Status: DC | PRN
  Administered 2016-02-29: 0.4 mg via SUBLINGUAL
  Filled 2016-02-29: qty 1

## 2016-02-29 NOTE — ED Notes (Signed)
Patient brought in by ems with complaint of central chest pain radiating to her back. Patient with some nausea.

## 2016-02-29 NOTE — ED Notes (Signed)
Pt to xray

## 2016-03-01 ENCOUNTER — Emergency Department: Payer: Medicaid Other

## 2016-03-01 LAB — TROPONIN I: Troponin I: <0.03 ng/mL (ref <0.031)

## 2016-03-01 MED ORDER — DIAZEPAM 2 MG PO TABS
2.0000 mg | ORAL_TABLET | Freq: Once | ORAL | Status: AC
  Administered 2016-03-01: 2 mg via ORAL
  Filled 2016-03-01: qty 1

## 2016-03-01 MED ORDER — DIPHENHYDRAMINE HCL 50 MG/ML IJ SOLN
25.0000 mg | Freq: Once | INTRAMUSCULAR | Status: AC
  Administered 2016-03-01: 25 mg via INTRAVENOUS
  Filled 2016-03-01: qty 1

## 2016-03-01 MED ORDER — IOPAMIDOL (ISOVUE-370) INJECTION 76%
100.0000 mL | Freq: Once | INTRAVENOUS | Status: AC | PRN
  Administered 2016-03-01: 100 mL via INTRAVENOUS

## 2016-03-01 NOTE — ED Provider Notes (Signed)
[facility] Emergency Department Provider Note  ____________________________________________  Time seen: Approximately 12:41 AM  I have reviewed the triage vital signs and the nursing notes.   HISTORY  Chief Complaint Chest Pain    HPI Gabrielle Sampson is a 45 y.o. female who presents to the ED from home via EMS with a chief complain of chest pain. Patient reports awakening from a nap at approximately 4 PM with a sensation of chest tightness radiating to her back. Symptoms associated with some nausea and feelings of shortness of breath. Denies diaphoresis, palpitations, vomiting, dizziness. Denies recent fever, chills, abdominal pain, diarrhea. Denies recent travel or trauma. Reports baseline anxiety. Nothing makes her symptoms better or worse.   Past Medical History  Diagnosis Date  . Diabetes ([place])   . Osteoarthritis   . Fatigue   . HBP (high blood pressure)   . Reflux   . Anxiety   . Pancreatitis   . Morbid obesity ([place])   . Neuropathy [facility])     Patient Active Problem List   Diagnosis Date Noted  . Dysphagia 09/30/2015  . Benign essential HTN 09/30/2015  . Diabetes ([place]) 09/30/2015  . Obesity 09/30/2015  . Angioedema 09/28/2015  . ABDOMINAL PAIN, EPIGASTRIC 06/18/2007  . PANCREATITIS 05/10/2007  . URTICARIA 05/10/2007  . ANXIETY 04/29/2007  . DEPRESSION 04/29/2007  . HYPERTENSION 04/29/2007  . DERMATOFIBROMA 02/22/2007  . IRRITABLE BOWEL SYNDROME 02/22/2007  . SKIN TAG 02/22/2007  . SLEEP DISORDER 02/22/2007    Past Surgical History  Procedure Laterality Date  . Cesarean section  [place]  . Gallbladder surgery  1996  . Knee surgery Right 2012  . Tonsillectomy and adenoidectomy    . Anal fissure repair    . Abscess drainage      MRSA from abdominal wound  . Esophagogastroduodenoscopy (egd) with propofol N/A 10/25/2015    Procedure: ESOPHAGOGASTRODUODENOSCOPY (EGD) WITH PROPOFOL;  Surgeon: Manya Silvas, MD;   Location: [place];  Service: Endoscopy;  Laterality: N/A;  . Tubal ligation      Current Outpatient Rx  Name  Route  Sig  Dispense  Refill  . ALPRAZolam (XANAX) 0.5 MG tablet   Oral   Take 0.5 mg by mouth 2 (two) times daily as needed for anxiety or sleep.          . citalopram (CELEXA) 40 MG tablet   Oral   Take 40 mg by mouth daily.         . cyanocobalamin (,VITAMIN B-12,) 1000 MCG/ML injection   Intramuscular   Inject 1,000 mcg into the muscle every 30 (thirty) days. On the 1st Monday of each month         . diphenhydrAMINE (BENADRYL) 50 MG capsule   Oral   Take 1 capsule (50 mg total) by mouth every 6 (six) hours as needed for itching or allergies.   30 capsule   0   . docusate sodium (COLACE) 100 MG capsule   Oral   Take 100 mg by mouth 2 (two) times daily as needed for mild constipation or moderate constipation.          . famotidine (PEPCID) 20 MG tablet   Oral   Take 1 tablet (20 mg total) by mouth 2 (two) times daily. Patient not taking: Reported on 10/25/2015   20 tablet   0   . gabapentin (NEURONTIN) 400 MG capsule   Oral   Take 400 mg by mouth See admin instructions. 2 capsules every  morning, 1 capsule every lunch, and 2 capsules at bedtime         . glipiZIDE (GLUCOTROL) 5 MG tablet   Oral   Take 5 mg by mouth every morning.         Marland Kitchen Hydrocodone-Acetaminophen 5-300 MG TABS   Oral   Take 1 tablet by mouth as needed (for pain). Every 4 to 6 hours         . LANTUS SOLOSTAR 100 UNIT/ML Solostar Pen   Subcutaneous   Inject 5 Units into the skin at bedtime.           Dispense as written.   . methylPREDNISolone (MEDROL DOSEPAK) 4 MG TBPK tablet      follow package directions Patient not taking: Reported on 10/25/2015   21 tablet   0   . metoprolol (LOPRESSOR) 50 MG tablet   Oral   Take 50 mg by mouth 2 (two) times daily.         . norgestimate-ethinyl estradiol (ORTHO-CYCLEN,SPRINTEC,PREVIFEM) 0.25-35 MG-MCG tablet    Oral   Take 1 tablet by mouth daily.         Marland Kitchen nystatin (MYCOSTATIN/NYSTOP) 100000 UNIT/GM POWD   Topical   Apply 100,000 Units topically 2 (two) times daily.         Marland Kitchen omeprazole (PRILOSEC) 40 MG capsule   Oral   Take 40 mg by mouth daily.         . phenol-menthol (CEPASTAT) 14.5 MG lozenge   Buccal   Place 1 lozenge inside cheek as needed for sore throat.   100 tablet   0     Allergies Sulfa antibiotics; Betadine; Codeine phosphate; Iodine; Lisinopril; and Morphine and related  Family History  Problem Relation Age of Onset  . Hypothyroidism Mother   . Melanoma Mother   . CAD Father     Social History Social History  Substance Use Topics  . Smoking status: Current Every Day Smoker    Types: Cigarettes    Last Attempt to Quit: 08/08/2013  . Smokeless tobacco: Never Used  . Alcohol Use: No    Review of Systems  Constitutional: No fever/chills. Eyes: No visual changes. ENT: No sore throat. Cardiovascular: Positive for chest pain. Respiratory: Positive for shortness of breath. Gastrointestinal: No abdominal pain.  Positive for nausea, no vomiting.  No diarrhea.  No constipation. Genitourinary: Negative for dysuria. Musculoskeletal: Negative for back pain. Skin: Negative for rash. Neurological: Negative for headaches, focal weakness or numbness.  10-point ROS otherwise negative.  ____________________________________________   PHYSICAL EXAM:  VITAL SIGNS: ED Triage Vitals  Enc Vitals Group     BP 02/29/16 2206 143/78 mmHg     Pulse Rate 02/29/16 2206 89     Resp 02/29/16 2206 18     Temp 02/29/16 2206 98 F (36.7 C)     Temp Source 02/29/16 2206 Oral     SpO2 02/29/16 2206 98 %     Weight 02/29/16 2206 330 lb (149.687 kg)     Height 02/29/16 2206 5\' 4"  (1.626 m)     Head Cir --      Peak Flow --      Pain Score 02/29/16 2207 5     Pain Loc --      Pain Edu? --      Excl. in [place]? --     Constitutional: Alert and oriented. Well appearing  and in no acute distress. Eyes: Conjunctivae are normal. PERRL. EOMI. Head: Atraumatic. Nose: No congestion/rhinnorhea.  Mouth/Throat: Mucous membranes are moist.  Oropharynx non-erythematous. Neck: No stridor.   Cardiovascular: Normal rate, regular rhythm. Grossly normal heart sounds.  Good peripheral circulation. Respiratory: Normal respiratory effort.  No retractions. Lungs CTAB. Gastrointestinal: Obese. Soft and nontender. No distention. No abdominal bruits. No CVA tenderness. Musculoskeletal: No lower extremity tenderness nor edema.  No joint effusions. Neurologic:  Normal speech and language. No gross focal neurologic deficits are appreciated. No gait instability. Skin:  Skin is warm, dry and intact. No rash noted. Psychiatric: Mood and affect are normal. Speech and behavior are normal.  ____________________________________________   LABS (all labs ordered are listed, but only abnormal results are displayed)  Labs Reviewed  BASIC METABOLIC PANEL - Abnormal; Notable for the following:    Sodium 133 (*)    Chloride 99 (*)    Glucose, Bld 146 (*)    All other components within normal limits  CBC - Abnormal; Notable for the following:    Hemoglobin 11.9 (*)    HCT 34.5 (*)    All other components within normal limits  TROPONIN I  TROPONIN I   ____________________________________________  EKG  ED ECG REPORT I, SUNG,JADE J, the attending physician, personally viewed and interpreted this ECG.   Date: 03/01/2016  EKG Time: 2210  Rate: 89  Rhythm: normal EKG, normal sinus rhythm  Axis: Normal  Intervals:none  ST&T Change: T-wave inversion in leads III and aVF No change from June 2016  ____________________________________________  RADIOLOGY  Chest 2 view (viewed by me, interpreted per Dr. Rosario Jacks): Negative.  CTA chest interpreted per Dr. Radene Knee: 1. No evidence of pulmonary embolus. 2. Minimal bibasilar atelectasis noted. Lungs otherwise clear. 3.  Splenomegaly. ____________________________________________   PROCEDURES  Procedure(s) performed: None  Critical Care performed: No  ____________________________________________   INITIAL IMPRESSION / ASSESSMENT AND PLAN / ED COURSE  Pertinent labs & imaging results that were available during my care of the patient were reviewed by me and considered in my medical decision making (see chart for details).  45 year old female with a history of hypertension and diabetes who presents with chest tightness radiating into her back. EKG unchanged from prior, initial troponin negative. Will repeat troponin and obtain CT angiogram to evaluate for pulmonary embolism.  ----------------------------------------- 3:28 AM on 03/01/2016 -----------------------------------------  Updated patient & spouse of CT and repeat troponin results. Patient complaining of chronic upper back pain across her neck; + muscle spasms. Will administer PO Valium. Strict return precautions given. Both verbalize understanding and agree with plan of care. ____________________________________________   FINAL CLINICAL IMPRESSION(S) / ED DIAGNOSES  Final diagnoses:  Chest pain, unspecified chest pain type  Other back pain      Paulette Blanch, MD 03/01/16 [phone]

## 2016-03-01 NOTE — Discharge Instructions (Signed)
1. Continue all daily medicines as directed by your doctor. 2. Return to the ER for worsening symptoms, persistent vomiting, difficulty breathing, or other concerns.  Nonspecific Chest Pain  Chest pain can be caused by many different conditions. There is always a chance that your pain could be related to something serious, such as a heart attack or a blood clot in your lungs. Chest pain can also be caused by conditions that are not life-threatening. If you have chest pain, it is very important to follow up with your health care provider. CAUSES  Chest pain can be caused by:  Heartburn.  Pneumonia or bronchitis.  Anxiety or stress.  Inflammation around your heart (pericarditis) or lung (pleuritis or pleurisy).  A blood clot in your lung.  A collapsed lung (pneumothorax). It can develop suddenly on its own (spontaneous pneumothorax) or from trauma to the chest.  Shingles infection (varicella-zoster virus).  Heart attack.  Damage to the bones, muscles, and cartilage that make up your chest wall. This can include:  Bruised bones due to injury.  Strained muscles or cartilage due to frequent or repeated coughing or overwork.  Fracture to one or more ribs.  Sore cartilage due to inflammation (costochondritis). RISK FACTORS  Risk factors for chest pain may include:  Activities that increase your risk for trauma or injury to your chest.  Respiratory infections or conditions that cause frequent coughing.  Medical conditions or overeating that can cause heartburn.  Heart disease or family history of heart disease.  Conditions or health behaviors that increase your risk of developing a blood clot.  Having had chicken pox (varicella zoster). SIGNS AND SYMPTOMS Chest pain can feel like:  Burning or tingling on the surface of your chest or deep in your chest.  Crushing, pressure, aching, or squeezing pain.  Dull or sharp pain that is worse when you move, cough, or take a deep  breath.  Pain that is also felt in your back, neck, shoulder, or arm, or pain that spreads to any of these areas. Your chest pain may come and go, or it may stay constant. DIAGNOSIS Lab tests or other studies may be needed to find the cause of your pain. Your health care provider may have you take a test called an ambulatory ECG (electrocardiogram). An ECG records your heartbeat patterns at the time the test is performed. You may also have other tests, such as:  Transthoracic echocardiogram (TTE). During echocardiography, sound waves are used to create a picture of all of the heart structures and to look at how blood flows through your heart.  Transesophageal echocardiogram (TEE).This is a more advanced imaging test that obtains images from inside your body. It allows your health care provider to see your heart in finer detail.  Cardiac monitoring. This allows your health care provider to monitor your heart rate and rhythm in real time.  Holter monitor. This is a portable device that records your heartbeat and can help to diagnose abnormal heartbeats. It allows your health care provider to track your heart activity for several days, if needed.  Stress tests. These can be done through exercise or by taking medicine that makes your heart beat more quickly.  Blood tests.  Imaging tests. TREATMENT  Your treatment depends on what is causing your chest pain. Treatment may include:  Medicines. These may include:  Acid blockers for heartburn.  Anti-inflammatory medicine.  Pain medicine for inflammatory conditions.  Antibiotic medicine, if an infection is present.  Medicines to dissolve blood  clots.  Medicines to treat coronary artery disease.  Supportive care for conditions that do not require medicines. This may include:  Resting.  Applying heat or cold packs to injured areas.  Limiting activities until pain decreases. HOME CARE INSTRUCTIONS  If you were prescribed an  antibiotic medicine, finish it all even if you start to feel better.  Avoid any activities that bring on chest pain.  Do not use any tobacco products, including cigarettes, chewing tobacco, or electronic cigarettes. If you need help quitting, ask your health care provider.  Do not drink alcohol.  Take medicines only as directed by your health care provider.  Keep all follow-up visits as directed by your health care provider. This is important. This includes any further testing if your chest pain does not go away.  If heartburn is the cause for your chest pain, you may be told to keep your head raised (elevated) while sleeping. This reduces the chance that acid will go from your stomach into your esophagus.  Make lifestyle changes as directed by your health care provider. These may include:  Getting regular exercise. Ask your health care provider to suggest some activities that are safe for you.  Eating a heart-healthy diet. A registered dietitian can help you to learn healthy eating options.  Maintaining a healthy weight.  Managing diabetes, if necessary.  Reducing stress. SEEK MEDICAL CARE IF:  Your chest pain does not go away after treatment.  You have a rash with blisters on your chest.  You have a fever. SEEK IMMEDIATE MEDICAL CARE IF:   Your chest pain is worse.  You have an increasing cough, or you cough up blood.  You have severe abdominal pain.  You have severe weakness.  You faint.  You have chills.  You have sudden, unexplained chest discomfort.  You have sudden, unexplained discomfort in your arms, back, neck, or jaw.  You have shortness of breath at any time.  You suddenly start to sweat, or your skin gets clammy.  You feel nauseous or you vomit.  You suddenly feel light-headed or dizzy.  Your heart begins to beat quickly, or it feels like it is skipping beats. These symptoms may represent a serious problem that is an emergency. Do not wait to  see if the symptoms will go away. Get medical help right away. Call your local emergency services (911 in the [place] Do not drive yourself to the hospital.   This information is not intended to replace advice given to you by your health care provider. Make sure you discuss any questions you have with your health care provider.   Document Released: 08/13/2005 Document Revised: 11/24/2014 Document Reviewed: 06/09/2014 Elsevier Interactive Patient Education 2016 [employer]  Back Pain, Adult Back pain is very common in adults.The cause of back pain is rarely dangerous and the pain often gets better over time.The cause of your back pain may not be known. Some common causes of back pain include:  Strain of the muscles or ligaments supporting the spine.  Wear and tear (degeneration) of the spinal disks.  Arthritis.  Direct injury to the back. For many people, back pain may return. Since back pain is rarely dangerous, most people can learn to manage this condition on their own. HOME CARE INSTRUCTIONS Watch your back pain for any changes. The following actions may help to lessen any discomfort you are feeling:  Remain active. It is stressful on your back to sit or stand in one place for long periods  of time. Do not sit, drive, or stand in one place for more than 30 minutes at a time. Take short walks on even surfaces as soon as you are able.Try to increase the length of time you walk each day.  Exercise regularly as directed by your health care provider. Exercise helps your back heal faster. It also helps avoid future injury by keeping your muscles strong and flexible.  Do not stay in bed.Resting more than 1-2 days can delay your recovery.  Pay attention to your body when you bend and lift. The most comfortable positions are those that put less stress on your recovering back. Always use proper lifting techniques, including:  Bending your knees.  Keeping the load close to your  body.  Avoiding twisting.  Find a comfortable position to sleep. Use a firm mattress and lie on your side with your knees slightly bent. If you lie on your back, put a pillow under your knees.  Avoid feeling anxious or stressed.Stress increases muscle tension and can worsen back pain.It is important to recognize when you are anxious or stressed and learn ways to manage it, such as with exercise.  Take medicines only as directed by your health care provider. Over-the-counter medicines to reduce pain and inflammation are often the most helpful.Your health care provider may prescribe muscle relaxant drugs.These medicines help dull your pain so you can more quickly return to your normal activities and healthy exercise.  Apply ice to the injured area:  Put ice in a plastic bag.  Place a towel between your skin and the bag.  Leave the ice on for 20 minutes, 2-3 times a day for the first 2-3 days. After that, ice and heat may be alternated to reduce pain and spasms.  Maintain a healthy weight. Excess weight puts extra stress on your back and makes it difficult to maintain good posture. SEEK MEDICAL CARE IF:  You have pain that is not relieved with rest or medicine.  You have increasing pain going down into the legs or buttocks.  You have pain that does not improve in one week.  You have night pain.  You lose weight.  You have a fever or chills. SEEK IMMEDIATE MEDICAL CARE IF:   You develop new bowel or bladder control problems.  You have unusual weakness or numbness in your arms or legs.  You develop nausea or vomiting.  You develop abdominal pain.  You feel faint.   This information is not intended to replace advice given to you by your health care provider. Make sure you discuss any questions you have with your health care provider.   Document Released: 11/03/2005 Document Revised: 11/24/2014 Document Reviewed: 03/07/2014 Elsevier Interactive Patient Education [employer]

## 2016-03-01 NOTE — ED Notes (Signed)
Patient transported to CT 

## 2016-07-12 ENCOUNTER — Encounter: Payer: Self-pay | Admitting: Emergency Medicine

## 2016-07-12 ENCOUNTER — Emergency Department: Payer: Medicaid Other

## 2016-07-12 ENCOUNTER — Emergency Department
Admission: EM | Admit: 2016-07-12 | Discharge: 2016-07-13 | Disposition: A | Discharge status: Home / Self Care | Payer: Medicaid Other | Attending: Emergency Medicine | Admitting: Emergency Medicine

## 2016-07-12 DIAGNOSIS — E119 Type 2 diabetes mellitus without complications: Secondary | ICD-10-CM | POA: Insufficient documentation

## 2016-07-12 DIAGNOSIS — Z79899 Other long term (current) drug therapy: Secondary | ICD-10-CM | POA: Insufficient documentation

## 2016-07-12 DIAGNOSIS — R079 Chest pain, unspecified: Secondary | ICD-10-CM

## 2016-07-12 DIAGNOSIS — F17210 Nicotine dependence, cigarettes, uncomplicated: Secondary | ICD-10-CM | POA: Insufficient documentation

## 2016-07-12 DIAGNOSIS — I10 Essential (primary) hypertension: Secondary | ICD-10-CM | POA: Diagnosis not present

## 2016-07-12 DIAGNOSIS — R0602 Shortness of breath: Secondary | ICD-10-CM | POA: Insufficient documentation

## 2016-07-12 LAB — CBC
HEMATOCRIT: 32.7 % — AB (ref 35.0–47.0)
HEMOGLOBIN: 11.3 g/dL — AB (ref 12.0–16.0)
MCH: 29.8 pg (ref 26.0–34.0)
MCHC: 34.6 g/dL (ref 32.0–36.0)
MCV: 86.2 fL (ref 80.0–100.0)
Platelets: 263 10*3/uL (ref 150–440)
RBC: 3.8 MIL/uL (ref 3.80–5.20)
RDW: 14 % (ref 11.5–14.5)
WBC: 10.8 10*3/uL (ref 3.6–11.0)

## 2016-07-12 LAB — BASIC METABOLIC PANEL
ANION GAP: 9 (ref 5–15)
BUN: 12 mg/dL (ref 6–20)
CO2: 25 mmol/L (ref 22–32)
Calcium: 9 mg/dL (ref 8.9–10.3)
Chloride: 101 mmol/L (ref 101–111)
Creatinine, Ser: 0.47 mg/dL (ref 0.44–1.00)
GFR calc Af Amer: >60 mL/min (ref >60)
GLUCOSE: 139 mg/dL — AB (ref 65–99)
POTASSIUM: 3.7 mmol/L (ref 3.5–5.1)
SODIUM: 135 mmol/L (ref 135–145)

## 2016-07-12 LAB — TROPONIN I: Troponin I: <0.03 ng/mL (ref <0.03)

## 2016-07-12 MED ORDER — ASPIRIN 81 MG PO CHEW
324.0000 mg | CHEWABLE_TABLET | Freq: Once | ORAL | Status: AC
  Administered 2016-07-13: 324 mg via ORAL
  Filled 2016-07-12: qty 4

## 2016-07-12 NOTE — ED Provider Notes (Signed)
[facility] Emergency Department Provider Note  ____________________________________________   First MD Initiated Contact with Patient 07/12/16 2323     (approximate)  I have reviewed the triage vital signs and the nursing notes.   HISTORY  Chief Complaint Chest Pain    HPI Gabrielle Sampson is a 45 y.o. female who presents for acute onset central and right-sided sharp stabbing chest pain that she describes as mild and comes and goes over the last 3 hours.  Nothing particular made it starts and nothing seems to make it better or worse, it simply resolves on its own.  It scares her because of his chest pain but it is not particularly severe.  When it is occurring she feels a slight shortness of breath as well.  She denies fever/chills, nausea, vomiting, abdominal pain, dysuria.  She has had similar symptoms in the past and in fact had a cardiac catheterization about 5 years ago at the age of 35 as a result of similar chest pain, but that her catheterization was clean and no specific cause was identified.  In the recent past she has also had outpatient ultrasounds of her legs to rule out DVT because she has persistent and chronic bilateral lower extremity pain, but the ultrasounds were negative as well.  She has no history of diagnosed PE nor DVT.  She is morbidly obese and her medical history includes hypertension, diabetes, frequent episodes of epigastric abdominal pain, anxiety, and depression.   Past Medical History:  Diagnosis Date  . Anxiety   . Diabetes ([place])   . Fatigue   . HBP (high blood pressure)   . Morbid obesity ([place])   . Neuropathy ([place])   . Osteoarthritis   . Pancreatitis   . Reflux     Patient Active Problem List   Diagnosis Date Noted  . Dysphagia 09/30/2015  . Benign essential HTN 09/30/2015  . Diabetes ([place]) 09/30/2015  . Obesity 09/30/2015  . Angioedema 09/28/2015  . ABDOMINAL PAIN, EPIGASTRIC 06/18/2007  . PANCREATITIS  05/10/2007  . URTICARIA 05/10/2007  . ANXIETY 04/29/2007  . DEPRESSION 04/29/2007  . HYPERTENSION 04/29/2007  . DERMATOFIBROMA 02/22/2007  . IRRITABLE BOWEL SYNDROME 02/22/2007  . SKIN TAG 02/22/2007  . SLEEP DISORDER 02/22/2007    Past Surgical History:  Procedure Laterality Date  . ABSCESS DRAINAGE     MRSA from abdominal wound  . ANAL FISSURE REPAIR    . CARDIAC CATHETERIZATION    . [place] AND 1999  . ESOPHAGOGASTRODUODENOSCOPY (EGD) WITH PROPOFOL N/A 10/25/2015   Procedure: ESOPHAGOGASTRODUODENOSCOPY (EGD) WITH PROPOFOL;  Surgeon: Manya Silvas, MD;  Location: [facility] ENDOSCOPY;  Service: Endoscopy;  Laterality: N/A;  . [place]  . KNEE SURGERY Right 2012  . TONSILLECTOMY AND ADENOIDECTOMY    . TUBAL LIGATION      Prior to Admission medications   Medication Sig Start Date End Date Taking? Authorizing Provider  ALPRAZolam Duanne Moron) 0.5 MG tablet Take 0.5 mg by mouth 2 (two) times daily as needed for anxiety or sleep.    Yes Historical Provider, MD  ARIPiprazole (ABILIFY) 2 MG tablet Take 2 mg by mouth daily.   Yes Historical Provider, MD  citalopram (CELEXA) 40 MG tablet Take 40 mg by mouth daily.   Yes Historical Provider, MD  ferrous gluconate (FERGON) 324 MG tablet Take 324 mg by mouth daily with breakfast.   Yes Historical Provider, MD  gabapentin (NEURONTIN) 400 MG capsule Take 400 mg by mouth See  admin instructions. 2 capsules every morning, 1 capsule every lunch, and 2 capsules at bedtime   Yes Historical Provider, MD  hydrochlorothiazide (HYDRODIURIL) 25 MG tablet Take 25 mg by mouth daily.   Yes Historical Provider, MD  LANTUS SOLOSTAR 100 UNIT/ML Solostar Pen Inject 5 Units into the skin at bedtime.   Yes Historical Provider, MD  metoprolol (LOPRESSOR) 50 MG tablet Take 50 mg by mouth 2 (two) times daily.   Yes Historical Provider, MD  norgestimate-ethinyl estradiol (ORTHO-CYCLEN,SPRINTEC,PREVIFEM) 0.25-35 MG-MCG tablet Take 1 tablet by  mouth daily.   Yes Historical Provider, MD  nystatin (MYCOSTATIN/NYSTOP) 100000 UNIT/GM POWD Apply 100,000 Units topically 2 (two) times daily.   Yes Historical Provider, MD  omeprazole (PRILOSEC) 40 MG capsule Take 40 mg by mouth daily.   Yes Historical Provider, MD    Allergies Sulfa antibiotics; Betadine [povidone iodine]; Codeine phosphate; Iodine; Lisinopril; and Morphine and related  Family History  Problem Relation Age of Onset  . Hypothyroidism Mother   . Melanoma Mother   . CAD Father     Social History Social History  Substance Use Topics  . Smoking status: Current Every Day Smoker    Packs/day: 0.50    Types: Cigarettes    Last attempt to quit: 08/08/2013  . Smokeless tobacco: Never Used  . Alcohol use No    Review of Systems Constitutional: No fever/chills Eyes: No visual changes. ENT: No sore throat. Cardiovascular: +chest pain. Respiratory: +shortness of breath. Gastrointestinal: No abdominal pain.  No nausea, no vomiting.  No diarrhea.  No constipation. Genitourinary: Negative for dysuria. Musculoskeletal: Negative for back pain.  Chronic bilateral leg pain. Skin: Negative for rash. Neurological: Negative for headaches, focal weakness or numbness.  10-point ROS otherwise negative.  ____________________________________________   PHYSICAL EXAM:  VITAL SIGNS: ED Triage Vitals  Enc Vitals Group     BP 07/12/16 2103 (!) 147/80     Pulse Rate 07/12/16 2103 80     Resp 07/12/16 2103 20     Temp 07/12/16 2103 97.9 F (36.6 C)     Temp Source 07/12/16 2103 Oral     SpO2 07/12/16 2103 99 %     Weight 07/12/16 2106 (!) 330 lb (149.7 kg)     Height 07/12/16 2106 5\' 4"  (1.626 m)     Head Circumference --      Peak Flow --      Pain Score 07/12/16 2106 6     Pain Loc --      Pain Edu? --      Excl. in [place]? --     Constitutional: Alert and oriented. Well appearing and in no acute distress. Eyes: Conjunctivae are normal. PERRL. EOMI. Head:  Atraumatic. Nose: No congestion/rhinnorhea. Mouth/Throat: Mucous membranes are moist.  Oropharynx non-erythematous. Neck: No stridor.  No meningeal signs.   Cardiovascular: Normal rate, regular rhythm. Good peripheral circulation. Grossly normal heart sounds. Respiratory: Normal respiratory effort.  No retractions. Lungs CTAB. Gastrointestinal: Morbid obesity. Soft and nontender. No distention.  Musculoskeletal: No lower extremity tenderness nor edema. No gross deformities of extremities. Neurologic:  Normal speech and language. No gross focal neurologic deficits are appreciated.  Skin:  Skin is warm, dry and intact. No rash noted. Psychiatric: Mood and affect are anxious. Speech and behavior are normal.  ____________________________________________   LABS (all labs ordered are listed, but only abnormal results are displayed)  Labs Reviewed  BASIC METABOLIC PANEL - Abnormal; Notable for the following:       Result Value  Glucose, Bld 139 (*)    All other components within normal limits  CBC - Abnormal; Notable for the following:    Hemoglobin 11.3 (*)    HCT 32.7 (*)    All other components within normal limits  TROPONIN I  FIBRIN DERIVATIVES D-DIMER ([facility] ONLY)  TROPONIN I   ____________________________________________  EKG  ED ECG REPORT I, Alliah Boulanger, the attending physician, personally viewed and interpreted this ECG.  Date: 07/12/2016 EKG Time: 21:01 Rate: 80 Rhythm: normal sinus rhythm QRS Axis: normal Intervals: normal ST/T Wave abnormalities: normal Conduction Disturbances: none Narrative Interpretation: unremarkable  ____________________________________________  RADIOLOGY   Dg Chest 2 View  Result Date: 07/12/2016 CLINICAL DATA:  Sternal chest pain one hour prior. Shortness of breath. EXAM: CHEST  2 VIEW COMPARISON:  Radiographs and CT 02/29/2016 FINDINGS: The cardiomediastinal contours are unchanged. The lungs are clear. Pulmonary vasculature is  normal. No consolidation, pleural effusion, or pneumothorax. No acute osseous abnormalities are seen. IMPRESSION: No active cardiopulmonary disease. Electronically Signed   By: Jeb Levering M.D.   On: 07/12/2016 23:00    ____________________________________________   PROCEDURES  Procedure(s) performed:   Procedures   Critical Care performed: No ____________________________________________   INITIAL IMPRESSION / ASSESSMENT AND PLAN / ED COURSE  Pertinent labs & imaging results that were available during my care of the patient were reviewed by me and considered in my medical decision making (see chart for details).  HEART score 3 for risk factors and moderately suspicious history.  Low risk for PE, but I do not have a better explanation, so this is a patient for whom a d-dimer would be usual in ruling out thromboembolism.  I will also check a second troponin to further lower her risk profile, but I anticipate outpatient follow-up with cardiology.  I will give her a full dose aspirin while waiting.   Clinical Course  Comment By Time  D-dimer is within normal limits and second troponin was also negative.  Patient has no pain at this time.I gave my usual and customary return precautions.  Hinda Kehr, MD 08/27 0140    ____________________________________________  FINAL CLINICAL IMPRESSION(S) / ED DIAGNOSES  Final diagnoses:  Chest pain, unspecified chest pain type     MEDICATIONS GIVEN DURING THIS VISIT:  Medications  aspirin chewable tablet 324 mg (324 mg Oral Given 07/13/16 0007)     NEW OUTPATIENT MEDICATIONS STARTED DURING THIS VISIT:  New Prescriptions   No medications on file      Note:  This document was prepared using Dragon voice recognition software and may include unintentional dictation errors.    Hinda Kehr, MD 07/13/16 [phone]

## 2016-07-12 NOTE — ED Triage Notes (Signed)
Pt reports CP started approx 2030, 6/10 sharp left of center with sweats, dizziness and SOB.   Pt hx of cardiac cath about 5 years ago for CP without known cause.

## 2016-07-12 NOTE — ED Notes (Signed)
Pt updated on repeat troponin time. Pt verbalizes understanding. Pt watching tv in no acute distress.

## 2016-07-12 NOTE — ED Notes (Signed)
Pt describes onset of chest pain while at rest with associated shob, lightheadedness. Pt states pain will go away completely then come back and is sharp in nature. Pt denies pain currently. Pt denies pain radiation. Describes pain as central and mid left chest. Pt states has had cough, left ankle with slight non pitting edema noted. Skin pwd, resps unlabored.

## 2016-07-13 LAB — FIBRIN DERIVATIVES D-DIMER (ARMC ONLY): FIBRIN DERIVATIVES D-DIMER (ARMC): 282 (ref 0–499)

## 2016-07-13 LAB — TROPONIN I

## 2016-07-13 NOTE — Discharge Instructions (Signed)

## 2016-07-13 NOTE — ED Notes (Signed)
Pt updated on results progress. Family and visitors provided with warm blankets for comfort per request.

## 2016-07-16 DIAGNOSIS — I8390 Asymptomatic varicose veins of unspecified lower extremity: Secondary | ICD-10-CM | POA: Insufficient documentation

## 2016-12-22 DIAGNOSIS — I10 Essential (primary) hypertension: Secondary | ICD-10-CM | POA: Diagnosis not present

## 2016-12-22 DIAGNOSIS — F17210 Nicotine dependence, cigarettes, uncomplicated: Secondary | ICD-10-CM | POA: Insufficient documentation

## 2016-12-22 DIAGNOSIS — Z79899 Other long term (current) drug therapy: Secondary | ICD-10-CM | POA: Diagnosis not present

## 2016-12-22 DIAGNOSIS — E119 Type 2 diabetes mellitus without complications: Secondary | ICD-10-CM | POA: Insufficient documentation

## 2016-12-22 DIAGNOSIS — R0781 Pleurodynia: Secondary | ICD-10-CM | POA: Diagnosis not present

## 2016-12-22 DIAGNOSIS — R1012 Left upper quadrant pain: Secondary | ICD-10-CM | POA: Diagnosis not present

## 2016-12-22 LAB — COMPREHENSIVE METABOLIC PANEL
ALT: 24 U/L (ref 14–54)
AST: 46 U/L — AB (ref 15–41)
Albumin: 3.6 g/dL (ref 3.5–5.0)
Alkaline Phosphatase: 127 U/L — ABNORMAL HIGH (ref 38–126)
Anion gap: 9 (ref 5–15)
BUN: 13 mg/dL (ref 6–20)
CHLORIDE: 96 mmol/L — AB (ref 101–111)
CO2: 28 mmol/L (ref 22–32)
CREATININE: 0.66 mg/dL (ref 0.44–1.00)
Calcium: 8.9 mg/dL (ref 8.9–10.3)
GFR calc non Af Amer: >60 mL/min (ref >60)
Glucose, Bld: 278 mg/dL — ABNORMAL HIGH (ref 65–99)
POTASSIUM: 3.8 mmol/L (ref 3.5–5.1)
SODIUM: 133 mmol/L — AB (ref 135–145)
Total Bilirubin: 0.5 mg/dL (ref 0.3–1.2)
Total Protein: 8.4 g/dL — ABNORMAL HIGH (ref 6.5–8.1)

## 2016-12-22 LAB — URINALYSIS, COMPLETE (UACMP) WITH MICROSCOPIC
BACTERIA UA: NONE SEEN
Bilirubin Urine: NEGATIVE
GLUCOSE, UA: 50 mg/dL — AB
Hgb urine dipstick: NEGATIVE
KETONES UR: NEGATIVE mg/dL
LEUKOCYTES UA: NEGATIVE
Nitrite: NEGATIVE
PROTEIN: NEGATIVE mg/dL
Specific Gravity, Urine: 1.017 (ref 1.005–1.030)
pH: 6 (ref 5.0–8.0)

## 2016-12-22 LAB — CBC
HCT: 33.8 % — ABNORMAL LOW (ref 35.0–47.0)
Hemoglobin: 11.5 g/dL — ABNORMAL LOW (ref 12.0–16.0)
MCH: 29 pg (ref 26.0–34.0)
MCHC: 34.2 g/dL (ref 32.0–36.0)
MCV: 84.9 fL (ref 80.0–100.0)
PLATELETS: 249 10*3/uL (ref 150–440)
RBC: 3.98 MIL/uL (ref 3.80–5.20)
RDW: 14.7 % — ABNORMAL HIGH (ref 11.5–14.5)
WBC: 13.3 10*3/uL — ABNORMAL HIGH (ref 3.6–11.0)

## 2016-12-22 LAB — LIPASE, BLOOD: LIPASE: 43 U/L (ref 11–51)

## 2016-12-22 NOTE — ED Triage Notes (Signed)
Pt reports that she is having a "pancreatic flair" - c/o left upper abd quad pain - pt reports having pancreatitis 3 years ago and this feels the same - reports nausea - denies vomiting

## 2016-12-23 ENCOUNTER — Emergency Department
Admission: EM | Admit: 2016-12-23 | Discharge: 2016-12-23 | Disposition: A | Discharge status: Home / Self Care | Payer: Medicaid Other | Attending: Emergency Medicine | Admitting: Emergency Medicine

## 2016-12-23 DIAGNOSIS — R0781 Pleurodynia: Secondary | ICD-10-CM

## 2016-12-23 DIAGNOSIS — R1012 Left upper quadrant pain: Secondary | ICD-10-CM

## 2016-12-23 MED ORDER — IBUPROFEN 600 MG PO TABS
600.0000 mg | ORAL_TABLET | Freq: Once | ORAL | Status: AC
  Administered 2016-12-23: 600 mg via ORAL
  Filled 2016-12-23: qty 1

## 2016-12-23 NOTE — ED Provider Notes (Signed)
[facility] Emergency Department Provider Note  ____________________________________________   None    (approximate)  I have reviewed the triage vital signs and the nursing notes.   HISTORY  Chief Complaint Abdominal Pain    HPI Loyalti Nuehring is a 46 y.o. female with a history of chronic chest pain and multiple other chronic medical conditions who presents for evaluation of pain in the left upper part of her abdomen.  She reports that she thinks it is a "pancreatic flare."  She denies nausea and vomiting.  She has had 1 or 2 loose stools recently.  She has no other abdominal pain.  She denies fever/chills, chest pain, shortness of breath.Movement makes the pain worse and nothing in particular makes it better.  She is status post cholecystectomy.  She describes it is severe and sharp and stabbing.   Past Medical History:  Diagnosis Date  . Anxiety   . Diabetes ([place])   . Fatigue   . HBP (high blood pressure)   . Morbid obesity ([place])   . Neuropathy ([place])   . Osteoarthritis   . Pancreatitis   . Reflux     Patient Active Problem List   Diagnosis Date Noted  . Dysphagia 09/30/2015  . Benign essential HTN 09/30/2015  . Diabetes ([place]) 09/30/2015  . Obesity 09/30/2015  . Angioedema 09/28/2015  . ABDOMINAL PAIN, EPIGASTRIC 06/18/2007  . PANCREATITIS 05/10/2007  . URTICARIA 05/10/2007  . ANXIETY 04/29/2007  . DEPRESSION 04/29/2007  . HYPERTENSION 04/29/2007  . DERMATOFIBROMA 02/22/2007  . IRRITABLE BOWEL SYNDROME 02/22/2007  . SKIN TAG 02/22/2007  . SLEEP DISORDER 02/22/2007    Past Surgical History:  Procedure Laterality Date  . ABSCESS DRAINAGE     MRSA from abdominal wound  . ANAL FISSURE REPAIR    . CARDIAC CATHETERIZATION    . [place] AND 1999  . ESOPHAGOGASTRODUODENOSCOPY (EGD) WITH PROPOFOL N/A 10/25/2015   Procedure: ESOPHAGOGASTRODUODENOSCOPY (EGD) WITH PROPOFOL;  Surgeon: Manya Silvas, MD;  Location:  [facility] ENDOSCOPY;  Service: Endoscopy;  Laterality: N/A;  . [place]  . KNEE SURGERY Right 2012  . TONSILLECTOMY AND ADENOIDECTOMY    . TUBAL LIGATION      Prior to Admission medications   Medication Sig Start Date End Date Taking? Authorizing Provider  ALPRAZolam Duanne Moron) 0.5 MG tablet Take 0.5 mg by mouth 2 (two) times daily as needed for anxiety or sleep.     Historical Provider, MD  ARIPiprazole (ABILIFY) 2 MG tablet Take 2 mg by mouth daily.    Historical Provider, MD  citalopram (CELEXA) 40 MG tablet Take 40 mg by mouth daily.    Historical Provider, MD  ferrous gluconate (FERGON) 324 MG tablet Take 324 mg by mouth daily with breakfast.    Historical Provider, MD  gabapentin (NEURONTIN) 400 MG capsule Take 400 mg by mouth See admin instructions. 2 capsules every morning, 1 capsule every lunch, and 2 capsules at bedtime    Historical Provider, MD  hydrochlorothiazide (HYDRODIURIL) 25 MG tablet Take 25 mg by mouth daily.    Historical Provider, MD  LANTUS SOLOSTAR 100 UNIT/ML Solostar Pen Inject 5 Units into the skin at bedtime.    Historical Provider, MD  metoprolol (LOPRESSOR) 50 MG tablet Take 50 mg by mouth 2 (two) times daily.    Historical Provider, MD  norgestimate-ethinyl estradiol (ORTHO-CYCLEN,SPRINTEC,PREVIFEM) 0.25-35 MG-MCG tablet Take 1 tablet by mouth daily.    Historical Provider, MD  nystatin (MYCOSTATIN/NYSTOP) 100000 UNIT/GM POWD  Apply 100,000 Units topically 2 (two) times daily.    Historical Provider, MD  omeprazole (PRILOSEC) 40 MG capsule Take 40 mg by mouth daily.    Historical Provider, MD    Allergies Sulfa antibiotics; Betadine [povidone iodine]; Codeine phosphate; Iodine; Lisinopril; and Morphine and related  Family History  Problem Relation Age of Onset  . Hypothyroidism Mother   . Melanoma Mother   . CAD Father     Social History Social History  Substance Use Topics  . Smoking status: Current Every Day Smoker    Packs/day: 0.50     Types: Cigarettes    Last attempt to quit: 08/08/2013  . Smokeless tobacco: Never Used  . Alcohol use No    Review of Systems Constitutional: No fever/chills Eyes: No visual changes. ENT: No sore throat. Cardiovascular: Denies chest pain. Respiratory: Denies shortness of breath. Gastrointestinal: Left upper abdominal pain.  No nausea, no vomiting.  One to 2 loose stools.   Genitourinary: Negative for dysuria. Musculoskeletal: Negative for back pain. Skin: Negative for rash. Neurological: Negative for headaches, focal weakness or numbness.  10-point ROS otherwise negative.  ____________________________________________   PHYSICAL EXAM:  VITAL SIGNS: ED Triage Vitals  Enc Vitals Group     BP 12/22/16 2247 (!) 155/94     Pulse Rate 12/22/16 2247 92     Resp 12/22/16 2247 16     Temp 12/22/16 2247 98.2 F (36.8 C)     Temp Source 12/22/16 2247 Oral     SpO2 12/22/16 2247 99 %     Weight 12/22/16 2247 (!) 322 lb (146.1 kg)     Height 12/22/16 2247 5\' 4"  (1.626 m)     Head Circumference --      Peak Flow --      Pain Score 12/22/16 2248 7     Pain Loc --      Pain Edu? --      Excl. in [place]? --     Constitutional: Alert and oriented. Well appearing and in no acute distress Although she does appear uncomfortable Eyes: Conjunctivae are normal. PERRL. EOMI. Head: Atraumatic. Nose: No congestion/rhinnorhea. Mouth/Throat: Mucous membranes are moist.  Oropharynx non-erythematous. Neck: No stridor.  No meningeal signs.   Cardiovascular: Normal rate, regular rhythm. Good peripheral circulation. Grossly normal heart sounds.  Severe tenderness to palpation of her left lower rib cage. Respiratory: Normal respiratory effort.  No retractions. Lungs CTAB. Gastrointestinal: Morbid obesity.  Soft and nontender. No distention.  No palpable splenomegaly although exam is limited by body habitus Musculoskeletal: No lower extremity tenderness nor edema. No gross deformities of  extremities. Neurologic:  Normal speech and language. No gross focal neurologic deficits are appreciated.  Skin:  Skin is warm, dry and intact. No rash noted.   ____________________________________________   LABS (all labs ordered are listed, but only abnormal results are displayed)  Labs Reviewed  COMPREHENSIVE METABOLIC PANEL - Abnormal; Notable for the following:       Result Value   Sodium 133 (*)    Chloride 96 (*)    Glucose, Bld 278 (*)    Total Protein 8.4 (*)    AST 46 (*)    Alkaline Phosphatase 127 (*)    All other components within normal limits  CBC - Abnormal; Notable for the following:    WBC 13.3 (*)    Hemoglobin 11.5 (*)    HCT 33.8 (*)    RDW 14.7 (*)    All other components within normal limits  URINALYSIS, COMPLETE (UACMP) WITH MICROSCOPIC - Abnormal; Notable for the following:    Color, Urine YELLOW (*)    APPearance CLEAR (*)    Glucose, UA 50 (*)    Squamous Epithelial / LPF 0-5 (*)    All other components within normal limits  LIPASE, BLOOD   ____________________________________________  EKG  None - EKG not ordered by ED physician ____________________________________________  RADIOLOGY   No results found.  ____________________________________________   PROCEDURES  Procedure(s) performed:   Procedures   Critical Care performed: No ____________________________________________   INITIAL IMPRESSION / ASSESSMENT AND PLAN / ED COURSE  Pertinent labs & imaging results that were available during my care of the patient were reviewed by me and considered in my medical decision making (see chart for details).  The patient is generally well-appearing with normal vital signs.  She has no abdominal tenderness to palpation but she is highly tender to palpation of the left lower rib cage which is the area where she was feeling abdominal pain.  She has had no history of trauma.  I believe that she has musculoskeletal pain in the left lower  ribs, possibly costochondritis.  I explained this to her and explained her reassuring lab work except for a very mild leukocytosis.  She has no infectious signs or symptoms at this time and I encouraged the use of NSAIDs and Tylenol and close outpatient follow-up.  She understands and agrees with the plan.      ____________________________________________  FINAL CLINICAL IMPRESSION(S) / ED DIAGNOSES  Final diagnoses:  Rib pain on left side  LUQ abdominal pain     MEDICATIONS GIVEN DURING THIS VISIT:  Medications  ibuprofen (ADVIL,MOTRIN) tablet 600 mg (not administered)     NEW OUTPATIENT MEDICATIONS STARTED DURING THIS VISIT:  New Prescriptions   No medications on file    Modified Medications   No medications on file    Discontinued Medications   No medications on file     Note:  This document was prepared using Dragon voice recognition software and may include unintentional dictation errors.    Hinda Kehr, MD 12/23/16 [phone]

## 2016-12-23 NOTE — Discharge Instructions (Signed)
You have been seen in the Emergency Department (ED) for abdominal pain.  Your evaluation did not identify a clear cause of your symptoms but was generally reassuring.  Based on your physical exam, it is actually the left lower part of your rib cage that is hurting, not your abdomen.  We encourage you to use Tylenol (up to 1000 mg 4 times a day) and ibuprofen (600 mg 3 times a day with meals) and we anticipate your pain should improve.  Please follow up as instructed above regarding today?s emergent visit and the symptoms that are bothering you.  Return to the ED if your abdominal pain worsens or fails to improve, you develop bloody vomiting, bloody diarrhea, you are unable to tolerate fluids due to vomiting, fever greater than 101, or other symptoms that concern you.

## 2017-04-27 ENCOUNTER — Emergency Department
Admission: EM | Admit: 2017-04-27 | Discharge: 2017-04-27 | Disposition: A | Discharge status: Home / Self Care | Payer: Self-pay | Attending: Emergency Medicine | Admitting: Emergency Medicine

## 2017-04-27 ENCOUNTER — Encounter: Payer: Self-pay | Admitting: Emergency Medicine

## 2017-04-27 ENCOUNTER — Emergency Department: Payer: Self-pay

## 2017-04-27 DIAGNOSIS — I10 Essential (primary) hypertension: Secondary | ICD-10-CM | POA: Insufficient documentation

## 2017-04-27 DIAGNOSIS — M79604 Pain in right leg: Secondary | ICD-10-CM | POA: Insufficient documentation

## 2017-04-27 DIAGNOSIS — F17210 Nicotine dependence, cigarettes, uncomplicated: Secondary | ICD-10-CM | POA: Insufficient documentation

## 2017-04-27 DIAGNOSIS — E119 Type 2 diabetes mellitus without complications: Secondary | ICD-10-CM | POA: Insufficient documentation

## 2017-04-27 DIAGNOSIS — R2241 Localized swelling, mass and lump, right lower limb: Secondary | ICD-10-CM | POA: Insufficient documentation

## 2017-04-27 DIAGNOSIS — Z79899 Other long term (current) drug therapy: Secondary | ICD-10-CM | POA: Insufficient documentation

## 2017-04-27 DIAGNOSIS — Z794 Long term (current) use of insulin: Secondary | ICD-10-CM | POA: Insufficient documentation

## 2017-04-27 NOTE — ED Triage Notes (Signed)
Pt reports right leg swelling and right calf tenderness since Saturday. Pt reports redness and warmth to right thigh.

## 2017-04-27 NOTE — Discharge Instructions (Signed)
Please seek medical attention for any high fevers, chest pain, shortness of breath, change in behavior, persistent vomiting, bloody stool or any other new or concerning symptoms.  

## 2017-04-27 NOTE — ED Provider Notes (Signed)
[facility] Emergency Department Provider Note   ____________________________________________   I have reviewed the triage vital signs and the nursing notes.   HISTORY  Chief Complaint Leg Pain   History limited by: Not Limited   HPI Gabrielle Sampson is a 46 y.o. female who presents to the emergency department today because of concern for right leg pain and swelling. The patient states that it started 2 days ago. It is located in her mid calf on the mid right calf. The patient states she has had some associated warmth and redness to that area. The patient called her primary care doctor who advised her to present to the emergency department. She has not had any trauma to the leg. No fevers. No shortness of breath or chest pain. 3  Past Medical History:  Diagnosis Date  . Anxiety   . Diabetes ([place])   . Fatigue   . HBP (high blood pressure)   . Morbid obesity ([place])   . Neuropathy   . Osteoarthritis   . Pancreatitis   . Reflux     Patient Active Problem List   Diagnosis Date Noted  . Dysphagia 09/30/2015  . Benign essential HTN 09/30/2015  . Diabetes ([place]) 09/30/2015  . Obesity 09/30/2015  . Angioedema 09/28/2015  . ABDOMINAL PAIN, EPIGASTRIC 06/18/2007  . PANCREATITIS 05/10/2007  . URTICARIA 05/10/2007  . ANXIETY 04/29/2007  . DEPRESSION 04/29/2007  . HYPERTENSION 04/29/2007  . DERMATOFIBROMA 02/22/2007  . IRRITABLE BOWEL SYNDROME 02/22/2007  . SKIN TAG 02/22/2007  . SLEEP DISORDER 02/22/2007    Past Surgical History:  Procedure Laterality Date  . ABSCESS DRAINAGE     MRSA from abdominal wound  . ANAL FISSURE REPAIR    . CARDIAC CATHETERIZATION    . [place] AND 1999  . ESOPHAGOGASTRODUODENOSCOPY (EGD) WITH PROPOFOL N/A 10/25/2015   Procedure: ESOPHAGOGASTRODUODENOSCOPY (EGD) WITH PROPOFOL;  Surgeon: Manya Silvas, MD;  Location: [facility] ENDOSCOPY;  Service: Endoscopy;  Laterality: N/A;  . [place]   . KNEE SURGERY Right 2012  . TONSILLECTOMY AND ADENOIDECTOMY    . TUBAL LIGATION      Prior to Admission medications   Medication Sig Start Date End Date Taking? Authorizing Provider  ALPRAZolam Duanne Moron) 0.5 MG tablet Take 0.5 mg by mouth 2 (two) times daily as needed for anxiety or sleep.     [provider]  ARIPiprazole (ABILIFY) 2 MG tablet Take 2 mg by mouth daily.    [provider]  citalopram (CELEXA) 40 MG tablet Take 40 mg by mouth daily.    [provider]  ferrous gluconate (FERGON) 324 MG tablet Take 324 mg by mouth daily with breakfast.    [provider]  gabapentin (NEURONTIN) 400 MG capsule Take 400 mg by mouth See admin instructions. 2 capsules every morning, 1 capsule every lunch, and 2 capsules at bedtime    [provider]  hydrochlorothiazide (HYDRODIURIL) 25 MG tablet Take 25 mg by mouth daily.    [provider]  LANTUS SOLOSTAR 100 UNIT/ML Solostar Pen Inject 5 Units into the skin at bedtime.    [provider]  metoprolol (LOPRESSOR) 50 MG tablet Take 50 mg by mouth 2 (two) times daily.    [provider]  norgestimate-ethinyl estradiol (ORTHO-CYCLEN,SPRINTEC,PREVIFEM) 0.25-35 MG-MCG tablet Take 1 tablet by mouth daily.    [provider]  nystatin (MYCOSTATIN/NYSTOP) 100000 UNIT/GM POWD Apply 100,000 Units topically 2 (two) times daily.    [provider]  omeprazole (PRILOSEC) 40 MG capsule Take 40 mg by mouth daily.    [provider]    Allergies Sulfa antibiotics; Betadine [povidone iodine]; Codeine phosphate; Iodine; Lisinopril; and Morphine and related  Family History  Problem Relation Age of Onset  . Hypothyroidism Mother   . Melanoma Mother   . CAD Father     Social History Social History  Substance Use Topics  . Smoking status: Current Every Day Smoker    Packs/day: 0.50    Types: Cigarettes    Last attempt to quit: 08/08/2013  . Smokeless  tobacco: Never Used  . Alcohol use No    Review of Systems Constitutional: No fever/chills Eyes: No visual changes. ENT: No sore throat. Cardiovascular: Denies chest pain. Respiratory: Denies shortness of breath. Gastrointestinal: No abdominal pain.  No nausea, no vomiting.  No diarrhea.   Genitourinary: Negative for dysuria. Musculoskeletal: Negative for back pain. Skin: Negative for rash. Neurological: Negative for headaches, focal weakness or numbness.  ____________________________________________   PHYSICAL EXAM:  VITAL SIGNS: ED Triage Vitals  Enc Vitals Group     BP 04/27/17 1612 133/79     Pulse Rate 04/27/17 1612 92     Resp 04/27/17 1612 18     Temp 04/27/17 1612 98.8 F (37.1 C)     Temp Source 04/27/17 1612 Oral     SpO2 04/27/17 1612 97 %     Weight 04/27/17 1610 (!) 320 lb (145.2 kg)     Height 04/27/17 1610 5\' 4"  (1.626 m)     Head Circumference --      Peak Flow --      Pain Score 04/27/17 1605 6     Pain Loc --      Pain Edu? --      Excl. in [place]? --      Constitutional: Alert and oriented. Well appearing and in no distress. Eyes: Conjunctivae are normal.  ENT   Head: Normocephalic and atraumatic.   Nose: No congestion/rhinnorhea.   Mouth/Throat: Mucous membranes are moist.   Neck: No stridor. Hematological/Lymphatic/Immunilogical: No cervical lymphadenopathy. Cardiovascular: Normal rate, regular rhythm.  No murmurs, rubs, or gallops.  Respiratory: Normal respiratory effort without tachypnea nor retractions. Breath sounds are clear and equal bilaterally. No wheezes/rales/rhonchi. Gastrointestinal: Soft and non tender. No rebound. No guarding.  Genitourinary: Deferred Musculoskeletal: Normal range of motion in all extremities. No erythema or warmth over the area identified by the patient as the area of concern. Small roughly 3 cm diameter area of some swelling. Non tender.  Neurologic:  Normal speech and language. No gross focal  neurologic deficits are appreciated.  Skin:  Skin is warm, dry and intact. No rash noted. Psychiatric: Mood and affect are normal. Speech and behavior are normal. Patient exhibits appropriate insight and judgment.  ____________________________________________    LABS (pertinent positives/negatives)  None  ____________________________________________   EKG  None  ____________________________________________    RADIOLOGY  [place] R LE IMPRESSION:  No evidence of DVT within the right lower extremity.   ____________________________________________   PROCEDURES  Procedures  ____________________________________________   INITIAL IMPRESSION / ASSESSMENT AND PLAN / ED COURSE  Pertinent labs & imaging results that were available during my care of the patient were reviewed by me and considered in my medical decision making (see chart for details).  Patient presented to the emergency department today because of concern for right leg pain and swelling. [place] was performed and did not show evidence of blood clot. Physical exam without  any signs or symptoms concerning for infection. At this point think patient is safe for discharge. Will have patient follow up with primary care.   ____________________________________________   FINAL CLINICAL IMPRESSION(S) / ED DIAGNOSES  Final diagnoses:  Right leg pain     Note: This dictation was prepared with Dragon dictation. Any transcriptional errors that result from this process are unintentional     Nance Pear, MD 04/27/17 1844

## 2017-08-03 ENCOUNTER — Encounter: Payer: Self-pay | Admitting: Emergency Medicine

## 2017-08-03 ENCOUNTER — Emergency Department
Admission: EM | Admit: 2017-08-03 | Discharge: 2017-08-04 | Disposition: A | Discharge status: Home / Self Care | Payer: Self-pay | Attending: Emergency Medicine | Admitting: Emergency Medicine

## 2017-08-03 DIAGNOSIS — I10 Essential (primary) hypertension: Secondary | ICD-10-CM | POA: Insufficient documentation

## 2017-08-03 DIAGNOSIS — E119 Type 2 diabetes mellitus without complications: Secondary | ICD-10-CM | POA: Insufficient documentation

## 2017-08-03 DIAGNOSIS — T7840XA Allergy, unspecified, initial encounter: Secondary | ICD-10-CM | POA: Insufficient documentation

## 2017-08-03 DIAGNOSIS — Z794 Long term (current) use of insulin: Secondary | ICD-10-CM | POA: Insufficient documentation

## 2017-08-03 DIAGNOSIS — F17210 Nicotine dependence, cigarettes, uncomplicated: Secondary | ICD-10-CM | POA: Insufficient documentation

## 2017-08-03 DIAGNOSIS — Z79899 Other long term (current) drug therapy: Secondary | ICD-10-CM | POA: Insufficient documentation

## 2017-08-03 MED ORDER — PREDNISONE 20 MG PO TABS
60.0000 mg | ORAL_TABLET | Freq: Once | ORAL | Status: AC
  Administered 2017-08-03: 60 mg via ORAL
  Filled 2017-08-03: qty 3

## 2017-08-03 MED ORDER — EPINEPHRINE PF 1 MG/10ML IJ SOSY
0.3000 mg | PREFILLED_SYRINGE | Freq: Once | INTRAMUSCULAR | Status: DC
  Filled 2017-08-03: qty 10

## 2017-08-03 MED ORDER — METHYLPREDNISOLONE SODIUM SUCC 125 MG IJ SOLR
125.0000 mg | Freq: Once | INTRAMUSCULAR | Status: AC
  Administered 2017-08-03: 125 mg via INTRAVENOUS
  Filled 2017-08-03: qty 2

## 2017-08-03 MED ORDER — DIPHENHYDRAMINE HCL 50 MG/ML IJ SOLN
50.0000 mg | Freq: Once | INTRAMUSCULAR | Status: AC
  Administered 2017-08-03: 50 mg via INTRAVENOUS
  Filled 2017-08-03: qty 1

## 2017-08-03 MED ORDER — FAMOTIDINE IN NACL 20-0.9 MG/50ML-% IV SOLN
20.0000 mg | Freq: Once | INTRAVENOUS | Status: AC
  Administered 2017-08-03: 20 mg via INTRAVENOUS
  Filled 2017-08-03: qty 50

## 2017-08-03 MED ORDER — DIPHENHYDRAMINE HCL 50 MG/ML IJ SOLN
50.0000 mg | Freq: Once | INTRAMUSCULAR | Status: AC
Start: 2017-08-03 — End: 2017-08-03
  Administered 2017-08-03: 50 mg via INTRAVENOUS
  Filled 2017-08-03: qty 1

## 2017-08-03 MED ORDER — EPINEPHRINE PF 1 MG/ML IJ SOLN
INTRAMUSCULAR | Status: AC
Start: 2017-08-03 — End: 2017-08-03
  Administered 2017-08-03: 0.3 mg via SUBCUTANEOUS
  Filled 2017-08-03: qty 1

## 2017-08-03 MED ORDER — EPINEPHRINE PF 1 MG/ML IJ SOLN
0.3000 mg | Freq: Once | INTRAMUSCULAR | Status: AC
  Administered 2017-08-03: 0.3 mg via SUBCUTANEOUS

## 2017-08-03 MED ORDER — PREDNISONE 20 MG PO TABS: 20.0000 mg | ORAL_TABLET | Freq: Every day | ORAL | 0 refills | Status: DC

## 2017-08-03 NOTE — ED Triage Notes (Signed)
Patient to ER for c/o allergic reaction after eating shrimp. Denies previous reaction to shrimp. Reports swelling to throat and itching.

## 2017-08-03 NOTE — ED Provider Notes (Signed)
[facility] Emergency Department Provider Note   ____________________________________________   First MD Initiated Contact with Patient 08/03/17 1926     (approximate)  I have reviewed the triage vital signs and the nursing notes.   HISTORY  Chief Complaint Allergic Reaction    HPI Gabrielle Sampson is a 46 y.o. female  Who says she was eating shrimp and began to get itchy especially in her throat. She thought she might have trouble Breathing. And came into the emergency room. In the emergency room she had a normal voice, no stridor, clear lungs, and some splotchy redness on her s Skin.her voice seems to be a little hoarse later.I will give her more Benadryl and is a diabetic hypertens   Past Medical History:  Diagnosis Date  . Anxiety   . Diabetes ([place])   . Fatigue   . HBP (high blood pressure)   . Morbid obesity ([place])   . Neuropathy   . Osteoarthritis   . Pancreatitis   . Reflux     Patient Active Problem List   Diagnosis Date Noted  . Dysphagia 09/30/2015  . Benign essential HTN 09/30/2015  . Diabetes ([place]) 09/30/2015  . Obesity 09/30/2015  . Angioedema 09/28/2015  . ABDOMINAL PAIN, EPIGASTRIC 06/18/2007  . PANCREATITIS 05/10/2007  . URTICARIA 05/10/2007  . ANXIETY 04/29/2007  . DEPRESSION 04/29/2007  . HYPERTENSION 04/29/2007  . DERMATOFIBROMA 02/22/2007  . IRRITABLE BOWEL SYNDROME 02/22/2007  . SKIN TAG 02/22/2007  . SLEEP DISORDER 02/22/2007    Past Surgical History:  Procedure Laterality Date  . ABSCESS DRAINAGE     MRSA from abdominal wound  . ANAL FISSURE REPAIR    . CARDIAC CATHETERIZATION    . [place] AND 1999  . ESOPHAGOGASTRODUODENOSCOPY (EGD) WITH PROPOFOL N/A 10/25/2015   Procedure: ESOPHAGOGASTRODUODENOSCOPY (EGD) WITH PROPOFOL;  Surgeon: Manya Silvas, MD;  Location: [facility] ENDOSCOPY;  Service: Endoscopy;  Laterality: N/A;  . [place]  . KNEE SURGERY Right 2012  .  TONSILLECTOMY AND ADENOIDECTOMY    . TUBAL LIGATION      Prior to Admission medications   Medication Sig Start Date End Date Taking? Authorizing Provider  ALPRAZolam Duanne Moron) 0.5 MG tablet Take 0.5 mg by mouth 2 (two) times daily as needed for anxiety or sleep.     [provider]  ARIPiprazole (ABILIFY) 2 MG tablet Take 2 mg by mouth daily.    [provider]  citalopram (CELEXA) 40 MG tablet Take 40 mg by mouth daily.    [provider]  ferrous gluconate (FERGON) 324 MG tablet Take 324 mg by mouth daily with breakfast.    [provider]  gabapentin (NEURONTIN) 400 MG capsule Take 400 mg by mouth See admin instructions. 2 capsules every morning, 1 capsule every lunch, and 2 capsules at bedtime    [provider]  hydrochlorothiazide (HYDRODIURIL) 25 MG tablet Take 25 mg by mouth daily.    [provider]  LANTUS SOLOSTAR 100 UNIT/ML Solostar Pen Inject 5 Units into the skin at bedtime.    [provider]  metoprolol (LOPRESSOR) 50 MG tablet Take 50 mg by mouth 2 (two) times daily.    [provider]  norgestimate-ethinyl estradiol (ORTHO-CYCLEN,SPRINTEC,PREVIFEM) 0.25-35 MG-MCG tablet Take 1 tablet by mouth daily.    [provider]  nystatin (MYCOSTATIN/NYSTOP) 100000 UNIT/GM POWD Apply 100,000 Units topically 2 (two) times daily.    [provider]  omeprazole (PRILOSEC) 40 MG capsule Take  40 mg by mouth daily.    [provider]  predniSONE (DELTASONE) 20 MG tablet Take 1 tablet (20 mg total) by mouth daily with breakfast. 08/03/17   Nena Polio, MD    Allergies Sulfa antibiotics; Betadine [povidone iodine]; Codeine phosphate; Iodine; Lisinopril; and Morphine and related  Family History  Problem Relation Age of Onset  . Hypothyroidism Mother   . Melanoma Mother   . CAD Father     Social History Social History  Substance Use Topics  . Smoking status: Current Every Day Smoker     Packs/day: 0.50    Types: Cigarettes    Last attempt to quit: 08/08/2013  . Smokeless tobacco: Never Used  . Alcohol use No    Review of Systems  Constitutional: No fever/chills Eyes: No visual changes. ENT: No sore throat. Cardiovascular: Denies chest pain. Respiratory: Denies shortness of breath. Gastrointestinal: No abdominal pain.  No nausea, no vomiting.  No diarrhea.  No constipation. Genitourinary: Negative for dysuria. Musculoskeletal: Negative for back pain. Skin: Negative for rash. Neurological: Negative for headaches, focal weakness   ____________________________________________   PHYSICAL EXAM:  VITAL SIGNS: ED Triage Vitals  Enc Vitals Group     BP 08/03/17 1908 137/89     Pulse Rate 08/03/17 1908 90     Resp 08/03/17 1908 20     Temp 08/03/17 1908 98.2 F (36.8 C)     Temp Source 08/03/17 1908 Oral     SpO2 08/03/17 1908 98 %     Weight 08/03/17 1906 (!) 302 lb (137 kg)     Height 08/03/17 1906 5\' 4"  (1.626 m)     Head Circumference --      Peak Flow --      Pain Score --      Pain Loc --      Pain Edu? --      Excl. in [place]? --     Constitutional: Alert and oriented. Well appearing and in no acute distress. Eyes: Conjunctivae are normal. Head: Atraumatic. Nose: No congestion/rhinnorhea. Mouth/Throat: Mucous membranes are moist.  Oropharynx non-erythematous. Neck: No stridor.  Cardiovascular: Normal rate, regular rhythm. Grossly normal heart sounds.  Good peripheral circulation. Respiratory: Normal respiratory effort.  No retractions. Lungs CTAB. Gastrointestinal: Soft and nontender. No distention. No abdominal bruits. No CVA tenderness. Musculoskeletal: No lower extremity tenderness nor edema.  No joint effusions. Neurologic:  Normal speech and language. No gross focal neurologic deficits are appreciated. No gait instability. Skin:  Skin is warm, dry and intact. Some red blotches here and there Psychiatric: Mood and affect are normal. Speech and  behavior are normal.  ____________________________________________   LABS (all labs ordered are listed, but only abnormal results are displayed)  Labs Reviewed - No data to display ____________________________________________  EKG   ____________________________________________  RADIOLOGY   ____________________________________________   PROCEDURES  Procedure(s) performed:  Procedures  Critical Care performed:   ____________________________________________   INITIAL IMPRESSION / ASSESSMENT AND PLAN / ED COURSE  Pertinent labs & imaging results that were available during my care of the patient were reviewed by me and considered in my medical decision making (see chart for details).   Signed out to oncoming doctor     ____________________________________________   FINAL CLINICAL IMPRESSION(S) / ED DIAGNOSES  Final diagnoses:  Allergic reaction, initial encounter      NEW MEDICATIONS STARTED DURING THIS VISIT:  New Prescriptions   PREDNISONE (DELTASONE) 20 MG TABLET    Take 1 tablet (20 mg total)  by mouth daily with breakfast.     Note:  This document was prepared using Dragon voice recognition software and may include unintentional dictation errors.    Nena Polio, MD 08/03/17 2131

## 2017-08-03 NOTE — ED Notes (Signed)
Patient ambulatory to restroom with no assistance. Steady gait noted. Patient repositioned in bed and reattached to monitor. Denies increased SOB with exertion.

## 2017-08-03 NOTE — Discharge Instructions (Signed)
Take Benadryl 2 of the over-the-counter pills 4 times a day tomorrow. Today take 1 more dose of 2 of the over-the-counter pills before bed. Take the prednisone one a day starting tomorrow. Return at once for itchy rash or return of other symptoms. If you get short of breath call 911. Follow-up with your regular doctor later this week.

## 2017-08-04 MED ORDER — PREDNISONE 20 MG PO TABS: 40.0000 mg | ORAL_TABLET | Freq: Every day | ORAL | 0 refills | Status: DC

## 2017-08-04 NOTE — ED Provider Notes (Signed)
-----------------------------------------   12:41 AM on 08/04/2017 -----------------------------------------  Patient appears well, denies any symptoms at this time. Denies any feeling or sensation of swelling or edema in her throat. Denies any wheezing or difficulty breathing. Denies any itch or rash. On my exam the patient appears well, no rash present, clear lung sounds bilaterally. We will discharge home with prednisone and PCP follow-up. Patient agreeable to plan.   Harvest Dark, MD 08/04/17 [phone]

## 2018-03-15 LAB — HM HIV SCREENING LAB: HM HIV Screening: NEGATIVE

## 2018-09-22 DIAGNOSIS — N949 Unspecified condition associated with female genital organs and menstrual cycle: Secondary | ICD-10-CM | POA: Insufficient documentation

## 2019-06-02 DIAGNOSIS — M25551 Pain in right hip: Secondary | ICD-10-CM | POA: Insufficient documentation

## 2019-06-02 DIAGNOSIS — R609 Edema, unspecified: Secondary | ICD-10-CM | POA: Insufficient documentation

## 2019-06-23 ENCOUNTER — Telehealth: Payer: Self-pay | Admitting: Pharmacy Technician

## 2019-06-23 NOTE — Telephone Encounter (Signed)
Patient failed to provide requested financial documentation.  Financial documentation is required in order to determine patient's eligibility for [facility]'s program.  No additional medication assistance will be provided until patient provides requested financial documentation.  Patient notified by letter.  Marisabel Macpherson Pharmacy Technician/Eligibility Specialist [facility] 

## 2019-06-27 ENCOUNTER — Other Ambulatory Visit: Payer: Self-pay

## 2019-06-27 ENCOUNTER — Ambulatory Visit: Payer: Self-pay

## 2019-06-27 DIAGNOSIS — B9689 Other specified bacterial agents as the cause of diseases classified elsewhere: Secondary | ICD-10-CM

## 2019-06-27 DIAGNOSIS — Z113 Encounter for screening for infections with a predominantly sexual mode of transmission: Secondary | ICD-10-CM

## 2019-06-27 DIAGNOSIS — N760 Acute vaginitis: Secondary | ICD-10-CM

## 2019-06-27 DIAGNOSIS — N907 Vulvar cyst: Secondary | ICD-10-CM

## 2019-06-27 DIAGNOSIS — F17200 Nicotine dependence, unspecified, uncomplicated: Secondary | ICD-10-CM | POA: Insufficient documentation

## 2019-06-27 LAB — WET PREP FOR TRICH, YEAST, CLUE
Trichomonas Exam: NEGATIVE
Yeast Exam: NEGATIVE

## 2019-06-27 MED ORDER — METRONIDAZOLE 500 MG PO TABS
500.0000 mg | ORAL_TABLET | Freq: Two times a day (BID) | ORAL | 0 refills | Status: AC
Start: 2019-06-27 — End: 2019-07-04

## 2019-06-27 NOTE — Progress Notes (Signed)
Patient treated for BV per SO, after provider review of wet mount.Jenetta Downer, RN

## 2019-06-27 NOTE — Progress Notes (Signed)
    STI clinic/screening visit  Subjective:  Gabrielle Sampson is a 48 y.o. female being seen today for an STI screening visit. The patient reports they do have symptoms.  Patient has the following medical conditions:   Patient Active Problem List   Diagnosis Date Noted  . Dysphagia 09/30/2015  . Benign essential HTN 09/30/2015  . Diabetes ([place]) 09/30/2015  . Obesity 09/30/2015  . Angioedema 09/28/2015  . ABDOMINAL PAIN, EPIGASTRIC 06/18/2007  . PANCREATITIS 05/10/2007  . URTICARIA 05/10/2007  . ANXIETY 04/29/2007  . DEPRESSION 04/29/2007  . HYPERTENSION 04/29/2007  . DERMATOFIBROMA 02/22/2007  . IRRITABLE BOWEL SYNDROME 02/22/2007  . SKIN TAG 02/22/2007  . SLEEP DISORDER 02/22/2007     Chief Complaint  Patient presents with  . Exposure to STD    HPI  Patient reports that she felt a bump on her vulva a wk ago.  Denies pain, swelling, h/o HSV infection/contact.  See flowsheet for further details and programmatic requirements.    The following portions of the patient's history were reviewed and updated as appropriate: allergies, current medications, past medical history, past social history, past surgical history and problem list.  Objective:  There were no vitals filed for this visit.  Physical Exam HENT:     Mouth/Throat:     Mouth: Mucous membranes are moist.     Pharynx: Oropharynx is clear. No oropharyngeal exudate or posterior oropharyngeal erythema.  Neck:     Musculoskeletal: Neck supple. No muscular tenderness.  Abdominal:     Palpations: Abdomen is soft.     Tenderness: There is no abdominal tenderness.  Genitourinary:    General: Normal vulva.     Vagina: Vaginal discharge present.     Comments: sm sebaceous cyst L labia majora;  Mod amt of white disch adheres to wall,  ph,4.5, no odor noted on exam Lymphadenopathy:     Cervical: No cervical adenopathy.  Skin:    General: Skin is warm and dry.     Findings: No lesion or rash.   Neurological:     Mental Status: She is alert.       Assessment and Plan:  Gabrielle Sampson is a 48 y.o. female presenting to the [facility] Department for STI screening  1. Screening examination for venereal disease - WET PREP FOR TRICH, YEAST, CLUE - Chlamydia/Gonorrhea [facility] - Chlamydia/Gonorrhea [facility] Treat wet prep for BV if + for cluesand/or amine with Metronidazole 500 mg po bid x 7 days. 2. Sebaceous cyst Co. Client that no treatment is necessary.  May use warm compress to area 2-3 times/day as needed,  If cyst become larger or signs of infection notify clinic    No follow-ups on file.  No future appointments.  Hassell Done, FNP

## 2019-06-27 NOTE — Progress Notes (Signed)
Patient here for STD testing. Jenetta Downer, RN

## 2019-07-05 ENCOUNTER — Telehealth: Payer: Self-pay

## 2019-07-05 NOTE — Telephone Encounter (Signed)
TC with patient.  Verified ID via SS#. Informed patient that due to the label not being legible with site of collection, state lab would not complete the oropharyngeal test for GC/ Chlamydia.  Patient offered app for retesting; declined at this time.  Informed vaginal GC/Chlamydia negative. Verbalized understanding to call [facility] if she would like to schedule an appt. Aileen Fass, RN

## 2019-09-13 ENCOUNTER — Ambulatory Visit: Payer: Medicaid Other

## 2019-11-04 DIAGNOSIS — M48062 Spinal stenosis, lumbar region with neurogenic claudication: Secondary | ICD-10-CM | POA: Insufficient documentation

## 2019-11-24 ENCOUNTER — Ambulatory Visit: Payer: Medicaid Other | Attending: Internal Medicine

## 2019-11-24 DIAGNOSIS — Z20822 Contact with and (suspected) exposure to covid-19: Secondary | ICD-10-CM

## 2019-11-26 LAB — NOVEL CORONAVIRUS, NAA: SARS-CoV-2, NAA: NOT DETECTED

## 2019-11-28 DIAGNOSIS — G8929 Other chronic pain: Secondary | ICD-10-CM | POA: Insufficient documentation

## 2019-12-07 ENCOUNTER — Ambulatory Visit: Payer: Medicaid Other | Attending: Internal Medicine

## 2019-12-07 ENCOUNTER — Other Ambulatory Visit: Payer: Medicaid Other

## 2019-12-07 DIAGNOSIS — Z20822 Contact with and (suspected) exposure to covid-19: Secondary | ICD-10-CM

## 2019-12-08 LAB — NOVEL CORONAVIRUS, NAA: SARS-CoV-2, NAA: NOT DETECTED

## 2019-12-09 DIAGNOSIS — L304 Erythema intertrigo: Secondary | ICD-10-CM | POA: Insufficient documentation

## 2020-01-26 DIAGNOSIS — Z8719 Personal history of other diseases of the digestive system: Secondary | ICD-10-CM | POA: Insufficient documentation

## 2020-03-07 DIAGNOSIS — M79671 Pain in right foot: Secondary | ICD-10-CM | POA: Insufficient documentation

## 2020-03-07 DIAGNOSIS — G8929 Other chronic pain: Secondary | ICD-10-CM | POA: Insufficient documentation

## 2020-03-07 DIAGNOSIS — D1722 Benign lipomatous neoplasm of skin and subcutaneous tissue of left arm: Secondary | ICD-10-CM | POA: Insufficient documentation

## 2020-03-23 ENCOUNTER — Telehealth: Payer: Self-pay | Admitting: Pharmacist

## 2020-03-23 NOTE — Telephone Encounter (Signed)
Patient failed to provide requested 2021 financial documentation. No additional medication assistance will be provided by [facility] without the required proof of income documentation. Patient notified by letter Debra Cheek Administrative Assistant [facility] 

## 2020-06-29 DIAGNOSIS — R5383 Other fatigue: Secondary | ICD-10-CM | POA: Insufficient documentation

## 2020-06-29 DIAGNOSIS — E785 Hyperlipidemia, unspecified: Secondary | ICD-10-CM | POA: Insufficient documentation

## 2020-07-24 DIAGNOSIS — D489 Neoplasm of uncertain behavior, unspecified: Secondary | ICD-10-CM | POA: Insufficient documentation

## 2020-09-03 DIAGNOSIS — R059 Cough, unspecified: Secondary | ICD-10-CM | POA: Insufficient documentation

## 2021-02-07 DIAGNOSIS — M79662 Pain in left lower leg: Secondary | ICD-10-CM | POA: Insufficient documentation

## 2021-02-07 DIAGNOSIS — M79661 Pain in right lower leg: Secondary | ICD-10-CM | POA: Insufficient documentation

## 2021-03-26 DIAGNOSIS — K644 Residual hemorrhoidal skin tags: Secondary | ICD-10-CM | POA: Insufficient documentation

## 2021-03-26 DIAGNOSIS — J3489 Other specified disorders of nose and nasal sinuses: Secondary | ICD-10-CM | POA: Insufficient documentation

## 2021-03-26 DIAGNOSIS — N83202 Unspecified ovarian cyst, left side: Secondary | ICD-10-CM | POA: Insufficient documentation

## 2021-08-14 DIAGNOSIS — B009 Herpesviral infection, unspecified: Secondary | ICD-10-CM | POA: Insufficient documentation

## 2021-08-27 DIAGNOSIS — K644 Residual hemorrhoidal skin tags: Secondary | ICD-10-CM | POA: Insufficient documentation

## 2022-01-16 DIAGNOSIS — R87615 Unsatisfactory cytologic smear of cervix: Secondary | ICD-10-CM | POA: Insufficient documentation

## 2022-05-01 DIAGNOSIS — Z720 Tobacco use: Secondary | ICD-10-CM | POA: Insufficient documentation

## 2022-05-01 DIAGNOSIS — F17200 Nicotine dependence, unspecified, uncomplicated: Secondary | ICD-10-CM | POA: Insufficient documentation

## 2022-07-16 DIAGNOSIS — M542 Cervicalgia: Secondary | ICD-10-CM | POA: Insufficient documentation

## 2022-08-22 DIAGNOSIS — H9190 Unspecified hearing loss, unspecified ear: Secondary | ICD-10-CM | POA: Insufficient documentation

## 2022-11-25 DIAGNOSIS — Z9189 Other specified personal risk factors, not elsewhere classified: Secondary | ICD-10-CM | POA: Insufficient documentation

## 2022-11-25 DIAGNOSIS — L989 Disorder of the skin and subcutaneous tissue, unspecified: Secondary | ICD-10-CM | POA: Insufficient documentation

## 2023-01-28 DIAGNOSIS — E1165 Type 2 diabetes mellitus with hyperglycemia: Secondary | ICD-10-CM | POA: Diagnosis not present

## 2023-01-28 DIAGNOSIS — K5909 Other constipation: Secondary | ICD-10-CM | POA: Insufficient documentation

## 2023-01-28 DIAGNOSIS — Z794 Long term (current) use of insulin: Secondary | ICD-10-CM | POA: Diagnosis not present

## 2023-01-28 DIAGNOSIS — G8929 Other chronic pain: Secondary | ICD-10-CM | POA: Diagnosis not present

## 2023-01-28 DIAGNOSIS — M25561 Pain in right knee: Secondary | ICD-10-CM | POA: Diagnosis not present

## 2023-01-29 DIAGNOSIS — R197 Diarrhea, unspecified: Secondary | ICD-10-CM | POA: Diagnosis not present

## 2023-01-29 DIAGNOSIS — R140 Abdominal distension (gaseous): Secondary | ICD-10-CM | POA: Diagnosis not present

## 2023-01-30 DIAGNOSIS — R197 Diarrhea, unspecified: Secondary | ICD-10-CM | POA: Diagnosis not present

## 2023-01-30 DIAGNOSIS — R1012 Left upper quadrant pain: Secondary | ICD-10-CM | POA: Diagnosis not present

## 2023-01-30 DIAGNOSIS — K30 Functional dyspepsia: Secondary | ICD-10-CM | POA: Diagnosis not present

## 2023-01-30 DIAGNOSIS — E785 Hyperlipidemia, unspecified: Secondary | ICD-10-CM | POA: Diagnosis not present

## 2023-01-30 DIAGNOSIS — F17210 Nicotine dependence, cigarettes, uncomplicated: Secondary | ICD-10-CM | POA: Diagnosis not present

## 2023-01-30 DIAGNOSIS — T383X5A Adverse effect of insulin and oral hypoglycemic [antidiabetic] drugs, initial encounter: Secondary | ICD-10-CM | POA: Diagnosis not present

## 2023-01-30 DIAGNOSIS — R82998 Other abnormal findings in urine: Secondary | ICD-10-CM | POA: Diagnosis not present

## 2023-01-30 DIAGNOSIS — Z79899 Other long term (current) drug therapy: Secondary | ICD-10-CM | POA: Diagnosis not present

## 2023-01-30 DIAGNOSIS — R140 Abdominal distension (gaseous): Secondary | ICD-10-CM | POA: Diagnosis not present

## 2023-01-30 DIAGNOSIS — E1165 Type 2 diabetes mellitus with hyperglycemia: Secondary | ICD-10-CM | POA: Diagnosis not present

## 2023-01-30 DIAGNOSIS — K3184 Gastroparesis: Secondary | ICD-10-CM | POA: Diagnosis not present

## 2023-01-30 DIAGNOSIS — E119 Type 2 diabetes mellitus without complications: Secondary | ICD-10-CM | POA: Diagnosis not present

## 2023-01-30 DIAGNOSIS — E11649 Type 2 diabetes mellitus with hypoglycemia without coma: Secondary | ICD-10-CM | POA: Diagnosis not present

## 2023-01-30 DIAGNOSIS — Z794 Long term (current) use of insulin: Secondary | ICD-10-CM | POA: Diagnosis not present

## 2023-01-30 DIAGNOSIS — R1013 Epigastric pain: Secondary | ICD-10-CM | POA: Diagnosis not present

## 2023-01-30 DIAGNOSIS — R945 Abnormal results of liver function studies: Secondary | ICD-10-CM | POA: Diagnosis not present

## 2023-01-30 DIAGNOSIS — M25562 Pain in left knee: Secondary | ICD-10-CM | POA: Diagnosis not present

## 2023-01-30 DIAGNOSIS — I10 Essential (primary) hypertension: Secondary | ICD-10-CM | POA: Diagnosis not present

## 2023-01-30 DIAGNOSIS — R1032 Left lower quadrant pain: Secondary | ICD-10-CM | POA: Diagnosis not present

## 2023-01-30 DIAGNOSIS — A048 Other specified bacterial intestinal infections: Secondary | ICD-10-CM | POA: Diagnosis not present

## 2023-01-30 DIAGNOSIS — M25551 Pain in right hip: Secondary | ICD-10-CM | POA: Diagnosis not present

## 2023-01-30 DIAGNOSIS — K219 Gastro-esophageal reflux disease without esophagitis: Secondary | ICD-10-CM | POA: Diagnosis not present

## 2023-01-30 DIAGNOSIS — E1143 Type 2 diabetes mellitus with diabetic autonomic (poly)neuropathy: Secondary | ICD-10-CM | POA: Diagnosis not present

## 2023-01-30 DIAGNOSIS — R110 Nausea: Secondary | ICD-10-CM | POA: Diagnosis not present

## 2023-01-30 DIAGNOSIS — R748 Abnormal levels of other serum enzymes: Secondary | ICD-10-CM | POA: Diagnosis not present

## 2023-01-30 DIAGNOSIS — M25561 Pain in right knee: Secondary | ICD-10-CM | POA: Diagnosis not present

## 2023-01-30 DIAGNOSIS — K5900 Constipation, unspecified: Secondary | ICD-10-CM | POA: Diagnosis not present

## 2023-01-30 DIAGNOSIS — R8290 Unspecified abnormal findings in urine: Secondary | ICD-10-CM | POA: Diagnosis not present

## 2023-01-30 DIAGNOSIS — B9681 Helicobacter pylori [H. pylori] as the cause of diseases classified elsewhere: Secondary | ICD-10-CM | POA: Diagnosis not present

## 2023-01-30 DIAGNOSIS — K521 Toxic gastroenteritis and colitis: Secondary | ICD-10-CM | POA: Diagnosis not present

## 2023-01-31 DIAGNOSIS — R197 Diarrhea, unspecified: Secondary | ICD-10-CM | POA: Insufficient documentation

## 2023-04-07 ENCOUNTER — Encounter: Payer: Self-pay | Admitting: Family Medicine

## 2023-04-09 DIAGNOSIS — E1140 Type 2 diabetes mellitus with diabetic neuropathy, unspecified: Secondary | ICD-10-CM | POA: Insufficient documentation

## 2023-06-29 ENCOUNTER — Ambulatory Visit: Payer: Medicare HMO | Admitting: Podiatry

## 2023-06-29 ENCOUNTER — Encounter: Payer: Self-pay | Admitting: Podiatry

## 2023-06-29 DIAGNOSIS — B3731 Acute candidiasis of vulva and vagina: Secondary | ICD-10-CM | POA: Insufficient documentation

## 2023-06-29 DIAGNOSIS — Z8631 Personal history of diabetic foot ulcer: Secondary | ICD-10-CM

## 2023-06-29 DIAGNOSIS — N920 Excessive and frequent menstruation with regular cycle: Secondary | ICD-10-CM | POA: Insufficient documentation

## 2023-06-29 DIAGNOSIS — M79675 Pain in left toe(s): Secondary | ICD-10-CM

## 2023-06-29 DIAGNOSIS — N898 Other specified noninflammatory disorders of vagina: Secondary | ICD-10-CM | POA: Insufficient documentation

## 2023-06-29 DIAGNOSIS — Q828 Other specified congenital malformations of skin: Secondary | ICD-10-CM | POA: Diagnosis not present

## 2023-06-29 DIAGNOSIS — R1030 Lower abdominal pain, unspecified: Secondary | ICD-10-CM | POA: Insufficient documentation

## 2023-06-29 DIAGNOSIS — M79674 Pain in right toe(s): Secondary | ICD-10-CM | POA: Diagnosis not present

## 2023-06-29 DIAGNOSIS — R7309 Other abnormal glucose: Secondary | ICD-10-CM | POA: Insufficient documentation

## 2023-06-29 DIAGNOSIS — M1990 Unspecified osteoarthritis, unspecified site: Secondary | ICD-10-CM | POA: Insufficient documentation

## 2023-06-29 DIAGNOSIS — E119 Type 2 diabetes mellitus without complications: Secondary | ICD-10-CM | POA: Diagnosis not present

## 2023-06-29 DIAGNOSIS — R102 Pelvic and perineal pain: Secondary | ICD-10-CM | POA: Insufficient documentation

## 2023-06-29 DIAGNOSIS — N926 Irregular menstruation, unspecified: Secondary | ICD-10-CM | POA: Insufficient documentation

## 2023-06-29 DIAGNOSIS — N719 Inflammatory disease of uterus, unspecified: Secondary | ICD-10-CM | POA: Insufficient documentation

## 2023-06-29 DIAGNOSIS — B351 Tinea unguium: Secondary | ICD-10-CM

## 2023-06-29 DIAGNOSIS — R6882 Decreased libido: Secondary | ICD-10-CM | POA: Insufficient documentation

## 2023-06-29 DIAGNOSIS — E1142 Type 2 diabetes mellitus with diabetic polyneuropathy: Secondary | ICD-10-CM

## 2023-06-29 DIAGNOSIS — B001 Herpesviral vesicular dermatitis: Secondary | ICD-10-CM | POA: Insufficient documentation

## 2023-06-29 DIAGNOSIS — L0390 Cellulitis, unspecified: Secondary | ICD-10-CM | POA: Insufficient documentation

## 2023-06-29 NOTE — Patient Instructions (Signed)
Diabetes Mellitus and Foot Care Diabetes, also called diabetes mellitus, may cause problems with your feet and legs because of poor blood flow (circulation). Poor circulation may make your skin: Become thinner and drier. Break more easily. Heal more slowly. Peel and crack. You may also have nerve damage (neuropathy). This can cause decreased feeling in your legs and feet. This means that you may not notice minor injuries to your feet that could lead to more serious problems. Finding and treating problems early is the best way to prevent future foot problems. How to care for your feet Foot hygiene  Wash your feet daily with warm water and mild soap. Do not use hot water. Then, pat your feet and the areas between your toes until they are fully dry. Do not soak your feet. This can dry your skin. Trim your toenails straight across. Do not dig under them or around the cuticle. File the edges of your nails with an emery board or nail file. Apply a moisturizing lotion or petroleum jelly to the skin on your feet and to dry, brittle toenails. Use lotion that does not contain alcohol and is unscented. Do not apply lotion between your toes. Shoes and socks Wear clean socks or stockings every day. Make sure they are not too tight. Do not wear knee-high stockings. These may decrease blood flow to your legs. Wear shoes that fit well and have enough cushioning. Always look in your shoes before you put them on to be sure there are no objects inside. To break in new shoes, wear them for just a few hours a day. This prevents injuries on your feet. Wounds, scrapes, corns, and calluses  Check your feet daily for blisters, cuts, bruises, sores, and redness. If you cannot see the bottom of your feet, use a mirror or ask someone for help. Do not cut off corns or calluses or try to remove them with medicine. If you find a minor scrape, cut, or break in the skin on your feet, keep it and the skin around it clean and  dry. You may clean these areas with mild soap and water. Do not clean the area with peroxide, alcohol, or iodine. If you have a wound, scrape, corn, or callus on your foot, look at it several times a day to make sure it is healing and not infected. Check for: Redness, swelling, or pain. Fluid or blood. Warmth. Pus or a bad smell. General tips Do not cross your legs. This may decrease blood flow to your feet. Do not use heating pads or hot water bottles on your feet. They may burn your skin. If you have lost feeling in your feet or legs, you may not know this is happening until it is too late. Protect your feet from hot and cold by wearing shoes, such as at the beach or on hot pavement. Schedule a complete foot exam at least once a year or more often if you have foot problems. Report any cuts, sores, or bruises to your health care provider right away. Where to find more information American Diabetes Association: diabetes.org Association of Diabetes Care & Education Specialists: diabeteseducator.org Contact a health care provider if: You have a condition that increases your risk of infection, and you have any cuts, sores, or bruises on your feet. You have an injury that is not healing. You have redness on your legs or feet. You feel burning or tingling in your legs or feet. You have pain or cramps in your legs  and feet. Your legs or feet are numb. Your feet always feel cold. You have pain around any toenails. Get help right away if: You have a wound, scrape, corn, or callus on your foot and: You have signs of infection. You have a fever. You have a red line going up your leg. This information is not intended to replace advice given to you by your health care provider. Make sure you discuss any questions you have with your health care provider. Document Revised: 05/07/2022 Document Reviewed: 05/07/2022 Elsevier Patient Education  2024 Elsevier Inc.  Smoking Tobacco Information,  Adult Smoking tobacco can be harmful to your health. Tobacco contains a toxic colorless chemical called nicotine. Nicotine causes changes in your brain that make you want more and more. This is called addiction. This can make it hard to stop smoking once you start. Tobacco also has other toxic chemicals that can hurt your body and raise your risk of many cancers. Menthol or "lite" tobacco or cigarette brands are not safer than regular brands. How can smoking tobacco affect me? Smoking tobacco puts you at risk for: Cancer. Smoking is most commonly associated with lung cancer, but can also lead to cancer in other parts of the body. Chronic obstructive pulmonary disease (COPD). This is a long-term lung condition that makes it hard to breathe. It also gets worse over time. High blood pressure (hypertension), heart disease, stroke, heart attack, and lung infections, such as pneumonia. Cataracts. This is when the lenses in the eyes become clouded. Digestive problems. This may include peptic ulcers, heartburn, and gastroesophageal reflux disease (GERD). Oral health problems, such as gum disease, mouth sores, and tooth loss. Loss of taste and smell. Smoking also affects how you look and smell. Smoking may cause: Wrinkles. Yellow or stained teeth, fingers, and fingernails. Bad breath. Bad-smelling clothes and hair. Smoking tobacco can also affect your social life, because: It may be challenging to find places to smoke when away from home. Many workplaces, [employer], hotels, and public places are tobacco-free. Smoking is expensive. This is due to the cost of tobacco and the long-term costs of treating health problems from smoking. Secondhand smoke may affect those around you. Secondhand smoke can cause lung cancer, breathing problems, and heart disease. Children of smokers have a higher risk for: Sudden infant death syndrome (SIDS). Ear infections. Lung infections. What actions can I take to prevent  health problems? Quit smoking  Do not start smoking. Quit if you already smoke. Do not replace cigarette smoking with vaping devices, such as e-cigarettes. Make a plan to quit smoking and commit to it. Look for programs to help you, and ask your health care provider for recommendations and ideas. Set a date and write down all the reasons you want to quit. Let your friends and family know you are quitting so they can help and support you. Consider finding friends who also want to quit. It can be easier to quit with someone else, so that you can support each other. Talk with your health care provider about using nicotine replacement medicines to help you quit. These include gum, lozenges, patches, sprays, or pills. If you try to quit but return to smoking, stay positive. It is common to slip up when you first quit, so take it one day at a time. Be prepared for cravings. When you feel the urge to smoke, chew gum or suck on hard candy. Lifestyle Stay busy. Take care of your body. Get plenty of exercise, eat a healthy diet,  and drink plenty of water. Find ways to manage your stress, such as meditation, yoga, exercise, or time spent with friends and family. Ask your health care provider about having regular tests (screenings) to check for cancer. This may include blood tests, imaging tests, and other tests. Where to find support To get support to quit smoking, consider: Asking your health care provider for more information and resources. Joining a support group for people who want to quit smoking in your local community. There are many effective programs that may help you to quit. Calling the smokefree.gov counselor helpline at 1-800-QUIT-NOW 231-224-5374). Where to find more information You may find more information about quitting smoking from: Centers for Disease Control and Prevention: http://www.osborne.com/ BankR[employer]: smokefree.gov American Lung Association: freedomfromsmoking.org Contact a  health care provider if: You have problems breathing. Your lips, nose, or fingers turn blue. You have chest pain. You are coughing up blood. You feel like you will faint. You have other health changes that cause you to worry. Summary Smoking tobacco can negatively affect your health, the health of those around you, your finances, and your social life. Do not start smoking. Quit if you already smoke. If you need help quitting, ask your health care provider. Consider joining a support group for people in your local community who want to quit smoking. There are many effective programs that may help you to quit. This information is not intended to replace advice given to you by your health care provider. Make sure you discuss any questions you have with your health care provider. Document Revised: 10/29/2021 Document Reviewed: 10/29/2021 Elsevier Patient Education  2024 [employer].

## 2023-07-04 NOTE — Progress Notes (Signed)
ANNUAL DIABETIC FOOT EXAM  Subjective: Gabrielle Sampson presents today annual diabetic foot exam.  Patient confirms h/o diabetes.  Patient denies any h/o foot wounds.  Patient has h/o foot ulcer of b/l lower extremities.  Patient has been diagnosed with neuropathy.  Risk factors: diabetes, diabetic neuropathy, chronic lower extremity edema, HTN, hyperlipidemia, current tobacco user.  Oswaldo Conroy, MD is patient's PCP.  Past Medical History:  Diagnosis Date   Anxiety    Diabetes (HCC)    Fatigue    HBP (high blood pressure)    Morbid obesity (HCC)    Neuropathy    Osteoarthritis    Pancreatitis    Reflux    Patient Active Problem List   Diagnosis Date Noted   Elevated hemoglobin A1c 06/29/2023   Arthritis 06/29/2023   Candidiasis of vagina 06/29/2023   Cellulitis 06/29/2023   Endometritis 06/29/2023   Herpes labialis 06/29/2023   Irregular periods 06/29/2023   Lower abdominal pain 06/29/2023   Menorrhagia 06/29/2023   Pain in pelvis 06/29/2023   Reduced libido 06/29/2023   Vaginal discharge 06/29/2023   Neuropathy due to type 2 diabetes mellitus (HCC) 04/09/2023   Diarrhea 01/31/2023   At risk for polypharmacy 11/25/2022   Painful skin lesion 11/25/2022   Hearing loss 08/22/2022   Neck pain on left side 07/16/2022   Tobacco use disorder 05/01/2022   Encounter for repeat Pap smear due to previous insufficient cervical cells 01/16/2022   Anal skin tag 08/27/2021   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   External hemorrhoid 03/26/2021   Internal nasal lesion 03/26/2021   Left ovarian cyst 03/26/2021   Bilateral calf pain 02/07/2021   Cough 09/03/2020   Neoplasm of uncertain behavior 07/24/2020   Fatigue 06/29/2020   Hyperlipidemia 06/29/2020   Lipoma of left upper extremity 03/07/2020   Foot pain, right 03/07/2020   History of acute pancreatitis 01/26/2020   Intertrigo 12/09/2019   Chronic pain of right knee 11/28/2019   Spinal stenosis of lumbar  region with neurogenic claudication 11/04/2019   Current every day smoker 06/27/2019   Edema 06/02/2019   Varicose vein of leg 07/16/2016   Dysphagia 09/30/2015   Benign essential HTN 09/30/2015   Diabetes (HCC) 09/30/2015   Obesity 09/30/2015   Angioedema 09/28/2015   GERD without esophagitis 09/07/2015   Reactive airway disease without complication 07/13/2015   Trochanteric bursitis of both hips 06/23/2015   Restless leg syndrome 02/28/2015   History of MRSA infection 01/27/2015   ABDOMINAL PAIN, EPIGASTRIC 06/18/2007   PANCREATITIS 05/10/2007   URTICARIA 05/10/2007   ANXIETY 04/29/2007   DEPRESSION 04/29/2007   HYPERTENSION 04/29/2007   DERMATOFIBROMA 02/22/2007   IRRITABLE BOWEL SYNDROME 02/22/2007   SKIN TAG 02/22/2007   SLEEP DISORDER 02/22/2007   Past Surgical History:  Procedure Laterality Date   ABSCESS DRAINAGE     MRSA from abdominal wound   ANAL FISSURE REPAIR     CARDIAC CATHETERIZATION     CESAREAN SECTION  1996 AND 1999   ESOPHAGOGASTRODUODENOSCOPY (EGD) WITH PROPOFOL N/A 10/25/2015   Procedure: ESOPHAGOGASTRODUODENOSCOPY (EGD) WITH PROPOFOL;  Surgeon: Scot Jun, MD;  Location: [facility] END[facility]COPY;  Service: Endoscopy;  Laterality: N/A;   GALLBLADDER SURGERY  1996   KNEE SURGERY Right 2012   TONSILLECTOMY AND ADENOIDECTOMY     TUBAL LIGATION     Current Outpatient Medications on File Prior to Visit  Medication Sig Dispense Refill   ALPRAZolam (XANAX) 0.5 MG tablet Take 0.5 mg by mouth 2 (two) times daily as needed for  anxiety or sleep.      ARIPiprazole (ABILIFY) 2 MG tablet Take 2 mg by mouth daily.     busPIRone (BUSPAR) 10 MG tablet Take 10 mg by mouth once.     citalopram (CELEXA) 40 MG tablet Take 40 mg by mouth daily.     ferrous gluconate (FERGON) 324 MG tablet Take 324 mg by mouth daily with breakfast.     gabapentin (NEURONTIN) 400 MG capsule Take 400 mg by mouth See admin instructions. 2 capsules every morning, 1 capsule every lunch, and 2  capsules at bedtime     hydrochlorothiazide (HYDRODIURIL) 25 MG tablet Take 25 mg by mouth daily.     LANTUS SOLOSTAR 100 UNIT/ML Solostar Pen Inject 5 Units into the skin at bedtime.     metoprolol (LOPRESSOR) 50 MG tablet Take 50 mg by mouth 2 (two) times daily.     norgestimate-ethinyl estradiol (ORTHO-CYCLEN,SPRINTEC,PREVIFEM) 0.25-35 MG-MCG tablet Take 1 tablet by mouth daily.     nystatin (MYCOSTATIN/NYSTOP) 100000 UNIT/GM POWD Apply 100,000 Units topically 2 (two) times daily.     omeprazole (PRILOSEC) 40 MG capsule Take 40 mg by mouth daily.     predniSONE (DELTASONE) 20 MG tablet Take 1 tablet (20 mg total) by mouth daily with breakfast. 2 tablet 0   predniSONE (DELTASONE) 20 MG tablet Take 2 tablets (40 mg total) by mouth daily. (Patient not taking: Reported on 06/27/2019) 10 tablet 0   No current facility-administered medications on file prior to visit.    Allergies  Allergen Reactions   Lisinopril Swelling and Anaphylaxis   Niacin Rash   Sulfa Antibiotics Hives   Betadine [Povidone Iodine] Hives   Codeine Phosphate Nausea And Vomiting   Empagliflozin Other (See Comments)    Contraindicated due to hx of groin infection   Iodine Hives   Morphine And Codeine    Social History   Occupational History   Not on file  Tobacco Use   Smoking status: Every Day    Current packs/day: 0.00    Average packs/day: 0.5 packs/day for 20.0 years (10.0 ttl pk-yrs)    Types: Cigarettes    Start date: 08/08/1993    Last attempt to quit: 08/08/2013    Years since quitting: 9.9   Smokeless tobacco: Never  Substance and Sexual Activity   Alcohol use: No   Drug use: No   Sexual activity: Not on file   Family History  Problem Relation Age of Onset   Hypothyroidism Mother    Melanoma Mother    CAD Father    Immunization History  Administered Date(s) Administered   Moderna Sars-Covid-2 Vaccination 05/19/2020, 06/28/2020   Td 01/19/2007     Review of Systems: Negative except as noted  in the HPI.   Objective: There were no vitals filed for this visit.  Gabrielle Sampson is a pleasant 52 y.o. female in NAD. AAO X 3.  Vascular Examination: Capillary refill time immediate b/l. Vascular status intact b/l with palpable pedal pulses. Pedal hair absent b/l. No pain with calf compression b/l. Skin temperature gradient WNL b/l. No cyanosis or clubbing b/l. No ischemia or gangrene noted b/l.   Neurological Examination: Protective sensation diminished with 10g monofilament b/l. Vibratory sensation intact b/l.  Dermatological Examination: Pedal skin with normal turgor, texture and tone b/l.  No open wounds. No interdigital macerations.   Toenails 1-5 b/l thick, discolored, elongated with subungual debris and pain on dorsal palpation.   Pedal integument with normal turgor, texture and tone BLE. No  open wounds b/l LE. No interdigital macerations noted b/l LE. Palpable subcutaneous fixed STM right arch area with no POP.Marland Kitchen Porokeratotic lesion(s) right foot. No erythema, no edema, no drainage, no fluctuance.  Musculoskeletal Examination: Muscle strength 5/5 to all lower extremity muscle groups bilaterally. No pain, crepitus or joint limitation noted with ROM bilateral LE. No gross bony deformities bilaterally.  Radiographs: None  ADA Risk Categorization: High Risk  Patient has one or more of the following: Loss of protective sensation Absent pedal pulses Severe Foot deformity History of foot ulcer  Assessment: 1. Pain due to onychomycosis of toenails of both feet   2. History of diabetic ulcer of foot   3. Porokeratosis   4. Diabetic peripheral neuropathy associated with type 2 diabetes mellitus (HCC)   5. Encounter for diabetic foot exam [facility])     Plan: -Patient was evaluated and treated. All patient's and/or POA's questions/concerns answered on today's visit. -Diabetic foot examination performed today. -Continue diabetic foot care principles: inspect feet daily,  monitor glucose as recommended by PCP and/or Endocrinologist, and follow prescribed diet per PCP, Endocrinologist and/or dietician. -Patient to continue soft, supportive shoe gear daily. -Toenails 1-5 b/l were debrided in length and girth with sterile nail nippers and dremel without iatrogenic bleeding.  -Porokeratotic lesion(s) right foot pared and enucleated with sterile currette without incident. Total number of lesions debrided=1. -Patient scheduled to see Dr. Lilian Kapur in 2-3 weeks for follow up of plantar lesion right plantar arch. -Patient/POA to call should there be question/concern in the interim. Return in about 3 months (around 09/29/2023).  Freddie Breech, DPM

## 2023-07-15 ENCOUNTER — Ambulatory Visit (INDEPENDENT_AMBULATORY_CARE_PROVIDER_SITE_OTHER): Payer: Medicare HMO | Admitting: Podiatry

## 2023-07-15 DIAGNOSIS — Z91199 Patient's noncompliance with other medical treatment and regimen due to unspecified reason: Secondary | ICD-10-CM

## 2023-07-16 NOTE — Progress Notes (Signed)
Patient was no-show for appointment today 

## 2023-09-10 DIAGNOSIS — M79601 Pain in right arm: Secondary | ICD-10-CM | POA: Insufficient documentation

## 2023-09-10 DIAGNOSIS — A048 Other specified bacterial intestinal infections: Secondary | ICD-10-CM | POA: Insufficient documentation

## 2023-10-02 ENCOUNTER — Ambulatory Visit: Payer: Medicare HMO | Admitting: Podiatry

## 2023-12-29 DIAGNOSIS — E11319 Type 2 diabetes mellitus with unspecified diabetic retinopathy without macular edema: Secondary | ICD-10-CM | POA: Insufficient documentation

## 2024-01-31 DIAGNOSIS — B9689 Other specified bacterial agents as the cause of diseases classified elsewhere: Secondary | ICD-10-CM | POA: Insufficient documentation

## 2024-04-08 ENCOUNTER — Inpatient Hospital Stay
Admission: RE | Admit: 2024-04-08 | Discharge: 2024-04-08 | Disposition: A | Discharge status: Home / Self Care | Payer: Self-pay | Source: Ambulatory Visit | Attending: Orthopedic Surgery | Admitting: Orthopedic Surgery

## 2024-04-08 ENCOUNTER — Other Ambulatory Visit: Payer: Self-pay | Admitting: Family Medicine

## 2024-04-08 DIAGNOSIS — Z049 Encounter for examination and observation for unspecified reason: Secondary | ICD-10-CM

## 2024-04-08 NOTE — Progress Notes (Deleted)
 Referring Physician:  Dionicia Frater, MD [address] [place],  [place] [address]  Primary Physician:  Gabrielle Frater, MD  History of Present Illness: 04/08/2024*** [name] has a history of HTN, reactive airway disease, IBS, pancreatitis, GERD, DM with neuropathy, depression, hyperlipidemia, obesity, RLS.   Previous lumbar MRI In 2022 showed severe spinal stenosis L4-L5.     Low back pain  Duration: *** Location: *** Quality: *** Severity: ***  Precipitating: aggravated by *** Modifying factors: made better by *** Weakness: none Timing: ***  She smokes ***.   Bowel/Bladder Dysfunction: none  Conservative measures:  Physical therapy: has not participated in PT  Multimodal medical therapy including regular antiinflammatories: Flexeril, Oxycodone, Lidocaine  patches, Tylenol , Cymbalta, Lyrica  Injections:  L4-L5 [facility] 02/17/22 [facility] L4-L5 IL [facility] 10/31/21 [facility]  Past Surgery: none  Gabrielle Sampson has ***no symptoms of cervical myelopathy.  The symptoms are causing a significant impact on the patient's life.   Review of Systems:  A 10 point review of systems is negative, except for the pertinent positives and negatives detailed in the HPI.  Past Medical History: Past Medical History:  Diagnosis Date   Anxiety    Diabetes (HCC)    Fatigue    HBP (high blood pressure)    Morbid obesity (HCC)    Neuropathy    Osteoarthritis    Pancreatitis    Reflux     Past Surgical History: Past Surgical History:  Procedure Laterality Date   ABSCESS DRAINAGE     MRSA from abdominal wound   ANAL FISSURE REPAIR     CARDIAC CATHETERIZATION     CESAREAN SECTION  1996 AND 1999   ESOPHAGOGASTRODUODENOSCOPY (EGD) WITH PROPOFOL  N/A 10/25/2015   Procedure: ESOPHAGOGASTRODUODENOSCOPY (EGD) WITH PROPOFOL ;  Surgeon: Cassie Click, MD;  Location[facility] ENDOSCOPY;  Service: Endoscopy;  Laterality: N/A;   GALLBLADDER SURGERY  1996   KNEE  SURGERY Right 2012   TONSILLECTOMY AND ADENOIDECTOMY     TUBAL LIGATION      Allergies: Allergies as of 04/14/2024 - Review Complete 06/29/2023  Allergen Reaction Noted   Lisinopril Swelling and Anaphylaxis 09/28/2015   Niacin Rash 01/16/2009   Sulfa antibiotics Hives 06/14/2015   Betadine [povidone iodine] Hives 08/19/2013   Codeine phosphate Nausea And Vomiting 01/19/2007   Empagliflozin Other (See Comments) 02/15/2021   Iodine Hives 08/18/2013   Morphine and codeine  08/18/2013    Medications: Outpatient Encounter Medications as of 04/14/2024  Medication Sig   ALPRAZolam  (XANAX ) 0.5 MG tablet Take 0.5 mg by mouth 2 (two) times daily as needed for anxiety or sleep.    ARIPiprazole (ABILIFY) 2 MG tablet Take 2 mg by mouth daily.   busPIRone (BUSPAR) 10 MG tablet Take 10 mg by mouth once.   citalopram  (CELEXA ) 40 MG tablet Take 40 mg by mouth daily.   ferrous gluconate (FERGON) 324 MG tablet Take 324 mg by mouth daily with breakfast.   gabapentin  (NEURONTIN ) 400 MG capsule Take 400 mg by mouth See admin instructions. 2 capsules every morning, 1 capsule every lunch, and 2 capsules at bedtime   hydrochlorothiazide (HYDRODIURIL) 25 MG tablet Take 25 mg by mouth daily.   LANTUS  SOLOSTAR 100 UNIT/ML Solostar Pen Inject 5 Units into the skin at bedtime.   metoprolol  (LOPRESSOR ) 50 MG tablet Take 50 mg by mouth 2 (two) times daily.   norgestimate -ethinyl estradiol  (ORTHO-CYCLEN,SPRINTEC,PREVIFEM) 0.25-35 MG-MCG tablet Take 1 tablet by mouth daily.   nystatin (MYCOSTATIN/NYSTOP) 100000 UNIT/GM  POWD Apply 100,000 Units topically 2 (two) times daily.   omeprazole (PRILOSEC) 40 MG capsule Take 40 mg by mouth daily.   predniSONE  (DELTASONE ) 20 MG tablet Take 1 tablet (20 mg total) by mouth daily with breakfast.   predniSONE  (DELTASONE ) 20 MG tablet Take 2 tablets (40 mg total) by mouth daily. (Patient not taking: Reported on 06/27/2019)   No facility-administered encounter medications on file  as of 04/14/2024.    Social History: Social History   Tobacco Use   Smoking status: Every Day    Current packs/day: 0.00    Average packs/day: 0.5 packs/day for 20.0 years (10.0 ttl pk-yrs)    Types: Cigarettes    Start date: 08/08/1993    Last attempt to quit: 08/08/2013    Years since quitting: 10.6   Smokeless tobacco: Never  Substance Use Topics   Alcohol use: No   Drug use: No    Family Medical History: Family History  Problem Relation Age of Onset   Hypothyroidism Mother    Melanoma Mother    CAD Father     Physical Examination: There were no vitals filed for this visit.  General: Patient is well developed, well nourished, calm, collected, and in no apparent distress. Attention to examination is appropriate.  Respiratory: Patient is breathing without any difficulty.   NEUROLOGICAL:     Awake, alert, oriented to person, place, and time.  Speech is clear and fluent. Fund of knowledge is appropriate.   Cranial Nerves: Pupils equal round and reactive to light.  Facial tone is symmetric.    *** ROM of cervical spine *** pain *** posterior cervical tenderness. *** tenderness in bilateral trapezial region.   *** ROM of lumbar spine *** pain *** posterior lumbar tenderness.   No abnormal lesions on exposed skin.   Strength: Side Biceps Triceps Deltoid Interossei Grip Wrist Ext. Wrist Flex.  R 5 5 5 5 5 5 5   L 5 5 5 5 5 5 5    Side Iliopsoas Quads Hamstring PF DF EHL  R 5 5 5 5 5 5   L 5 5 5 5 5 5    Reflexes are ***2+ and symmetric at the biceps, brachioradialis, patella and achilles.   Hoffman's is absent.  Clonus is not present.   Bilateral upper and lower extremity sensation is intact to light touch.     Gait is normal.   ***No difficulty with tandem gait.    Medical Decision Making  Imaging: Nothing recent.   Assessment and Plan: [name] is a pleasant 53 y.o. female has ***  Treatment options discussed with patient and following plan made:   -  Order for physical therapy for *** spine ***. Patient to call to schedule appointment. *** - Continue current medications including ***. Reviewed dosing and side effects.  - Prescription for ***. Reviewed dosing and side effects. Take with food.  - Prescription for *** to take prn muscle spasms. Reviewed dosing and side effects. Discussed this can cause drowsiness.  - MRI of *** to further evaluate *** radiculopathy. No improvement time or medications (***).  - Referral to PMR at [facility] to discuss possible *** injections.  - Will schedule phone visit to review MRI results once I get them back.   I spent a total of *** minutes in face-to-face and non-face-to-face activities related to this patient's care today including review of outside records, review of imaging, review of symptoms, physical exam, discussion of differential diagnosis, discussion of treatment options, and documentation.  Thank you for involving me in the care of this patient.   Lucetta Russel PA-C Dept. of Neurosurgery

## 2024-04-13 ENCOUNTER — Ambulatory Visit: Admitting: Orthopedic Surgery

## 2024-04-14 ENCOUNTER — Ambulatory Visit: Admitting: Orthopedic Surgery

## 2024-04-15 NOTE — Progress Notes (Deleted)
 Referring Physician:  Dionicia Frater, MD [address] [place],  [place] [address]  Primary Physician:  Gabrielle Frater, MD  History of Present Illness: 04/15/2024*** Gabrielle Sampson has a history of HTN, reactive airway disease, IBS, pancreatitis, GERD, DM with neuropathy, depression, hyperlipidemia, obesity, RLS.   Previous lumbar MRI In 2022 showed severe spinal stenosis L4-L5.     Low back pain  Duration: *** Location: *** Quality: *** Severity: ***  Precipitating: aggravated by *** Modifying factors: made better by *** Weakness: none Timing: ***  She smokes ***.   Bowel/Bladder Dysfunction: none  Conservative measures:  Physical therapy: has not participated in PT  Multimodal medical therapy including regular antiinflammatories: Flexeril, Oxycodone, Lidocaine  patches, Tylenol , Cymbalta, Lyrica  Injections:  L4-L5 [facility]/22 [facility] L4-L5 [facility]  Past Surgery: none  Gabrielle Sampson has ***no symptoms of cervical myelopathy.  The symptoms are causing a significant impact on the patient's life.   Review of Systems:  A 10 point review of systems is negative, except for the pertinent positives and negatives detailed in the HPI.  Past Medical History: Past Medical History:  Diagnosis Date   Anxiety    Diabetes (HCC)    Fatigue    HBP (high blood pressure)    Morbid obesity (HCC)    Neuropathy    Osteoarthritis    Pancreatitis    Reflux     Past Surgical History: Past Surgical History:  Procedure Laterality Date   ABSCESS DRAINAGE     MRSA from abdominal wound   ANAL FISSURE REPAIR     CARDIAC CATHETERIZATION     CESAREAN SECTION  1996 AND 1999   ESOPHAGOGASTRODUODENOSCOPY (EGD) WITH PROPOFOL  N/A 10/25/2015   Procedure: ESOPHAGOGASTRODUODENOSCOPY (EGD) WITH PROPOFOL ;  Surgeon: Gabrielle Click, MD;  Location: [facility] ENDOSCOPY;  Service: Endoscopy;  Laterality: N/A;   GALLBLADDER SURGERY  1996   KNEE  SURGERY Right 2012   TONSILLECTOMY AND ADENOIDECTOMY     TUBAL LIGATION      Allergies: Allergies as of 04/26/2024 - Review Complete 06/29/2023  Allergen Reaction Noted   Lisinopril Swelling and Anaphylaxis 09/28/2015   Niacin Rash 01/16/2009   Sulfa antibiotics Hives 06/14/2015   Betadine [povidone iodine] Hives 08/19/2013   Codeine phosphate Nausea And Vomiting 01/19/2007   Empagliflozin Other (See Comments) 02/15/2021   Iodine Hives 08/18/2013   Morphine and codeine  08/18/2013    Medications: Outpatient Encounter Medications as of 04/26/2024  Medication Sig   ALPRAZolam  (XANAX ) 0.5 MG tablet Take 0.5 mg by mouth 2 (two) times daily as needed for anxiety or sleep.    ARIPiprazole (ABILIFY) 2 MG tablet Take 2 mg by mouth daily.   busPIRone (BUSPAR) 10 MG tablet Take 10 mg by mouth once.   citalopram  (CELEXA ) 40 MG tablet Take 40 mg by mouth daily.   ferrous gluconate (FERGON) 324 MG tablet Take 324 mg by mouth daily with breakfast.   gabapentin  (NEURONTIN ) 400 MG capsule Take 400 mg by mouth See admin instructions. 2 capsules every morning, 1 capsule every lunch, and 2 capsules at bedtime   hydrochlorothiazide (HYDRODIURIL) 25 MG tablet Take 25 mg by mouth daily.   LANTUS  SOLOSTAR 100 UNIT/ML Solostar Pen Inject 5 Units into the skin at bedtime.   metoprolol  (LOPRESSOR ) 50 MG tablet Take 50 mg by mouth 2 (two) times daily.   norgestimate -ethinyl estradiol  (ORTHO-CYCLEN,SPRINTEC,PREVIFEM) 0.25-35 MG-MCG tablet Take 1 tablet by mouth daily.   nystatin (MYCOSTATIN/NYSTOP) 100000 UNIT/GM  POWD Apply 100,000 Units topically 2 (two) times daily.   omeprazole (PRILOSEC) 40 MG capsule Take 40 mg by mouth daily.   predniSONE  (DELTASONE ) 20 MG tablet Take 1 tablet (20 mg total) by mouth daily with breakfast.   predniSONE  (DELTASONE ) 20 MG tablet Take 2 tablets (40 mg total) by mouth daily. (Patient not taking: Reported on 06/27/2019)   No facility-administered encounter medications on file  as of 04/26/2024.    Social History: Social History   Tobacco Use   Smoking status: Every Day    Current packs/day: 0.00    Average packs/day: 0.5 packs/day for 20.0 years (10.0 ttl pk-yrs)    Types: Cigarettes    Start date: 08/08/1993    Last attempt to quit: 08/08/2013    Years since quitting: 10.6   Smokeless tobacco: Never  Substance Use Topics   Alcohol use: No   Drug use: No    Family Medical History: Family History  Problem Relation Age of Onset   Hypothyroidism Mother    Melanoma Mother    CAD Father     Physical Examination: There were no vitals filed for this visit.  General: Patient is well developed, well nourished, calm, collected, and in no apparent distress. Attention to examination is appropriate.  Respiratory: Patient is breathing without any difficulty.   NEUROLOGICAL:     Awake, alert, oriented to person, place, and time.  Speech is clear and fluent. Fund of knowledge is appropriate.   Cranial Nerves: Pupils equal round and reactive to light.  Facial tone is symmetric.    *** ROM of cervical spine *** pain *** posterior cervical tenderness. *** tenderness in bilateral trapezial region.   *** ROM of lumbar spine *** pain *** posterior lumbar tenderness.   No abnormal lesions on exposed skin.   Strength: Side Biceps Triceps Deltoid Interossei Grip Wrist Ext. Wrist Flex.  R 5 5 5 5 5 5 5   L 5 5 5 5 5 5 5    Side Iliopsoas Quads Hamstring PF DF EHL  R 5 5 5 5 5 5   L 5 5 5 5 5 5    Reflexes are ***2+ and symmetric at the biceps, brachioradialis, patella and achilles.   Hoffman's is absent.  Clonus is not present.   Bilateral upper and lower extremity sensation is intact to light touch.     Gait is normal.   ***No difficulty with tandem gait.    Medical Decision Making  Imaging: Nothing recent.   Assessment and Plan: Gabrielle Sampson is a pleasant 53 y.o. female has ***  Treatment options discussed with patient and following plan made:   -  Order for physical therapy for *** spine ***. Patient to call to schedule appointment. *** - Continue current medications including ***. Reviewed dosing and side effects.  - Prescription for ***. Reviewed dosing and side effects. Take with food.  - Prescription for *** to take prn muscle spasms. Reviewed dosing and side effects. Discussed this can cause drowsiness.  - MRI of *** to further evaluate *** radiculopathy. No improvement time or medications (***).  - Referral to PMR at [facility] to discuss possible *** injections.  - Will schedule phone visit to review MRI results once I get them back.   I spent a total of *** minutes in face-to-face and non-face-to-face activities related to this patient's care today including review of outside records, review of imaging, review of symptoms, physical exam, discussion of differential diagnosis, discussion of treatment options, and documentation.  Thank you for involving me in the care of this patient.   Lucetta Russel PA-C Dept. of Neurosurgery

## 2024-04-26 ENCOUNTER — Ambulatory Visit: Admitting: Orthopedic Surgery

## 2024-04-26 NOTE — Progress Notes (Deleted)
 Referring Physician:  Dionicia Frater, MD [address] [place],  [place] [address]  Primary Physician:  Dionicia Frater, MD  History of Present Illness: 04/26/2024*** [name] has a history of HTN, reactive airway disease, IBS, pancreatitis, GERD, DM with neuropathy, depression, hyperlipidemia, obesity, RLS.   Previous lumbar MRI In 2022 showed severe spinal stenosis L4-L5.     Low back pain  Duration: *** Location: *** Quality: *** Severity: ***  Precipitating: aggravated by *** Modifying factors: made better by *** Weakness: none Timing: ***  She smokes ***.   Bowel/Bladder Dysfunction: none  Conservative measures:  Physical therapy: has not participated in PT  Multimodal medical therapy including regular antiinflammatories: Flexeril, Oxycodone, Lidocaine  patches, Tylenol , Cymbalta, Lyrica  Injections:  L4-L5 IL [facility] L4-L5 [facility]  Past Surgery: none  Gabrielle Sampson has ***no symptoms of cervical myelopathy.  The symptoms are causing a significant impact on the patient's life.   Review of Systems:  A 10 point review of systems is negative, except for the pertinent positives and negatives detailed in the HPI.  Past Medical History: Past Medical History:  Diagnosis Date   Anxiety    Diabetes (HCC)    Fatigue    HBP (high blood pressure)    Morbid obesity (HCC)    Neuropathy    Osteoarthritis    Pancreatitis    Reflux     Past Surgical History: Past Surgical History:  Procedure Laterality Date   ABSCESS DRAINAGE     MRSA from abdominal wound   ANAL FISSURE REPAIR     CARDIAC CATHETERIZATION     CESAREAN SECTION  1996 AND 1999   ESOPHAGOGASTRODUODENOSCOPY (EGD) WITH PROPOFOL  N/A 10/25/2015   Procedure: ESOPHAGOGASTRODUODENOSCOPY (EGD) WITH PROPOFOL ;  Surgeon: Cassie Click, MD;  Location: [facility] ENDOSCOPY;  Service: Endoscopy;  Laterality: N/A;   GALLBLADDER SURGERY  1996   KNEE  SURGERY Right 2012   TONSILLECTOMY AND ADENOIDECTOMY     TUBAL LIGATION      Allergies: Allergies as of 05/05/2024 - Review Complete 06/29/2023  Allergen Reaction Noted   Lisinopril Swelling and Anaphylaxis 09/28/2015   Niacin Rash 01/16/2009   Sulfa antibiotics Hives 06/14/2015   Betadine [povidone iodine] Hives 08/19/2013   Codeine phosphate Nausea And Vomiting 01/19/2007   Empagliflozin Other (See Comments) 02/15/2021   Iodine Hives 08/18/2013   Morphine and codeine  08/18/2013    Medications: Outpatient Encounter Medications as of 05/05/2024  Medication Sig   ALPRAZolam  (XANAX ) 0.5 MG tablet Take 0.5 mg by mouth 2 (two) times daily as needed for anxiety or sleep.    ARIPiprazole (ABILIFY) 2 MG tablet Take 2 mg by mouth daily.   busPIRone (BUSPAR) 10 MG tablet Take 10 mg by mouth once.   citalopram  (CELEXA ) 40 MG tablet Take 40 mg by mouth daily.   ferrous gluconate (FERGON) 324 MG tablet Take 324 mg by mouth daily with breakfast.   gabapentin  (NEURONTIN ) 400 MG capsule Take 400 mg by mouth See admin instructions. 2 capsules every morning, 1 capsule every lunch, and 2 capsules at bedtime   hydrochlorothiazide (HYDRODIURIL) 25 MG tablet Take 25 mg by mouth daily.   LANTUS  SOLOSTAR 100 UNIT/ML Solostar Pen Inject 5 Units into the skin at bedtime.   metoprolol  (LOPRESSOR ) 50 MG tablet Take 50 mg by mouth 2 (two) times daily.   norgestimate -ethinyl estradiol  (ORTHO-CYCLEN,SPRINTEC,PREVIFEM) 0.25-35 MG-MCG tablet Take 1 tablet by mouth daily.   nystatin (MYCOSTATIN/NYSTOP) 100000 UNIT/GM  POWD Apply 100,000 Units topically 2 (two) times daily.   omeprazole (PRILOSEC) 40 MG capsule Take 40 mg by mouth daily.   predniSONE  (DELTASONE ) 20 MG tablet Take 1 tablet (20 mg total) by mouth daily with breakfast.   predniSONE  (DELTASONE ) 20 MG tablet Take 2 tablets (40 mg total) by mouth daily. (Patient not taking: Reported on 06/27/2019)   No facility-administered encounter medications on file  as of 05/05/2024.    Social History: Social History   Tobacco Use   Smoking status: Every Day    Current packs/day: 0.00    Average packs/day: 0.5 packs/day for 20.0 years (10.0 ttl pk-yrs)    Types: Cigarettes    Start date: 08/08/1993    Last attempt to quit: 08/08/2013    Years since quitting: 10.7   Smokeless tobacco: Never  Substance Use Topics   Alcohol use: No   Drug use: No    Family Medical History: Family History  Problem Relation Age of Onset   Hypothyroidism Mother    Melanoma Mother    CAD Father     Physical Examination: There were no vitals filed for this visit.  General: Patient is well developed, well nourished, calm, collected, and in no apparent distress. Attention to examination is appropriate.  Respiratory: Patient is breathing without any difficulty.   NEUROLOGICAL:     Awake, alert, oriented to person, place, and time.  Speech is clear and fluent. Fund of knowledge is appropriate.   Cranial Nerves: Pupils equal round and reactive to light.  Facial tone is symmetric.    *** ROM of cervical spine *** pain *** posterior cervical tenderness. *** tenderness in bilateral trapezial region.   *** ROM of lumbar spine *** pain *** posterior lumbar tenderness.   No abnormal lesions on exposed skin.   Strength: Side Biceps Triceps Deltoid Interossei Grip Wrist Ext. Wrist Flex.  R 5 5 5 5 5 5 5   L 5 5 5 5 5 5 5    Side Iliopsoas Quads Hamstring PF DF EHL  R 5 5 5 5 5 5   L 5 5 5 5 5 5    Reflexes are ***2+ and symmetric at the biceps, brachioradialis, patella and achilles.   Hoffman's is absent.  Clonus is not present.   Bilateral upper and lower extremity sensation is intact to light touch.     Gait is normal.   ***No difficulty with tandem gait.    Medical Decision Making  Imaging: Nothing recent.   Assessment and Plan: [name] is a pleasant 53 y.o. female has ***  Treatment options discussed with patient and following plan made:   -  Order for physical therapy for *** spine ***. Patient to call to schedule appointment. *** - Continue current medications including ***. Reviewed dosing and side effects.  - Prescription for ***. Reviewed dosing and side effects. Take with food.  - Prescription for *** to take prn muscle spasms. Reviewed dosing and side effects. Discussed this can cause drowsiness.  - MRI of *** to further evaluate *** radiculopathy. No improvement time or medications (***).  - Referral to PMR at [facility] to discuss possible *** injections.  - Will schedule phone visit to review MRI results once I get them back.   I spent a total of *** minutes in face-to-face and non-face-to-face activities related to this patient's care today including review of outside records, review of imaging, review of symptoms, physical exam, discussion of differential diagnosis, discussion of treatment options, and documentation.  Thank you for involving me in the care of this patient.   Lucetta Russel PA-C Dept. of Neurosurgery

## 2024-05-05 ENCOUNTER — Ambulatory Visit: Admitting: Orthopedic Surgery

## 2024-05-10 LAB — COLOGUARD: COLOGUARD: NEGATIVE

## 2024-05-20 NOTE — Progress Notes (Unsigned)
 Referring Physician:  Kandis Stefano Iles, MD [address] [place],  [place] [address]  Primary Physician:  Kandis Stefano Iles, MD  History of Present Illness: 05/20/2024*** Gabrielle Sampson has a history of HTN, reactive airway disease, IBS, pancreatitis, GERD, DM with neuropathy, depression, hyperlipidemia, obesity, RLS.   Previous lumbar MRI In 2022 showed severe spinal stenosis L4-L5.     Low back pain  Duration: *** Location: *** Quality: *** Severity: ***  Precipitating: aggravated by *** Modifying factors: made better by *** Weakness: none Timing: ***  She smokes ***.   Bowel/Bladder Dysfunction: none  Conservative measures:  Physical therapy: has not participated in PT  Multimodal medical therapy including regular antiinflammatories: Flexeril, Oxycodone, Lidocaine  patches, Tylenol , Cymbalta, Lyrica  Injections:  L4-L5 IL [facility]/22 [facility] L4-L5 [facility]  Past Surgery: none  Stephan Draughn has ***no symptoms of cervical myelopathy.  The symptoms are causing a significant impact on the patient's life.   Review of Systems:  A 10 point review of systems is negative, except for the pertinent positives and negatives detailed in the HPI.  Past Medical History: Past Medical History:  Diagnosis Date   Anxiety    Diabetes (HCC)    Fatigue    HBP (high blood pressure)    Morbid obesity (HCC)    Neuropathy    Osteoarthritis    Pancreatitis    Reflux     Past Surgical History: Past Surgical History:  Procedure Laterality Date   ABSCESS DRAINAGE     MRSA from abdominal wound   ANAL FISSURE REPAIR     CARDIAC CATHETERIZATION     CESAREAN SECTION  1996 AND 1999   ESOPHAGOGASTRODUODENOSCOPY (EGD) WITH PROPOFOL  N/A 10/25/2015   Procedure: ESOPHAGOGASTRODUODENOSCOPY (EGD) WITH PROPOFOL ;  Surgeon: Lamar ONEIDA Holmes, MD;  Location: [facility] ENDOSCOPY;  Service: Endoscopy;  Laterality: N/A;   GALLBLADDER SURGERY  1996   KNEE  SURGERY Right 2012   TONSILLECTOMY AND ADENOIDECTOMY     TUBAL LIGATION      Allergies: Allergies as of 05/26/2024 - Review Complete 06/29/2023  Allergen Reaction Noted   Lisinopril Swelling and Anaphylaxis 09/28/2015   Niacin Rash 01/16/2009   Sulfa antibiotics Hives 06/14/2015   Betadine [povidone iodine] Hives 08/19/2013   Codeine phosphate Nausea And Vomiting 01/19/2007   Empagliflozin Other (See Comments) 02/15/2021   Iodine Hives 08/18/2013   Morphine and codeine  08/18/2013    Medications: Outpatient Encounter Medications as of 05/26/2024  Medication Sig   ALPRAZolam  (XANAX ) 0.5 MG tablet Take 0.5 mg by mouth 2 (two) times daily as needed for anxiety or sleep.    ARIPiprazole (ABILIFY) 2 MG tablet Take 2 mg by mouth daily.   busPIRone (BUSPAR) 10 MG tablet Take 10 mg by mouth once.   citalopram  (CELEXA ) 40 MG tablet Take 40 mg by mouth daily.   ferrous gluconate (FERGON) 324 MG tablet Take 324 mg by mouth daily with breakfast.   gabapentin  (NEURONTIN ) 400 MG capsule Take 400 mg by mouth See admin instructions. 2 capsules every morning, 1 capsule every lunch, and 2 capsules at bedtime   hydrochlorothiazide (HYDRODIURIL) 25 MG tablet Take 25 mg by mouth daily.   LANTUS  SOLOSTAR 100 UNIT/ML Solostar Pen Inject 5 Units into the skin at bedtime.   metoprolol  (LOPRESSOR ) 50 MG tablet Take 50 mg by mouth 2 (two) times daily.   norgestimate -ethinyl estradiol  (ORTHO-CYCLEN,SPRINTEC,PREVIFEM) 0.25-35 MG-MCG tablet Take 1 tablet by mouth daily.   nystatin (MYCOSTATIN/NYSTOP) 100000 UNIT/GM  POWD Apply 100,000 Units topically 2 (two) times daily.   omeprazole (PRILOSEC) 40 MG capsule Take 40 mg by mouth daily.   predniSONE  (DELTASONE ) 20 MG tablet Take 1 tablet (20 mg total) by mouth daily with breakfast.   predniSONE  (DELTASONE ) 20 MG tablet Take 2 tablets (40 mg total) by mouth daily. (Patient not taking: Reported on 06/27/2019)   No facility-administered encounter medications on file  as of 05/26/2024.    Social History: Social History   Tobacco Use   Smoking status: Every Day    Current packs/day: 0.00    Average packs/day: 0.5 packs/day for 20.0 years (10.0 ttl pk-yrs)    Types: Cigarettes    Start date: 08/08/1993    Last attempt to quit: 08/08/2013    Years since quitting: 10.7   Smokeless tobacco: Never  Substance Use Topics   Alcohol use: No   Drug use: No    Family Medical History: Family History  Problem Relation Age of Onset   Hypothyroidism Mother    Melanoma Mother    CAD Father     Physical Examination: There were no vitals filed for this visit.  General: Patient is well developed, well nourished, calm, collected, and in no apparent distress. Attention to examination is appropriate.  Respiratory: Patient is breathing without any difficulty.   NEUROLOGICAL:     Awake, alert, oriented to person, place, and time.  Speech is clear and fluent. Fund of knowledge is appropriate.   Cranial Nerves: Pupils equal round and reactive to light.  Facial tone is symmetric.    *** ROM of cervical spine *** pain *** posterior cervical tenderness. *** tenderness in bilateral trapezial region.   *** ROM of lumbar spine *** pain *** posterior lumbar tenderness.   No abnormal lesions on exposed skin.   Strength: Side Biceps Triceps Deltoid Interossei Grip Wrist Ext. Wrist Flex.  R 5 5 5 5 5 5 5   L 5 5 5 5 5 5 5    Side Iliopsoas Quads Hamstring PF DF EHL  R 5 5 5 5 5 5   L 5 5 5 5 5 5    Reflexes are ***2+ and symmetric at the biceps, brachioradialis, patella and achilles.   Hoffman's is absent.  Clonus is not present.   Bilateral upper and lower extremity sensation is intact to light touch.     Gait is normal.   ***No difficulty with tandem gait.    Medical Decision Making  Imaging: Nothing recent.   Assessment and Plan: Gabrielle Sampson is a pleasant 53 y.o. female has ***  Treatment options discussed with patient and following plan made:   -  Order for physical therapy for *** spine ***. Patient to call to schedule appointment. *** - Continue current medications including ***. Reviewed dosing and side effects.  - Prescription for ***. Reviewed dosing and side effects. Take with food.  - Prescription for *** to take prn muscle spasms. Reviewed dosing and side effects. Discussed this can cause drowsiness.  - MRI of *** to further evaluate *** radiculopathy. No improvement time or medications (***).  - Referral to PMR at [facility] to discuss possible *** injections.  - Will schedule phone visit to review MRI results once I get them back.   I spent a total of *** minutes in face-to-face and non-face-to-face activities related to this patient's care today including review of outside records, review of imaging, review of symptoms, physical exam, discussion of differential diagnosis, discussion of treatment options, and documentation.  Thank you for involving me in the care of this patient.   Glade Boys PA-C Dept. of Neurosurgery

## 2024-05-26 ENCOUNTER — Encounter: Payer: Self-pay | Admitting: Orthopedic Surgery

## 2024-05-26 ENCOUNTER — Ambulatory Visit: Admitting: Orthopedic Surgery

## 2024-05-26 VITALS — BP 118/62 | Ht 65.0 in | Wt 263.5 lb

## 2024-05-26 DIAGNOSIS — M48062 Spinal stenosis, lumbar region with neurogenic claudication: Secondary | ICD-10-CM

## 2024-05-26 DIAGNOSIS — M79605 Pain in left leg: Secondary | ICD-10-CM

## 2024-05-26 DIAGNOSIS — M545 Low back pain, unspecified: Secondary | ICD-10-CM | POA: Diagnosis not present

## 2024-05-26 DIAGNOSIS — M5416 Radiculopathy, lumbar region: Secondary | ICD-10-CM

## 2024-05-26 DIAGNOSIS — M47816 Spondylosis without myelopathy or radiculopathy, lumbar region: Secondary | ICD-10-CM

## 2024-05-26 DIAGNOSIS — M48061 Spinal stenosis, lumbar region without neurogenic claudication: Secondary | ICD-10-CM

## 2024-05-26 NOTE — Patient Instructions (Signed)
 It was so nice to see you today. Thank you so much for coming in.    Your MRI from 2022 showed spinal stenosis (pressure on the spinal cord at L4-L5) along with wear and tear in your back.   I want to get an MRI of your lower back to look into things further. We will get this approved through your insurance and [facility] will call you to schedule the appointment. Ask about your patient responsibility. You do not need to pay this prior to getting MRI, they can bill you.   [facility] (building with the white pillars) is located off of [address]. The address is [address] [place], [place] [address].    After you have the MRI, it can take 14-28 days for me to get the results back. If I don't hear anything after 14 days, then we will call them to get your imaging red.   Once I have the results, we will call you to schedule a follow up MyChart visit with me to review them.   I want you to see [facility] here in [place] to discuss medical management of your pain. They should call you to schedule an appointment or you can call them at [phone].   Please do not hesitate to call if you have any questions or concerns. You can also message me in MyChart.   Glade Boys PA-C [phone]     The physicians and staff at [facility] at [facility] are committed to providing excellent care. You may receive a survey asking for feedback about your experience at our office. We value you your feedback and appreciate you taking the time to to fill it out. The [facility] leadership team is also available to discuss your experience in person, feel free to contact us  [phone].

## 2024-05-31 ENCOUNTER — Ambulatory Visit
Admission: RE | Admit: 2024-05-31 | Discharge: 2024-05-31 | Disposition: A | Discharge status: Home / Self Care | Source: Ambulatory Visit | Attending: Orthopedic Surgery | Admitting: Orthopedic Surgery

## 2024-05-31 DIAGNOSIS — M5416 Radiculopathy, lumbar region: Secondary | ICD-10-CM | POA: Insufficient documentation

## 2024-05-31 DIAGNOSIS — M48062 Spinal stenosis, lumbar region with neurogenic claudication: Secondary | ICD-10-CM | POA: Insufficient documentation

## 2024-05-31 DIAGNOSIS — M47816 Spondylosis without myelopathy or radiculopathy, lumbar region: Secondary | ICD-10-CM | POA: Insufficient documentation

## 2024-06-01 NOTE — Progress Notes (Unsigned)
 Virtual Visit- Progress Note: Referring Physician:  Eliazar Hart LABOR, MD [address] [place],  [place] [address]  Primary Physician:  Eliazar Hart LABOR, MD  This visit was performed via Mychart.  Patient location: home Provider location: office  I spent a total of 30 minutes non-face-to-face activities for this visit on the date of this encounter including review of current clinical condition and response to treatment.    Patient has given verbal consent to this telephone visits and we reviewed the limitations of a telephone visit. Patient wishes to proceed.    Chief Complaint:  review lumbar imaging.   History of Present Illness: Gabrielle Sampson is a 53 y.o. female has a history of   HTN, reactive airway disease, IBS, pancreatitis, GERD, DM with neuropathy, depression, hyperlipidemia, obesity, RLS.    Previous lumbar MRI In 2022 showed severe spinal stenosis L4-L5 and right foraminal disc L5-S1.   She is about the same. She continues with constant LBP with intermittent left lateral leg pain to her foot. She has a lot of pain in left calf. No right leg pain, but she notes chronic weakness in right foot x years. She has intermittent numbness and tingling in both legs. Worse with standing and walking. Grocery cart helps. Some improvement with rest.    She is using lidocaine  patches and taking flexeril and lyrica.   Her last HgbA1c on 02/22/24 was 7.    No relief with previous lumbar injections in 2022 and 2023.    She smokes 1/2 ppd x 30 years.    Bowel/Bladder Dysfunction: none   Conservative measures:  Physical therapy: has not participated in PT  Multimodal medical therapy including regular antiinflammatories: Flexeril, Oxycodone, Lidocaine  patches, Tylenol , Cymbalta, Lyrica  Injections:  L4-L5 IL [facility] 02/17/22 [facility] L4-L5 IL [facility] 10/31/21 [facility]   Past Surgery: none   Exam: General: Patient is well developed, well nourished, calm, collected, and in no apparent  distress. Attention to examination is appropriate.  Respiratory: Patient is breathing without any difficulty.    Awake, alert, oriented to person, place, and time.  Speech is clear and fluent. Fund of knowledge is appropriate.    Imaging: Lumbar MRI dated 05/31/24:  FINDINGS: Segmentation:  Standard.   Alignment:  Physiologic.   Vertebrae: Hemangiomas within the L1 and L3 vertebral bodies. Normal bone marrow signal otherwise.   Conus medullaris and cauda equina: Conus extends to the L1-2 level. The conus medullaris is unremarkable. There is a central crowding of the cauda equina at L4-5 and there is mild tangling of the nerve roots.   Paraspinal and other soft tissues: Negative.   Disc levels:   L1-2: Normal.   L2-3: Normal.   L3-4: Mild disc bulging and facet hypertrophic changes. No significant spinal canal or neural foraminal stenosis.   L4-5: Mild disc bulging and moderate bilateral facet arthrosis with prominent bony spurring and the synovial cyst on the left, contributing to severe thecal stenosis and likely impingement of the left L5 nerve in the lateral recess. There is also moderate right lateral recess stenosis. The neural foramina are patent.   L5-S1: Normal.   IMPRESSION: 1. Chronic degenerative disc disease and facet arthrosis at L4-5, with prominent spurring and a small synovial cyst on the left, contributing to severe thecal stenosis and bilateral lateral recess stenosis, with likely impingement of the left L5 nerve.     Electronically Signed   By: Evalene Coho M.D.   On: 06/01/2024 07:15  I have personally  reviewed the images and agree with the above interpretation.  MRI imaging was shared during visit and reviewed with patient.  Assessment and Plan: Gabrielle Sampson continues with constant LBP with intermittent left lateral leg pain to her foot. She has a lot of pain in left calf. No right leg pain, but she notes chronic weakness in right foot x  years. She has intermittent numbness and tingling in both legs.   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 Also with mild right foraminal stenosis L5-S1.   Primary pain generator is likely L4-L5.    Treatment options discussed with patient and following plan made:   - Referral to PT for lumbar spine. Orders to [facility] in [place].  - Referral to pain management at [facility] to consider lumbar injections. She would like to see Dr. Tanya.  - She would still like to see [facility] to discuss further medical  management of pain.  - Weight loss discussed and encouraged. She is working on this and has lost 60+ pounds.  - Of note, last HgbA1c on 02/22/24 was 7.  - Continue on prn flexeril and lyrica from PCP.  - If no improvement with above, may need to consider surgery options.    Glade Boys PA-C Neurosurgery

## 2024-06-02 ENCOUNTER — Telehealth: Admitting: Orthopedic Surgery

## 2024-06-02 ENCOUNTER — Encounter: Payer: Self-pay | Admitting: Orthopedic Surgery

## 2024-06-02 DIAGNOSIS — M5416 Radiculopathy, lumbar region: Secondary | ICD-10-CM

## 2024-06-02 DIAGNOSIS — M48062 Spinal stenosis, lumbar region with neurogenic claudication: Secondary | ICD-10-CM | POA: Diagnosis not present

## 2024-06-02 DIAGNOSIS — M4726 Other spondylosis with radiculopathy, lumbar region: Secondary | ICD-10-CM

## 2024-06-02 DIAGNOSIS — M47816 Spondylosis without myelopathy or radiculopathy, lumbar region: Secondary | ICD-10-CM

## 2024-06-03 ENCOUNTER — Encounter: Payer: Self-pay | Admitting: Orthopedic Surgery

## 2024-07-05 DIAGNOSIS — R238 Other skin changes: Secondary | ICD-10-CM | POA: Insufficient documentation

## 2024-07-05 DIAGNOSIS — L509 Urticaria, unspecified: Secondary | ICD-10-CM | POA: Insufficient documentation

## 2024-07-05 DIAGNOSIS — F4320 Adjustment disorder, unspecified: Secondary | ICD-10-CM | POA: Insufficient documentation

## 2024-07-05 DIAGNOSIS — N898 Other specified noninflammatory disorders of vagina: Secondary | ICD-10-CM | POA: Insufficient documentation

## 2024-07-05 DIAGNOSIS — N939 Abnormal uterine and vaginal bleeding, unspecified: Secondary | ICD-10-CM | POA: Insufficient documentation

## 2024-07-05 DIAGNOSIS — J019 Acute sinusitis, unspecified: Secondary | ICD-10-CM | POA: Insufficient documentation

## 2024-07-05 DIAGNOSIS — E781 Pure hyperglyceridemia: Secondary | ICD-10-CM | POA: Insufficient documentation

## 2024-07-05 NOTE — Progress Notes (Unsigned)
 PROVIDER NOTE: Interpretation of information contained herein should be left to medically-trained personnel. Specific patient instructions are provided elsewhere under Patient Instructions section of medical record. This document was created in part using AI and STT-dictation technology, any transcriptional errors that may result from this process are unintentional.  Patient: Gabrielle Sampson  Service: E/M Encounter  Provider: Eric DELENA Como, MD  DOB: [birth year]  Delivery: Face-to-face  Specialty: Interventional Pain Management  MRN: [id number]  Setting: Ambulatory outpatient facility  Specialty designation: 09  Type: New Patient  Location: Outpatient office facility  PCP: Eliazar Hart DELENA, MD  DOS: 07/06/2024    Referring Prov.: Hilma Hastings, PA-C   Primary Reason(s) for Visit: Encounter for initial evaluation of one or more chronic problems (new to examiner) potentially causing chronic pain, and posing a threat to normal musculoskeletal function. (Level of risk: High) CC: No chief complaint on file.  HPI  Ms. Bugaj is a 53 y.o. year old, female patient, who comes for the first time to our practice referred by Hilma Hastings, PA-C for our initial evaluation of her chronic pain. She has Benign neoplasm of skin; Anxiety state; DEPRESSION; Essential hypertension; IRRITABLE BOWEL SYNDROME; PANCREATITIS; Hypertrophic and atrophic condition of skin; URTICARIA; Disturbance in sleep behavior; ABDOMINAL PAIN, EPIGASTRIC; Angioedema; Dysphagia; Benign essential HTN; Diabetes (HCC); Obesity; Current every day smoker; Elevated hemoglobin A1c; Anal skin tag; Arthritis; At risk for polypharmacy; Bilateral calf pain; Candidiasis of vagina; Cellulitis; Chronic pain of right knee; Cough; Diarrhea; Edema; Encounter for repeat Pap smear due to previous insufficient cervical cells; Endometritis; External hemorrhoid; Fatigue; GERD without esophagitis; Hearing loss; Herpes labialis; History of acute pancreatitis;  History of MRSA infection; HSV-1 infection; Hyperlipidemia; Internal nasal lesion; Intertrigo; Irregular periods; Left ovarian cyst; Lipoma of left upper extremity; Lower abdominal pain; Menorrhagia; Neck pain on left side; Neoplasm of uncertain behavior; Neuropathy due to type 2 diabetes mellitus (HCC); Pain in pelvis; Foot pain, right; Painful skin lesion; Restless leg syndrome; Reduced libido; Reactive airway disease without complication; Spinal stenosis of lumbar region with neurogenic claudication; Tobacco use disorder; Trochanteric bursitis of both hips; Vaginal discharge; and Varicose vein of leg on their problem list. Today she comes in for evaluation of her No chief complaint on file.  Pain Assessment: Location:     Radiating:   Onset:   Duration:   Quality:   Severity:  /10 (subjective, self-reported pain score)  Effect on ADL:   Timing:   Modifying factors:   BP:    HR:    Onset and Duration: {Hx; Onset and Duration:210120511} Cause of pain: {Hx; Cause:210120521} Severity: {Pain Severity:210120502} Timing: {Symptoms; Timing:210120501} Aggravating Factors: {Causes; Aggravating pain factors:210120507} Alleviating Factors: {Causes; Alleviating Factors:210120500} Associated Problems: {Hx; Associated problems:210120515} Quality of Pain: {Hx; Symptom quality or Descriptor:210120531} Previous Examinations or Tests: {Hx; Previous examinations or test:210120529} Previous Treatments: {Hx; Previous Treatment:210120503}  [name] is being evaluated for possible interventional pain management therapies for the treatment of her chronic pain.  Discussed the use of AI scribe software for clinical note transcription with the patient, who gave verbal consent to proceed.  History of Present Illness            [name] has been informed that this initial visit was an evaluation only.  On the follow up appointment I will go over the results, including ordered tests and available  interventional therapies. At that time she will have the opportunity to decide whether to proceed with offered therapies or not. In the event that Ms. Sampson  prefers avoiding interventional options, this will conclude our involvement in the case.  Medication management recommendations may be provided upon request.  Patient informed that diagnostic tests may be ordered to assist in identifying underlying causes, narrow the list of differential diagnoses and aid in determining candidacy for (or contraindications to) planned therapeutic interventions.  Historic Controlled Substance Pharmacotherapy Review PMP and historical list of controlled substances: Pregabalin (Lyrica) 225 mg capsule, 1 cap p.o. twice daily (60/month) (last filled on 05/26/2024); oxycodone IR 10 mg tablet, 1 tab p.o. 3 times daily (# 16) (last filled on 11/25/2022); oxycodone IR 5 mg tablet, 1 tab p.o. 3 times daily (# 16) (last filled on 08/22/2022) Most recently prescribed controlled substance(s): Opioid Analgesic: None MME/day: 0 mg/day  Historical Monitoring: The patient  reports no history of drug use. List of prior UDS Testing: No results found for: MDMA, COCAINSCRNUR, PCPSCRNUR, PCPQUANT, CANNABQUANT, THCU, ETH, CBDTHCR, D8THCCBX, D9THCCBX Historical Background Evaluation: [place] PMP: PDMP reviewed during this encounter. Review of the past 55-months conducted.             PMP NARX Score Report:  Narcotic: 190 Sedative: 370 Stimulant: 000 [place] Department of public safety, offender search: Engineer, mining Information) Non-contributory Risk Assessment Profile: Aberrant behavior: None observed or detected today Risk factors for fatal opioid overdose: None identified today PMP NARX Overdose Risk Score: 300 Fatal overdose hazard ratio (HR): Calculation deferred Non-fatal overdose hazard ratio (HR): Calculation deferred Risk of opioid abuse or dependence: 0.7-3.0% with doses <= 36 MME/day and 6.1-26% with doses >= 120  MME/day. Substance use disorder (SUD) risk level: See below Personal History of Substance Abuse (SUD-Substance use disorder):  Alcohol:    Illegal Drugs:    Rx Drugs:    ORT Risk Level calculation:    ORT Scoring interpretation table:  Score <3 = Low Risk for SUD  Score between 4-7 = Moderate Risk for SUD  Score >8 = High Risk for Opioid Abuse   PHQ-2 Depression Scale:  Total score:    PHQ-2 Scoring interpretation table: (Score and probability of major depressive disorder)  Score 0 = No depression  Score 1 = 15.4% Probability  Score 2 = 21.1% Probability  Score 3 = 38.4% Probability  Score 4 = 45.5% Probability  Score 5 = 56.4% Probability  Score 6 = 78.6% Probability   PHQ-9 Depression Scale:  Total score:    PHQ-9 Scoring interpretation table:  Score 0-4 = No depression  Score 5-9 = Mild depression  Score 10-14 = Moderate depression  Score 15-19 = Moderately severe depression  Score 20-27 = Severe depression (2.4 times higher risk of SUD and 2.89 times higher risk of overuse)   Pharmacologic Plan: As per protocol, I have not taken over any controlled substance management, pending the results of ordered tests and/or consults.            Initial impression: Pending review of available data and ordered tests.  Meds   Current Outpatient Medications:    amitriptyline (ELAVIL) 50 MG tablet, Take 50 mg by mouth at bedtime., Disp: , Rfl:    atorvastatin (LIPITOR) 80 MG tablet, Take 80 mg by mouth daily., Disp: , Rfl:    cyclobenzaprine (FLEXERIL) 10 MG tablet, Take 10 mg by mouth 2 (two) times daily as needed., Disp: , Rfl:    escitalopram (LEXAPRO) 20 MG tablet, Take 20 mg by mouth daily., Disp: , Rfl:    famotidine  (PEPCID ) 20 MG tablet, Take 20 mg by mouth 2 (two) times daily.,  Disp: , Rfl:    hydrochlorothiazide (HYDRODIURIL) 25 MG tablet, Take 25 mg by mouth daily., Disp: , Rfl:    lidocaine  (XYLOCAINE ) 5 % ointment, 2 (two) times daily as needed., Disp: , Rfl:     losartan (COZAAR) 25 MG tablet, Take 25 mg by mouth daily., Disp: , Rfl:    metFORMIN (GLUCOPHAGE-XR) 500 MG 24 hr tablet, Take 1,000 mg by mouth 2 (two) times daily., Disp: , Rfl:    OZEMPIC, 1 MG/DOSE, 4 MG/3ML SOPN, Inject 1 mg into the skin., Disp: , Rfl:    pregabalin (LYRICA) 225 MG capsule, Take 225 mg by mouth 2 (two) times daily., Disp: , Rfl:   Imaging Review  Lumbosacral Imaging: Lumbar MR wo contrast: Results for orders placed during the hospital encounter of 05/31/24 MR LUMBAR SPINE WO CONTRAST  Narrative CLINICAL DATA:  Lumbar radiculopathy, symptoms persist with > 6 wks treatment  EXAM: MRI LUMBAR SPINE WITHOUT CONTRAST  TECHNIQUE: Multiplanar, multisequence MR imaging of the lumbar spine was performed. No intravenous contrast was administered.  COMPARISON:  MRI of the lumbar spine dated February 18, 2021.  FINDINGS: Segmentation:  Standard.  Alignment:  Physiologic.  Vertebrae: Hemangiomas within the L1 and L3 vertebral bodies. Normal bone marrow signal otherwise.  Conus medullaris and cauda equina: Conus extends to the L1-2 level. The conus medullaris is unremarkable. There is a central crowding of the cauda equina at L4-5 and there is mild tangling of the nerve roots.  Paraspinal and other soft tissues: Negative.  Disc levels:  L1-2: Normal.  L2-3: Normal.  L3-4: Mild disc bulging and facet hypertrophic changes. No significant spinal canal or neural foraminal stenosis.  L4-5: Mild disc bulging and moderate bilateral facet arthrosis with prominent bony spurring and the synovial cyst on the left, contributing to severe thecal stenosis and likely impingement of the left L5 nerve in the lateral recess. There is also moderate right lateral recess stenosis. The neural foramina are patent.  L5-S1: Normal.  IMPRESSION: 1. Chronic degenerative disc disease and facet arthrosis at L4-5, with prominent spurring and a small synovial cyst on the  left, contributing to severe thecal stenosis and bilateral lateral recess stenosis, with likely impingement of the left L5 nerve.   Electronically Signed By: Evalene Coho M.D. On: 06/01/2024 07:15  Knee Imaging: Knee-R MR wo contrast: Results for orders placed during the hospital encounter of 01/01/16 MR Knee Right Wo Contrast  Narrative CLINICAL DATA:  Medial knee pain in weakness over the past 4 months. Prior knee surgery 4 years ago.  EXAM: MRI OF THE RIGHT KNEE WITHOUT CONTRAST  TECHNIQUE: Multiplanar, multisequence MR imaging of the knee was performed. No intravenous contrast was administered.  COMPARISON:  11/24/2009  FINDINGS: MENISCI  Medial meniscus: Very diminutive rim of residual tissue in the midbody and adjacent posterior horn medial meniscus, representing either tear or partial meniscectomy since the prior exam of 11/24/2009.  Lateral meniscus: Mild diminution in size of the posterior horn lateral meniscus compared to previous, again probably from partial meniscectomy, less likely due to an otherwise occult tear.  LIGAMENTS  Cruciates:  Unremarkable  Collaterals:  Unremarkable  CARTILAGE  Patellofemoral: Moderate degenerative chondral thinning and chondral irregularity in the femoral trochlear groove, lateral patellar facet, and posterior patellar ridge. Marginal spurring.  Medial: Moderate degenerative chondral thinning and chondral irregularity with marginal spurring.  Lateral: Moderate degenerative chondral thinning and chondral irregularity with both marginal spurring and articular spurring along the lateral femoral condyle.  Joint:  Small knee  effusion.  Popliteal Fossa:  Unremarkable  Extensor Mechanism:  Unremarkable  Bones: Red marrow noted. This is mildly prominent for age and may be a response to anemia.  IMPRESSION: 1. Diminution in size of the midbody and posterior horn of the medial meniscus, probably from partial  meniscectomy, less likely tear. 2. Similar appearance of the mild reduction in size of the posterior horn lateral meniscus, favor partial meniscectomy, less likely tear, correlate with prior operative intervention. 3. Moderate degenerative chondral thinning and tricompartmental chondral irregularity and spurring. 4. Small knee effusion. 5. Mildly prominent red marrow for age, probably a response to anemia.   Electronically Signed By: Ryan Salvage M.D. On: 01/01/2016 09:50  Complexity Note: Imaging results reviewed.                         ROS  Cardiovascular: {Hx; Cardiovascular History:210120525} Pulmonary or Respiratory: {Hx; Pumonary and/or Respiratory History:210120523} Neurological: {Hx; Neurological:210120504} Psychological-Psychiatric: {Hx; Psychological-Psychiatric History:210120512} Gastrointestinal: {Hx; Gastrointestinal:210120527} Genitourinary: {Hx; Genitourinary:210120506} Hematological: {Hx; Hematological:210120510} Endocrine: {Hx; Endocrine history:210120509} Rheumatologic: {Hx; Rheumatological:210120530} Musculoskeletal: {Hx; Musculoskeletal:210120528} Work History: {Hx; Work history:210120514}  Allergies  Ms. Storlie is allergic to lisinopril, niacin, sulfa antibiotics, betadine [povidone iodine], codeine phosphate, empagliflozin, iodine, and morphine and codeine.  Laboratory Chemistry Profile   Renal Lab Results  Component Value Date   BUN 13 12/22/2016   CREATININE 0.66 12/22/2016   GFRAA >60 12/22/2016   GFRNONAA >60 12/22/2016   PROTEINUR NEGATIVE 12/22/2016     Electrolytes Lab Results  Component Value Date   NA 133 (L) 12/22/2016   K 3.8 12/22/2016   CL 96 (L) 12/22/2016   CALCIUM 8.9 12/22/2016     Hepatic Lab Results  Component Value Date   AST 46 (H) 12/22/2016   ALT 24 12/22/2016   ALBUMIN 3.6 12/22/2016   ALKPHOS 127 (H) 12/22/2016   AMYLASE 47 03/28/2011   LIPASE 43 12/22/2016     ID Lab Results  Component Value Date    SARSCOV2NAA Not Detected 12/07/2019   MRSAPCR NEGATIVE 09/28/2015   PREGTESTUR NEGATIVE 10/25/2015     Bone No results found for: VD25OH, VD125OH2TOT, CI6874NY7, CI7874NY7, 25OHVITD1, 25OHVITD2, 25OHVITD3, TESTOFREE, TESTOSTERONE   Endocrine Lab Results  Component Value Date   GLUCOSE 278 (H) 12/22/2016   GLUCOSEU 50 (A) 12/22/2016     Neuropathy No results found for: VITAMINB12, FOLATE, HGBA1C, HIV   CNS No results found for: COLORCSF, APPEARCSF, RBCCOUNTCSF, WBCCSF, POLYSCSF, LYMPHSCSF, EOSCSF, PROTEINCSF, GLUCCSF, JCVIRUS, CSFOLI, IGGCSF, LABACHR, ACETBL   Inflammation (CRP: Acute  ESR: Chronic) No results found for: CRP, ESRSEDRATE, LATICACIDVEN   Rheumatology No results found for: RF, ANA, LABURIC, URICUR, LYMEIGGIGMAB, LYMEABIGMQN, HLAB27   Coagulation Lab Results  Component Value Date   INR 1.1 02/07/2015   LABPROT 14.3 02/07/2015   PLT 249 12/22/2016     Cardiovascular Lab Results  Component Value Date   TROPONINI <0.03 07/13/2016   HGB 11.5 (L) 12/22/2016   HCT 33.8 (L) 12/22/2016     Screening Lab Results  Component Value Date   SARSCOV2NAA Not Detected 12/07/2019   MRSAPCR NEGATIVE 09/28/2015   PREGTESTUR NEGATIVE 10/25/2015     Cancer No results found for: CEA, CA125, LABCA2   Allergens No results found for: ALMOND, APPLE, ASPARAGUS, AVOCADO, BANANA, BARLEY, BASIL, BAYLEAF, GREENBEAN, LIMABEAN, WHITEBEAN, BEEFIGE, REDBEET, BLUEBERRY, BROCCOLI, CABBAGE, MELON, CARROT, CASEIN, CASHEWNUT, CAULIFLOWER, CELERY     Note: Lab results reviewed.  PFSH  Drug: Ms. Schlottman  reports no history of drug  use. Alcohol:  reports no history of alcohol use. Tobacco:  reports that she has been smoking cigarettes. She started smoking about 30 years ago. She has a 10 pack-year smoking history. She has never used smokeless tobacco. Medical:  has a  past medical history of Anxiety, Diabetes (HCC), Fatigue, HBP (high blood pressure), Morbid obesity (HCC), Neuropathy, Osteoarthritis, Pancreatitis, and Reflux. Family: family history includes CAD in her father; Hypothyroidism in her mother; Melanoma in her mother.  Past Surgical History:  Procedure Laterality Date   ABSCESS DRAINAGE     MRSA from abdominal wound   ANAL FISSURE REPAIR     CARDIAC CATHETERIZATION     CESAREAN SECTION  1996 AND 1999   ESOPHAGOGASTRODUODENOSCOPY (EGD) WITH PROPOFOL  N/A 10/25/2015   Procedure: ESOPHAGOGASTRODUODENOSCOPY (EGD) WITH PROPOFOL ;  Surgeon: [facility] ONEIDA Holmes, MD;  Location: [facility] ENDOSCOPY;  Service: Endoscopy;  Laterality: N/A;   GALLBLADDER SURGERY  1996   KNEE SURGERY Right 2012   TONSILLECTOMY AND ADENOIDECTOMY     TUBAL LIGATION     Active Ambulatory Problems    Diagnosis Date Noted   Benign neoplasm of skin 02/22/2007   Anxiety state 04/29/2007   DEPRESSION 04/29/2007   Essential hypertension 04/29/2007   IRRITABLE BOWEL SYNDROME 02/22/2007   PANCREATITIS 05/10/2007   Hypertrophic and atrophic condition of skin 02/22/2007   URTICARIA 05/10/2007   Disturbance in sleep behavior 02/22/2007   ABDOMINAL PAIN, EPIGASTRIC 06/18/2007   Angioedema 09/28/2015   Dysphagia 09/30/2015   Benign essential HTN 09/30/2015   Diabetes (HCC) 09/30/2015   Obesity 09/30/2015   Current every day smoker 06/27/2019   Elevated hemoglobin A1c 06/29/2023   Anal skin tag 08/27/2021   Arthritis 06/29/2023   At risk for polypharmacy 11/25/2022   Bilateral calf pain 02/07/2021   Candidiasis of vagina 06/29/2023   Cellulitis 06/29/2023   Chronic pain of right knee 11/28/2019   Cough 09/03/2020   Diarrhea 01/31/2023   Edema 06/02/2019   Encounter for repeat Pap smear due to previous insufficient cervical cells 01/16/2022   Endometritis 06/29/2023   External hemorrhoid 03/26/2021   Fatigue 06/29/2020   GERD without esophagitis 09/07/2015   Hearing loss  08/22/2022   Herpes labialis 06/29/2023   History of acute pancreatitis 01/26/2020   History of MRSA infection 01/27/2015   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   Hyperlipidemia 06/29/2020   Internal nasal lesion 03/26/2021   Intertrigo 12/09/2019   Irregular periods 06/29/2023   Left ovarian cyst 03/26/2021   Lipoma of left upper extremity 03/07/2020   Lower abdominal pain 06/29/2023   Menorrhagia 06/29/2023   Neck pain on left side 07/16/2022   Neoplasm of uncertain behavior 07/24/2020   Neuropathy due to type 2 diabetes mellitus (HCC) 04/09/2023   Pain in pelvis 06/29/2023   Foot pain, right 03/07/2020   Painful skin lesion 11/25/2022   Restless leg syndrome 02/28/2015   Reduced libido 06/29/2023   Reactive airway disease without complication 07/13/2015   Spinal stenosis of lumbar region with neurogenic claudication 11/04/2019   Tobacco use disorder 05/01/2022   Trochanteric bursitis of both hips 06/23/2015   Vaginal discharge 06/29/2023   Varicose vein of leg 07/16/2016   Resolved Ambulatory Problems    Diagnosis Date Noted   No Resolved Ambulatory Problems   Past Medical History:  Diagnosis Date   Anxiety    HBP (high blood pressure)    Morbid obesity (HCC)    Neuropathy    Osteoarthritis    Pancreatitis    Reflux    Constitutional  Exam  General appearance: Well nourished, well developed, and well hydrated. In no apparent acute distress There were no vitals filed for this visit. BMI Assessment: Estimated body mass index is 43.85 kg/m as calculated from the following:   Height as of 05/26/24: 5' 5 (1.651 m).   Weight as of 05/26/24: 263 lb 8 oz (119.5 kg).  BMI interpretation table: BMI level Category Range association with higher incidence of chronic pain  <18 kg/m2 Underweight   18.5-24.9 kg/m2 Ideal body weight   25-29.9 kg/m2 Overweight Increased incidence by 20%  30-34.9 kg/m2 Obese (Class I) Increased incidence by 68%  35-39.9 kg/m2 Severe obesity (Class II)  Increased incidence by 136%  >40 kg/m2 Extreme obesity (Class III) Increased incidence by 254%   Patient's current BMI Ideal Body weight  There is no height or weight on file to calculate BMI. Patient weight not recorded   BMI Readings from Last 4 Encounters:  05/26/24 43.85 kg/m  08/03/17 51.84 kg/m  04/27/17 54.93 kg/m  12/22/16 55.27 kg/m   Wt Readings from Last 4 Encounters:  05/26/24 263 lb 8 oz (119.5 kg)  08/03/17 (!) 302 lb (137 kg)  04/27/17 (!) 320 lb (145.2 kg)  12/22/16 (!) 322 lb (146.1 kg)    Psych/Mental status: Alert, oriented x 3 (person, place, & time)       Eyes: PERLA Respiratory: No evidence of acute respiratory distress  Assessment  Primary Diagnosis & Pertinent Problem List: There were no encounter diagnoses.  Visit Diagnosis (New problems to examiner): No diagnosis found. Plan of Care (Initial workup plan)  Note: Ms. Silverthorne was reminded that as per protocol, today's visit has been an evaluation only. We have not taken over the patient's controlled substance management.  Problem-specific plan: Assessment and Plan            Lab Orders  No laboratory test(s) ordered today   Imaging Orders  No imaging studies ordered today   Referral Orders  No referral(s) requested today   Procedure Orders    No procedure(s) ordered today   Pharmacotherapy (current): Medications ordered:  No orders of the defined types were placed in this encounter.  Medications administered during this visit: Logen Fowle. Mckesson had no medications administered during this visit.   Analgesic Pharmacotherapy:  Opioid Analgesics: For patients currently taking or requesting to take opioid analgesics, in accordance with [place]  Medical Board Guidelines, we will assess their risks and indications for the use of these substances. After completing our evaluation, we may offer recommendations, but we no longer take patients for medication management. The prescribing  physician will ultimately decide, based on his/her training and level of comfort whether to adopt any of the recommendations, including whether or not to prescribe such medicines.  Membrane stabilizer: To be determined at a later time  Muscle relaxant: To be determined at a later time  NSAID: To be determined at a later time  Other analgesic(s): To be determined at a later time   Interventional management options: Ms. Brien was informed that there is no guarantee that she would be a candidate for interventional therapies. The decision will be based on the results of diagnostic studies, as well as Ms. Jessop's risk profile.  Procedure(s) under consideration:  Pending results of ordered studies     Interventional Therapies  Risk Factors  Considerations  Medical Comorbidities:     Planned  Pending:      Under consideration:   Pending   Completed: (Analgesic benefit)1  None  at this time   Therapeutic  Palliative (PRN) options:   None established   Completed by other providers:   None reported  1(Analgesic benefit): Expressed in percentage (%). (Local anesthetic[LA] +/- sedation  L.A.Local Anesthetic  Steroid benefit  Ongoing benefit)   Provider-requested follow-up: No follow-ups on file.  Future Appointments  Date Time Provider Department Center  07/06/2024  8:00 AM Tanya Glisson, MD [facility] None  08/03/2024  8:30 AM Hilma Hastings, PA-C [facility] None   I discussed the assessment and treatment plan with the patient. The patient was provided an opportunity to ask questions and all were answered. The patient agreed with the plan and demonstrated an understanding of the instructions.  Patient advised to call back or seek an in-person evaluation if the symptoms or condition worsens.  Duration of encounter: *** minutes.  Total time on encounter, as per AMA guidelines included both the face-to-face and non-face-to-face time personally spent by the physician and/or other  qualified health care professional(s) on the day of the encounter (includes time in activities that require the physician or other qualified health care professional and does not include time in activities normally performed by clinical staff). Physician's time may include the following activities when performed: Preparing to see the patient (e.g., pre-charting review of records, searching for previously ordered imaging, lab work, and nerve conduction tests) Review of prior analgesic pharmacotherapies. Reviewing PMP Interpreting ordered tests (e.g., lab work, imaging, nerve conduction tests) Performing post-procedure evaluations, including interpretation of diagnostic procedures Obtaining and/or reviewing separately obtained history Performing a medically appropriate examination and/or evaluation Counseling and educating the patient/family/caregiver Ordering medications, tests, or procedures Referring and communicating with other health care professionals (when not separately reported) Documenting clinical information in the electronic or other health record Independently interpreting results (not separately reported) and communicating results to the patient/ family/caregiver Care coordination (not separately reported)  Note by: Glisson DELENA Tanya, MD (TTS and AI technology used. I apologize for any typographical errors that were not detected and corrected.) Date: 07/06/2024; Time: 7:57 AM

## 2024-07-06 ENCOUNTER — Encounter: Payer: Self-pay | Admitting: Pain Medicine

## 2024-07-06 ENCOUNTER — Ambulatory Visit: Attending: Pain Medicine | Admitting: Pain Medicine

## 2024-07-06 VITALS — BP 123/72 | HR 83 | Temp 97.2°F | Resp 16 | Ht 65.0 in | Wt 265.4 lb

## 2024-07-06 DIAGNOSIS — M899 Disorder of bone, unspecified: Secondary | ICD-10-CM | POA: Diagnosis present

## 2024-07-06 DIAGNOSIS — M5416 Radiculopathy, lumbar region: Secondary | ICD-10-CM | POA: Diagnosis present

## 2024-07-06 DIAGNOSIS — M79605 Pain in left leg: Secondary | ICD-10-CM | POA: Insufficient documentation

## 2024-07-06 DIAGNOSIS — Z79899 Other long term (current) drug therapy: Secondary | ICD-10-CM | POA: Diagnosis present

## 2024-07-06 DIAGNOSIS — M545 Low back pain, unspecified: Secondary | ICD-10-CM | POA: Diagnosis present

## 2024-07-06 DIAGNOSIS — M25551 Pain in right hip: Secondary | ICD-10-CM | POA: Insufficient documentation

## 2024-07-06 DIAGNOSIS — E1140 Type 2 diabetes mellitus with diabetic neuropathy, unspecified: Secondary | ICD-10-CM | POA: Diagnosis present

## 2024-07-06 DIAGNOSIS — M7138 Other bursal cyst, other site: Secondary | ICD-10-CM | POA: Insufficient documentation

## 2024-07-06 DIAGNOSIS — M79671 Pain in right foot: Secondary | ICD-10-CM | POA: Diagnosis present

## 2024-07-06 DIAGNOSIS — R202 Paresthesia of skin: Secondary | ICD-10-CM | POA: Diagnosis present

## 2024-07-06 DIAGNOSIS — M47816 Spondylosis without myelopathy or radiculopathy, lumbar region: Secondary | ICD-10-CM | POA: Diagnosis present

## 2024-07-06 DIAGNOSIS — M25561 Pain in right knee: Secondary | ICD-10-CM | POA: Insufficient documentation

## 2024-07-06 DIAGNOSIS — R937 Abnormal findings on diagnostic imaging of other parts of musculoskeletal system: Secondary | ICD-10-CM | POA: Insufficient documentation

## 2024-07-06 DIAGNOSIS — M25562 Pain in left knee: Secondary | ICD-10-CM | POA: Insufficient documentation

## 2024-07-06 DIAGNOSIS — M4800 Spinal stenosis, site unspecified: Secondary | ICD-10-CM | POA: Diagnosis present

## 2024-07-06 DIAGNOSIS — Z789 Other specified health status: Secondary | ICD-10-CM | POA: Diagnosis present

## 2024-07-06 DIAGNOSIS — M25552 Pain in left hip: Secondary | ICD-10-CM | POA: Insufficient documentation

## 2024-07-06 DIAGNOSIS — M5442 Lumbago with sciatica, left side: Secondary | ICD-10-CM | POA: Insufficient documentation

## 2024-07-06 DIAGNOSIS — Z6841 Body Mass Index (BMI) 40.0 and over, adult: Secondary | ICD-10-CM | POA: Insufficient documentation

## 2024-07-06 DIAGNOSIS — G894 Chronic pain syndrome: Secondary | ICD-10-CM | POA: Insufficient documentation

## 2024-07-06 DIAGNOSIS — R200 Anesthesia of skin: Secondary | ICD-10-CM | POA: Diagnosis present

## 2024-07-06 DIAGNOSIS — E118 Type 2 diabetes mellitus with unspecified complications: Secondary | ICD-10-CM | POA: Insufficient documentation

## 2024-07-06 DIAGNOSIS — M79604 Pain in right leg: Secondary | ICD-10-CM | POA: Diagnosis present

## 2024-07-06 DIAGNOSIS — M48061 Spinal stenosis, lumbar region without neurogenic claudication: Secondary | ICD-10-CM | POA: Insufficient documentation

## 2024-07-06 DIAGNOSIS — M79672 Pain in left foot: Secondary | ICD-10-CM | POA: Diagnosis present

## 2024-07-06 DIAGNOSIS — M5441 Lumbago with sciatica, right side: Secondary | ICD-10-CM | POA: Diagnosis present

## 2024-07-06 DIAGNOSIS — Z9889 Other specified postprocedural states: Secondary | ICD-10-CM | POA: Diagnosis present

## 2024-07-06 DIAGNOSIS — G8929 Other chronic pain: Secondary | ICD-10-CM | POA: Diagnosis present

## 2024-07-06 NOTE — Patient Instructions (Addendum)

## 2024-07-06 NOTE — Progress Notes (Signed)
 Safety precautions to be maintained throughout the outpatient stay will include: orient to surroundings, keep bed in low position, maintain call bell within reach at all times, provide assistance with transfer out of bed and ambulation.

## 2024-07-07 LAB — COMP. METABOLIC PANEL (12)
AST: 17 IU/L (ref 0–40)
Albumin: 4.3 g/dL (ref 3.8–4.9)
Alkaline Phosphatase: 153 IU/L — ABNORMAL HIGH (ref 44–121)
BUN/Creatinine Ratio: 24 — ABNORMAL HIGH (ref 9–23)
BUN: 13 mg/dL (ref 6–24)
Bilirubin Total: 0.6 mg/dL (ref 0.0–1.2)
Calcium: 10 mg/dL (ref 8.7–10.2)
Chloride: 99 mmol/L (ref 96–106)
Creatinine, Ser: 0.54 mg/dL — ABNORMAL LOW (ref 0.57–1.00)
Globulin, Total: 3 g/dL (ref 1.5–4.5)
Glucose: 88 mg/dL (ref 70–99)
Potassium: 4.5 mmol/L (ref 3.5–5.2)
Sodium: 140 mmol/L (ref 134–144)
Total Protein: 7.3 g/dL (ref 6.0–8.5)
eGFR: 111 mL/min/1.73 (ref >59)

## 2024-07-09 LAB — COMPLIANCE DRUG ANALYSIS, UR

## 2024-07-11 ENCOUNTER — Ambulatory Visit
Admission: RE | Admit: 2024-07-11 | Discharge: 2024-07-11 | Disposition: A | Discharge status: Home / Self Care | Attending: Pain Medicine | Admitting: Pain Medicine

## 2024-07-11 ENCOUNTER — Ambulatory Visit
Admission: RE | Admit: 2024-07-11 | Discharge: 2024-07-11 | Disposition: A | Discharge status: Home / Self Care | Source: Ambulatory Visit | Attending: Pain Medicine | Admitting: Pain Medicine

## 2024-07-11 DIAGNOSIS — M25551 Pain in right hip: Secondary | ICD-10-CM | POA: Insufficient documentation

## 2024-07-11 DIAGNOSIS — R937 Abnormal findings on diagnostic imaging of other parts of musculoskeletal system: Secondary | ICD-10-CM

## 2024-07-11 DIAGNOSIS — M79671 Pain in right foot: Secondary | ICD-10-CM | POA: Insufficient documentation

## 2024-07-11 DIAGNOSIS — M5442 Lumbago with sciatica, left side: Secondary | ICD-10-CM | POA: Diagnosis present

## 2024-07-11 DIAGNOSIS — M25552 Pain in left hip: Secondary | ICD-10-CM | POA: Diagnosis present

## 2024-07-11 DIAGNOSIS — R202 Paresthesia of skin: Secondary | ICD-10-CM | POA: Insufficient documentation

## 2024-07-11 DIAGNOSIS — M4800 Spinal stenosis, site unspecified: Secondary | ICD-10-CM | POA: Insufficient documentation

## 2024-07-11 DIAGNOSIS — M7138 Other bursal cyst, other site: Secondary | ICD-10-CM

## 2024-07-11 DIAGNOSIS — M25561 Pain in right knee: Secondary | ICD-10-CM | POA: Insufficient documentation

## 2024-07-11 DIAGNOSIS — M545 Low back pain, unspecified: Secondary | ICD-10-CM

## 2024-07-11 DIAGNOSIS — M25562 Pain in left knee: Secondary | ICD-10-CM | POA: Diagnosis present

## 2024-07-11 DIAGNOSIS — M5441 Lumbago with sciatica, right side: Secondary | ICD-10-CM | POA: Diagnosis present

## 2024-07-11 DIAGNOSIS — M48061 Spinal stenosis, lumbar region without neurogenic claudication: Secondary | ICD-10-CM

## 2024-07-11 DIAGNOSIS — M79672 Pain in left foot: Secondary | ICD-10-CM | POA: Insufficient documentation

## 2024-07-11 DIAGNOSIS — G8929 Other chronic pain: Secondary | ICD-10-CM | POA: Insufficient documentation

## 2024-07-11 DIAGNOSIS — M5416 Radiculopathy, lumbar region: Secondary | ICD-10-CM

## 2024-07-11 DIAGNOSIS — M79604 Pain in right leg: Secondary | ICD-10-CM | POA: Diagnosis present

## 2024-07-11 DIAGNOSIS — R200 Anesthesia of skin: Secondary | ICD-10-CM | POA: Diagnosis present

## 2024-07-11 DIAGNOSIS — M47816 Spondylosis without myelopathy or radiculopathy, lumbar region: Secondary | ICD-10-CM | POA: Diagnosis present

## 2024-07-11 DIAGNOSIS — Z9889 Other specified postprocedural states: Secondary | ICD-10-CM

## 2024-07-11 DIAGNOSIS — M79605 Pain in left leg: Secondary | ICD-10-CM | POA: Diagnosis present

## 2024-07-16 LAB — 25-HYDROXY VITAMIN D LCMS D2+D3
25-Hydroxy, Vitamin D-2: <1 ng/mL
25-Hydroxy, Vitamin D-3: 26 ng/mL
25-Hydroxy, Vitamin D: 26 ng/mL — ABNORMAL LOW

## 2024-07-16 LAB — VITAMIN B12: Vitamin B-12: 318 pg/mL (ref 232–1245)

## 2024-07-16 LAB — SEDIMENTATION RATE: Sed Rate: 38 mm/h (ref 0–40)

## 2024-07-16 LAB — MAGNESIUM: Magnesium: 2.2 mg/dL (ref 1.6–2.3)

## 2024-07-16 LAB — C-REACTIVE PROTEIN: CRP: 6 mg/L (ref 0–10)

## 2024-07-20 ENCOUNTER — Telehealth: Admitting: Physician Assistant

## 2024-07-20 DIAGNOSIS — R062 Wheezing: Secondary | ICD-10-CM | POA: Diagnosis not present

## 2024-07-20 DIAGNOSIS — J0110 Acute frontal sinusitis, unspecified: Secondary | ICD-10-CM

## 2024-07-20 MED ORDER — FLUTICASONE PROPIONATE 50 MCG/ACT NA SUSP
2.0000 | Freq: Every day | NASAL | 0 refills | Status: AC
Start: 2024-07-20 — End: ?

## 2024-07-20 MED ORDER — ALBUTEROL SULFATE HFA 108 (90 BASE) MCG/ACT IN AERS: 2.0000 | INHALATION_SPRAY | Freq: Four times a day (QID) | RESPIRATORY_TRACT | 0 refills | Status: AC | PRN

## 2024-07-20 MED ORDER — AMOXICILLIN-POT CLAVULANATE 875-125 MG PO TABS: 1.0000 | ORAL_TABLET | Freq: Two times a day (BID) | ORAL | 0 refills | Status: DC

## 2024-07-20 NOTE — Progress Notes (Signed)
 Virtual Visit Consent   Gabrielle Sampson, you are scheduled for a virtual visit with a [facility] provider today. Just as with appointments in the office, your consent must be obtained to participate. Your consent will be active for this visit and any virtual visit you may have with one of our providers in the next 365 days. If you have a MyChart account, a copy of this consent can be sent to you electronically.  As this is a virtual visit, video technology does not allow for your provider to perform a traditional examination. This may limit your provider's ability to fully assess your condition. If your provider identifies any concerns that need to be evaluated in person or the need to arrange testing (such as labs, EKG, etc.), we will make arrangements to do so. Although advances in technology are sophisticated, we cannot ensure that it will always work on either your end or our end. If the connection with a video visit is poor, the visit may have to be switched to a telephone visit. With either a video or telephone visit, we are not always able to ensure that we have a secure connection.  By engaging in this virtual visit, you consent to the provision of healthcare and authorize for your insurance to be billed (if applicable) for the services provided during this visit. Depending on your insurance coverage, you may receive a charge related to this service.  I need to obtain your verbal consent now. Are you willing to proceed with your visit today? Gabrielle Sampson has provided verbal consent on 07/20/2024 for a virtual visit (video or telephone). Gabrielle Sampson, [place]  Date: 07/20/2024 4:44 PM   Virtual Visit via Video Note   I, Gabrielle Sampson, connected with  Lua Feng  ([id number], 12-Aug-1971) on 07/20/24 at  4:15 PM EDT by a video-enabled telemedicine application and verified that I am speaking with the correct person using two  identifiers.  Location: Patient: Virtual Visit Location Patient: Home Provider: Virtual Visit Location Provider: Home Office   I discussed the limitations of evaluation and management by telemedicine and the availability of in person appointments. The patient expressed understanding and agreed to proceed.    History of Present Illness: Gabrielle Sampson is a 53 y.o. who identifies as a female who was assigned female at birth, and is being seen today for frontal sinus headache over the past few days preceded by nasal congestion, sinus congestion, post-nasal drip and  occasional cough. Denies chest congestion.  Notes fatigue and sinus pain now. Denies recent travel or known sick contact.  OTC -- Ibuprofen   HPI: HPI  Problems:  Patient Active Problem List   Diagnosis Date Noted   Chronic pain syndrome 07/06/2024   Pharmacologic therapy 07/06/2024   Disorder of skeletal system 07/06/2024   Problems influencing health status 07/06/2024   Chronic painful diabetic neuropathy (HCC) 07/06/2024   Abnormal MRI, lumbar spine (06/01/2024) 07/06/2024   Lumbar lateral recess stenosis (L4-5) (Bilateral) (L>R) 07/06/2024   Lumbar facet arthropathy 07/06/2024   Lumbar nerve root impingement (L5) (Left) 07/06/2024   Lumbar central spinal stenosis at L4-5 level (Severe) 07/06/2024   Central spinal stenosis (Severe) (L4-5) 07/06/2024   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) 07/06/2024   Chronic low back pain (1ry area of Pain) (Bilateral) w/ sciatica (Bilateral) 07/06/2024   Chronic lower extremity pain (2ry area of Pain) (Bilateral) 07/06/2024   Low back pain of over 3 months duration 07/06/2024  Low back pain potentially associated with radiculopathy 07/06/2024   Low back pain radiating to legs (Bilateral) 07/06/2024   Multifactorial low back pain 07/06/2024   Numbness and tingling of feet (Bilateral) 07/06/2024   Chronic hip pain (Bilateral) 07/06/2024   History of arthroscopy of  knee (Right) 07/06/2024   Abnormal uterine bleeding unrelated to menstrual cycle 07/05/2024   Abnormal vaginal bleeding 07/05/2024   Adjustment disorder 07/05/2024   Pruritus of vagina 07/05/2024   Hypertriglyceridemia 07/05/2024   Skin irritation 07/05/2024   Wheal 07/05/2024   Acute sinusitis 07/05/2024   Acute bacterial bronchitis 01/31/2024   Diabetic retinopathy of both eyes associated with type 2 diabetes mellitus (HCC) 12/29/2023   Arm pain, posterior, right 09/10/2023   H. pylori infection 09/10/2023   Elevated hemoglobin A1c 06/29/2023   Arthritis 06/29/2023   Candidiasis of vagina 06/29/2023   Cellulitis 06/29/2023   Endometritis 06/29/2023   Herpes labialis 06/29/2023   Irregular periods 06/29/2023   Lower abdominal pain 06/29/2023   Menorrhagia 06/29/2023   Pain in pelvis 06/29/2023   Reduced libido 06/29/2023   Vaginal discharge 06/29/2023   Neuropathy due to type 2 diabetes mellitus (HCC) 04/09/2023   Diarrhea 01/31/2023   Chronic constipation 01/28/2023   At risk for polypharmacy 11/25/2022   Painful skin lesion 11/25/2022   Hearing loss 08/22/2022   Neck pain on left side 07/16/2022   Tobacco use 05/01/2022   Encounter for repeat Pap smear due to previous insufficient cervical cells 01/16/2022   Anal skin tag 08/27/2021   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   External hemorrhoid 03/26/2021   Internal nasal lesion 03/26/2021   Left ovarian cyst 03/26/2021   Pain of left calf 02/07/2021   Cough 09/03/2020   Neoplasm of uncertain behavior 07/24/2020   Fatigue 06/29/2020   Hyperlipidemia 06/29/2020   Lipoma of left upper extremity 03/07/2020   Chronic feet pain (4th area of Pain) (Bilateral) 03/07/2020   History of acute pancreatitis 01/26/2020   Intertrigo 12/09/2019   Chronic knee pain (3ry area of Pain) (Bilateral) 11/28/2019   Spinal stenosis of lumbar region with neurogenic claudication 11/04/2019   Current every day smoker 06/27/2019   Edema 06/02/2019    Pain of right hip joint 06/02/2019   Adnexal cyst 09/22/2018   Varicose vein of leg 07/16/2016   Dysphagia 09/30/2015   Benign essential HTN 09/30/2015   Type 2 diabetes mellitus with complication, without long-term current use of insulin  (HCC) 09/30/2015   Angioedema 09/28/2015   Angioneurotic edema 09/28/2015   GERD (gastroesophageal reflux disease) 09/07/2015   Reactive airway disease without complication 07/13/2015   Trochanteric bursitis of both hips 06/23/2015   Type 2 diabetes mellitus with diabetic polyneuropathy (HCC) 06/23/2015   Restless leg syndrome 02/28/2015   Abdominal wall abscess 02/22/2015   History of MRSA infection 01/27/2015   Morbid obesity with body mass index of 50.0-59.9 in adult [facility]) 08/23/2014   Allergic rhinitis 11/27/2010   Sleep apnea 07/17/2009   Abdominal pain, epigastric 06/18/2007   PANCREATITIS 05/10/2007   Contact urticaria 05/10/2007   Acute pancreatitis without infection or necrosis 05/10/2007   Anxiety 04/29/2007   Generalized anxiety disorder with panic attacks 04/29/2007   Essential hypertension 04/29/2007   Benign neoplasm of skin 02/22/2007   Irritable bowel syndrome 02/22/2007   Hypertrophic and atrophic condition of skin 02/22/2007   Disturbance in sleep behavior 02/22/2007    Allergies:  Allergies  Allergen Reactions   Lisinopril Swelling and Anaphylaxis   Niacin Rash   Sulfa Antibiotics Hives  Betadine [Povidone Iodine] Hives   Codeine Phosphate Nausea And Vomiting   Empagliflozin Other (See Comments)    Contraindicated due to hx of groin infection   Iodine Hives   Morphine And Codeine    Medications:  Current Outpatient Medications:    albuterol  (VENTOLIN  HFA) 108 (90 Base) MCG/ACT inhaler, Inhale 2 puffs into the lungs every 6 (six) hours as needed for wheezing or shortness of breath., Disp: 8 g, Rfl: 0   amoxicillin -clavulanate (AUGMENTIN ) 875-125 MG tablet, Take 1 tablet by mouth 2 (two) times daily., Disp: 14 tablet,  Rfl: 0   fluticasone  (FLONASE ) 50 MCG/ACT nasal spray, Place 2 sprays into both nostrils daily., Disp: 16 g, Rfl: 0   amitriptyline (ELAVIL) 50 MG tablet, Take 50 mg by mouth at bedtime., Disp: , Rfl:    atorvastatin (LIPITOR) 80 MG tablet, Take 80 mg by mouth daily., Disp: , Rfl:    cyclobenzaprine (FLEXERIL) 10 MG tablet, Take 10 mg by mouth 2 (two) times daily as needed., Disp: , Rfl:    DULoxetine (CYMBALTA) 60 MG capsule, Take 60 mg by mouth daily., Disp: , Rfl:    famotidine  (PEPCID ) 20 MG tablet, Take 20 mg by mouth 2 (two) times daily., Disp: , Rfl:    hydrochlorothiazide (HYDRODIURIL) 25 MG tablet, Take 25 mg by mouth daily., Disp: , Rfl:    lidocaine  (XYLOCAINE ) 5 % ointment, 2 (two) times daily as needed., Disp: , Rfl:    losartan (COZAAR) 25 MG tablet, Take 25 mg by mouth daily., Disp: , Rfl:    metFORMIN (GLUCOPHAGE-XR) 500 MG 24 hr tablet, Take 1,000 mg by mouth 2 (two) times daily., Disp: , Rfl:    OZEMPIC, 1 MG/DOSE, 4 MG/3ML SOPN, Inject 1 mg into the skin., Disp: , Rfl:    pregabalin (LYRICA) 225 MG capsule, Take 225 mg by mouth 2 (two) times daily., Disp: , Rfl:   Observations/Objective: Patient is well-developed, well-nourished in no acute distress.  Resting comfortably at home.  Head is normocephalic, atraumatic.  No labored breathing.  Speech is clear and coherent with logical content.  Patient is alert and oriented at baseline.   Assessment and Plan: 1. Acute non-recurrent frontal sinusitis (Primary) - amoxicillin -clavulanate (AUGMENTIN ) 875-125 MG tablet; Take 1 tablet by mouth 2 (two) times daily.  Dispense: 14 tablet; Refill: 0 - fluticasone  (FLONASE ) 50 MCG/ACT nasal spray; Place 2 sprays into both nostrils daily.  Dispense: 16 g; Refill: 0  2. Wheezing - albuterol  (VENTOLIN  HFA) 108 (90 Base) MCG/ACT inhaler; Inhale 2 puffs into the lungs every 6 (six) hours as needed for wheezing or shortness of breath.  Dispense: 8 g; Refill: 0  Rx Augmentin .  Increase  fluids.  Rest.  Saline nasal spray.  Probiotic.  Mucinex as directed.  Humidifier in bedroom. Flonase  and Albuterol  per orders.  Call or return to clinic if symptoms are not improving.   Follow Up Instructions: I discussed the assessment and treatment plan with the patient. The patient was provided an opportunity to ask questions and all were answered. The patient agreed with the plan and demonstrated an understanding of the instructions.  A copy of instructions were sent to the patient via MyChart unless otherwise noted below.   The patient was advised to call back or seek an in-person evaluation if the symptoms worsen or if the condition fails to improve as anticipated.    Gabrielle Velma Lunger, PA-C

## 2024-07-20 NOTE — Patient Instructions (Signed)
 Gabrielle Sampson, thank you for joining Gabrielle Velma Lunger, PA-C for today's virtual visit.  While this provider is not your primary care provider (PCP), if your PCP is located in our provider database this encounter information will be shared with them immediately following your visit.   A Gabrielle Sampson account gives you access to today's visit and all your visits, tests, and labs performed at [name]  click here if you don't have a [facility] account or go to [email or web address]  Consent: (Patient) Gabrielle Sampson provided verbal consent for this virtual visit at the beginning of the encounter.  Current Medications:  Current Outpatient Medications:    albuterol  (VENTOLIN  HFA) 108 (90 Base) MCG/ACT inhaler, Inhale 2 puffs into the lungs every 6 (six) hours as needed for wheezing or shortness of breath., Disp: 8 g, Rfl: 0   amoxicillin -clavulanate (AUGMENTIN ) 875-125 MG tablet, Take 1 tablet by mouth 2 (two) times daily., Disp: 14 tablet, Rfl: 0   fluticasone  (FLONASE ) 50 MCG/ACT nasal spray, Place 2 sprays into both nostrils daily., Disp: 16 g, Rfl: 0   amitriptyline (ELAVIL) 50 MG tablet, Take 50 mg by mouth at bedtime., Disp: , Rfl:    atorvastatin (LIPITOR) 80 MG tablet, Take 80 mg by mouth daily., Disp: , Rfl:    cyclobenzaprine (FLEXERIL) 10 MG tablet, Take 10 mg by mouth 2 (two) times daily as needed., Disp: , Rfl:    DULoxetine (CYMBALTA) 60 MG capsule, Take 60 mg by mouth daily., Disp: , Rfl:    famotidine  (PEPCID ) 20 MG tablet, Take 20 mg by mouth 2 (two) times daily., Disp: , Rfl:    hydrochlorothiazide (HYDRODIURIL) 25 MG tablet, Take 25 mg by mouth daily., Disp: , Rfl:    lidocaine  (XYLOCAINE ) 5 % ointment, 2 (two) times daily as needed., Disp: , Rfl:    losartan (COZAAR) 25 MG tablet, Take 25 mg by mouth daily., Disp: , Rfl:    metFORMIN (GLUCOPHAGE-XR) 500 MG 24 hr tablet, Take 1,000 mg by mouth 2 (two) times daily., Disp:  , Rfl:    OZEMPIC, 1 MG/DOSE, 4 MG/3ML SOPN, Inject 1 mg into the skin., Disp: , Rfl:    pregabalin (LYRICA) 225 MG capsule, Take 225 mg by mouth 2 (two) times daily., Disp: , Rfl:    Medications ordered in this encounter:  Meds ordered this encounter  Medications   amoxicillin -clavulanate (AUGMENTIN ) 875-125 MG tablet    Sig: Take 1 tablet by mouth 2 (two) times daily.    Dispense:  14 tablet    Refill:  0    Supervising Provider:   LAMPTEY, PHILIP O [[id number]]   fluticasone  (FLONASE ) 50 MCG/ACT nasal spray    Sig: Place 2 sprays into both nostrils daily.    Dispense:  16 g    Refill:  0    Supervising Provider:   BLAISE ALEENE KIDD [[id number]]   albuterol  (VENTOLIN  HFA) 108 (90 Base) MCG/ACT inhaler    Sig: Inhale 2 puffs into the lungs every 6 (six) hours as needed for wheezing or shortness of breath.    Dispense:  8 g    Refill:  0    Supervising Provider:   BLAISE ALEENE KIDD [[id number]]     *If you need refills on other medications prior to your next appointment, please contact your pharmacy*  Follow-Up: Call back or seek an in-person evaluation if the symptoms worsen or if the condition fails to improve as anticipated.  [name] [phone]  Other Instructions Please take antibiotic as directed.  Increase fluid intake.  Use Saline nasal spray.  Take a daily multivitamin. Use the prescribed medications as directed.  Place a humidifier in the bedroom.  Please call or return clinic if symptoms are not improving.  Sinusitis Sinusitis is redness, soreness, and swelling (inflammation) of the paranasal sinuses. Paranasal sinuses are air pockets within the bones of your face (beneath the eyes, the middle of the forehead, or above the eyes). In healthy paranasal sinuses, mucus is able to drain out, and air is able to circulate through them by way of your nose. However, when your paranasal sinuses are inflamed, mucus and air can become trapped. This can allow bacteria and  other germs to grow and cause infection. Sinusitis can develop quickly and last only a short time (acute) or continue over a long period (chronic). Sinusitis that lasts for more than 12 weeks is considered chronic.  CAUSES  Causes of sinusitis include: Allergies. Structural abnormalities, such as displacement of the cartilage that separates your nostrils (deviated septum), which can decrease the air flow through your nose and sinuses and affect sinus drainage. Functional abnormalities, such as when the small hairs (cilia) that line your sinuses and help remove mucus do not work properly or are not present. SYMPTOMS  Symptoms of acute and chronic sinusitis are the same. The primary symptoms are pain and pressure around the affected sinuses. Other symptoms include: Upper toothache. Earache. Headache. Bad breath. Decreased sense of smell and taste. A cough, which worsens when you are lying flat. Fatigue. Fever. Thick drainage from your nose, which often is green and may contain pus (purulent). Swelling and warmth over the affected sinuses. DIAGNOSIS  Your caregiver will perform a physical exam. During the exam, your caregiver may: Look in your nose for signs of abnormal growths in your nostrils (nasal polyps). Tap over the affected sinus to check for signs of infection. View the inside of your sinuses (endoscopy) with a special imaging device with a light attached (endoscope), which is inserted into your sinuses. If your caregiver suspects that you have chronic sinusitis, one or more of the following tests may be recommended: Allergy tests. Nasal culture A sample of mucus is taken from your nose and sent to a lab and screened for bacteria. Nasal cytology A sample of mucus is taken from your nose and examined by your caregiver to determine if your sinusitis is related to an allergy. TREATMENT  Most cases of acute sinusitis are related to a viral infection and will resolve on their own within  10 days. Sometimes medicines are prescribed to help relieve symptoms (pain medicine, decongestants, nasal steroid sprays, or saline sprays).  However, for sinusitis related to a bacterial infection, your caregiver will prescribe antibiotic medicines. These are medicines that will help kill the bacteria causing the infection.  Rarely, sinusitis is caused by a fungal infection. In theses cases, your caregiver will prescribe antifungal medicine. For some cases of chronic sinusitis, surgery is needed. Generally, these are cases in which sinusitis recurs more than 3 times per year, despite other treatments. HOME CARE INSTRUCTIONS  Drink plenty of water. Water helps thin the mucus so your sinuses can drain more easily. Use a humidifier. Inhale steam 3 to 4 times a day (for example, sit in the bathroom with the shower running). Apply a warm, moist washcloth to your face 3 to 4 times a day, or as directed by your caregiver. Use saline nasal sprays to help moisten and  clean your sinuses. Take over-the-counter or prescription medicines for pain, discomfort, or fever only as directed by your caregiver. SEEK IMMEDIATE MEDICAL CARE IF: You have increasing pain or severe headaches. You have nausea, vomiting, or drowsiness. You have swelling around your face. You have vision problems. You have a stiff neck. You have difficulty breathing. MAKE SURE YOU:  Understand these instructions. Will watch your condition. Will get help right away if you are not doing well or get worse. Document Released: 11/03/2005 Document Revised: 01/26/2012 Document Reviewed: 11/18/2011 [facility] Patient Information 2014 [place], [place].    If you have been instructed to have an in-person evaluation today at a local Urgent Care facility, please use the link below. It will take you to a list of all of our available [facility], including address, phone number and hours of operation. Please do not delay care.  [facility]  If you or a family member do not have a primary care provider, use the link below to schedule a visit and establish care. When you choose a [facility] primary care physician or advanced practice provider, you gain a long-term partner in health. Find a Primary Care Provider  Learn more about [facility]'s in-office and virtual care options:  - Get Care Now

## 2024-07-29 NOTE — Progress Notes (Signed)
 Referring Physician:  Eliazar Hart LABOR, MD [address] [place],  [place] [address]  Primary Physician:  Eliazar Hart LABOR, MD  History of Present Illness: [name] has a history of HTN, reactive airway disease, IBS, pancreatitis, GERD, DM with neuropathy, depression, hyperlipidemia, obesity, RLS.   She last had video visit with me on 06/02/24 to review her lumbar MRI. 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 Also with mild right foraminal stenosis L5-S1.   She was sent to PT ([facility]) and referred to Dr. Naveira for possible injections along with [facility]. We discussed possible lumbar MRI with and without contrast to further evaluate synovial cyst and she declined.   She had initial eval for PT on 06/15/24. She saw Dr. Tanya on 07/06/24 and has f/u with him on 08/15/24.   She is here for follow up.   She is about the same. She continues with constant LBP. She now has bilateral intermittent leg pain from her calf to toes. Pain is worse with standing and walking. She has intermittent numbness and tingling in both legs. Pain is better with sitting or laying flat. She also has bilateral intermittent knee pain.   She also notes new numbness/tingling and weakness in her hands. She has been dropping things. She is having new dexterity issues. She has intermittent neck pain with no arm pain. She notes balance issues.   Her last HgbA1c on 02/22/24 was 7.   No relief with previous lumbar injections in 2022 and 2023.   She smokes 1/2 ppd x 30 years.   Bowel/Bladder Dysfunction: none  Conservative measures:  Physical therapy: [facility] initial eval 06/15/24- last visit was 07/30/2024 due to death in the family Multimodal medical therapy including regular antiinflammatories: Flexeril, Oxycodone, Lidocaine  patches, Tylenol , Cymbalta, Lyrica  Injections:   L4-L5 IL [facility] 02/17/22 [facility] L4-L5 IL [facility] 10/31/21 [facility]  Past Surgery: none  [name] has no symptoms of cervical myelopathy.  The symptoms are causing a significant impact on the patient's life.   Review of Systems:  A 10 point review of systems is negative, except for the pertinent positives and negatives detailed in the HPI.  Past Medical History: Past Medical History:  Diagnosis Date   Anxiety    Diabetes (HCC)    Fatigue    HBP (high blood pressure)    Morbid obesity (HCC)    Neuropathy    Osteoarthritis    Pancreatitis    Reflux     Past Surgical History: Past Surgical History:  Procedure Laterality Date   ABSCESS DRAINAGE     MRSA from abdominal wound   ANAL FISSURE REPAIR     CARDIAC CATHETERIZATION     CESAREAN SECTION  1996 AND 1999   ESOPHAGOGASTRODUODENOSCOPY (EGD) WITH PROPOFOL  N/A 10/25/2015   Procedure: ESOPHAGOGASTRODUODENOSCOPY (EGD) WITH PROPOFOL ;  Surgeon: Lamar ONEIDA Holmes, MD;  Location: [facility] ENDOSCOPY;  Service: Endoscopy;  Laterality: N/A;   GALLBLADDER SURGERY  1996   KNEE SURGERY Right 2012   TONSILLECTOMY AND ADENOIDECTOMY     TUBAL LIGATION      Allergies: Allergies as of 08/03/2024 - Review Complete 08/03/2024  Allergen Reaction Noted   Lisinopril Swelling and Anaphylaxis 09/28/2015   Niacin Rash 01/16/2009   Sulfa antibiotics Hives 06/14/2015   Betadine [povidone iodine] Hives 08/19/2013   Codeine phosphate Nausea And Vomiting 01/19/2007   Empagliflozin Other (See Comments) 02/15/2021  Iodine Hives 08/18/2013   Morphine and codeine  08/18/2013    Medications: Outpatient Encounter Medications as of 08/03/2024  Medication Sig   albuterol  (VENTOLIN  HFA) 108 (90 Base) MCG/ACT inhaler Inhale 2 puffs into the lungs every 6 (six) hours as needed for wheezing or shortness of breath.   amitriptyline (ELAVIL) 50 MG tablet Take 50 mg by mouth at bedtime.   amoxicillin -clavulanate (AUGMENTIN ) 875-125 MG tablet Take 1 tablet by  mouth 2 (two) times daily.   atorvastatin (LIPITOR) 80 MG tablet Take 80 mg by mouth daily.   cyclobenzaprine (FLEXERIL) 10 MG tablet Take 10 mg by mouth 2 (two) times daily as needed.   DULoxetine (CYMBALTA) 60 MG capsule Take 60 mg by mouth daily.   famotidine  (PEPCID ) 20 MG tablet Take 20 mg by mouth 2 (two) times daily.   fluticasone  (FLONASE ) 50 MCG/ACT nasal spray Place 2 sprays into both nostrils daily.   hydrochlorothiazide (HYDRODIURIL) 25 MG tablet Take 25 mg by mouth daily.   lidocaine  (XYLOCAINE ) 5 % ointment 2 (two) times daily as needed.   losartan (COZAAR) 25 MG tablet Take 25 mg by mouth daily.   metFORMIN (GLUCOPHAGE-XR) 500 MG 24 hr tablet Take 1,000 mg by mouth 2 (two) times daily.   OZEMPIC, 1 MG/DOSE, 4 MG/3ML SOPN Inject 1 mg into the skin.   pregabalin (LYRICA) 225 MG capsule Take 225 mg by mouth 2 (two) times daily.   No facility-administered encounter medications on file as of 08/03/2024.    Social History: Social History   Tobacco Use   Smoking status: Every Day    Current packs/day: 0.00    Average packs/day: 0.5 packs/day for 20.0 years (10.0 ttl pk-yrs)    Types: Cigarettes    Start date: 08/08/1993    Last attempt to quit: 08/08/2013    Years since quitting: 10.9   Smokeless tobacco: Never  Substance Use Topics   Alcohol use: No   Drug use: No    Family Medical History: Family History  Problem Relation Age of Onset   Hypothyroidism Mother    Melanoma Mother    CAD Father     Physical Examination: Vitals:   08/03/24 0826  BP: 116/78      Awake, alert, oriented to person, place, and time.  Speech is clear and fluent. Fund of knowledge is appropriate.   Cranial Nerves: Pupils equal round and reactive to light.  Facial tone is symmetric.    No abnormal lesions on exposed skin.   Strength: Side Biceps Triceps Deltoid Interossei Grip Wrist Ext. Wrist Flex.  R 5 5 5 5 5 5 5   L 5 5 5 5 5 5 5    Side Iliopsoas Quads Hamstring PF DF EHL  R 5  5 5 5 5 5   L 5 5 5 5 5 5    Reflexes are 2+ and symmetric at the biceps, brachioradialis, patella and achilles.   Hoffman's is absent.  Clonus is not present.   Bilateral upper and lower extremity sensation is intact to light touch.     No pain with IR/ER of both hips.   Gait is normal.    Medical Decision Making  Imaging: Lumbar xrays dated 07/11/24:  FINDINGS: Soft tissue attenuation from habitus limits detailed assessment. There are 5 non-rib-bearing lumbar vertebra. 8 mm anterolisthesis of L4 on L5. Limited range of motion on flexion and extension, but no evidence of instability. Anterior spurring L1-L2, L3-L4, L4-L5, and L5-S1. L3-L4 and L4-L5 facet hypertrophy. Hemangiomas on prior  MRI are not well demonstrated by radiograph. Vascular calcifications in the left upper quadrant   IMPRESSION: 1. Grade 1 anterolisthesis of L4 on L5. No evidence of instability. 2. Multilevel facet hypertrophy.     Electronically Signed   By: Andrea Gasman M.D.   On: 07/18/2024 23:11  I have personally reviewed the images and agree with the above interpretation.   Assessment and Plan: Ms. Malmquist continues with constant LBP. She now has bilateral intermittent leg pain from her calf to toes. Pain is worse with standing and walking. She has intermittent numbness and tingling in both legs.  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 Also with mild right foraminal stenosis L5-S1.   She also notes new numbness/tingling and weakness in her hands. She has been dropping things. She is having new dexterity issues. She has intermittent neck pain with no arm pain. She notes balance issues.   No cervical imaging.     Treatment options discussed with patient and following plan made:    - MRI of cervical spine to further evaluate neck pain, dexterity issues.  - Follow up with Dr. Naveira as  scheduled to discuss possible injections.  - Continue with PT for lumbar spine- has done 4 weeks.  - Continue to work on weight loss. Goal prior to surgery is 210lbs. She is doing well.  - She would need to quit smoking prior to any surgery.  - Of note, last HgbA1c on 02/22/24 was 7.  - Will do MyChart visit to review her cervical MRI once I have results. Depending on these results, may consider EMG.   I spent a total of 35 minutes in face-to-face and non-face-to-face activities related to this patient's care today including review of outside records, review of imaging, review of symptoms, physical exam, discussion of differential diagnosis, discussion of treatment options, and documentation.   Glade Boys PA-C Dept. of Neurosurgery

## 2024-08-01 ENCOUNTER — Ambulatory Visit: Admitting: Pain Medicine

## 2024-08-03 ENCOUNTER — Ambulatory Visit (INDEPENDENT_AMBULATORY_CARE_PROVIDER_SITE_OTHER): Admitting: Orthopedic Surgery

## 2024-08-03 ENCOUNTER — Encounter: Payer: Self-pay | Admitting: Orthopedic Surgery

## 2024-08-03 VITALS — BP 116/78 | Ht 65.0 in | Wt 262.0 lb

## 2024-08-03 DIAGNOSIS — M4726 Other spondylosis with radiculopathy, lumbar region: Secondary | ICD-10-CM

## 2024-08-03 DIAGNOSIS — M48062 Spinal stenosis, lumbar region with neurogenic claudication: Secondary | ICD-10-CM

## 2024-08-03 DIAGNOSIS — M47816 Spondylosis without myelopathy or radiculopathy, lumbar region: Secondary | ICD-10-CM

## 2024-08-03 DIAGNOSIS — R278 Other lack of coordination: Secondary | ICD-10-CM

## 2024-08-03 DIAGNOSIS — M5416 Radiculopathy, lumbar region: Secondary | ICD-10-CM

## 2024-08-03 DIAGNOSIS — M5126 Other intervertebral disc displacement, lumbar region: Secondary | ICD-10-CM | POA: Diagnosis not present

## 2024-08-03 DIAGNOSIS — M4316 Spondylolisthesis, lumbar region: Secondary | ICD-10-CM

## 2024-08-03 DIAGNOSIS — R200 Anesthesia of skin: Secondary | ICD-10-CM

## 2024-08-03 DIAGNOSIS — M48061 Spinal stenosis, lumbar region without neurogenic claudication: Secondary | ICD-10-CM

## 2024-08-03 DIAGNOSIS — M7138 Other bursal cyst, other site: Secondary | ICD-10-CM

## 2024-08-03 DIAGNOSIS — R202 Paresthesia of skin: Secondary | ICD-10-CM

## 2024-08-03 DIAGNOSIS — M542 Cervicalgia: Secondary | ICD-10-CM

## 2024-08-03 NOTE — Patient Instructions (Signed)
 It was so nice to see you today. Thank you so much for coming in.    I want to get an MRI of your neck to look into things further. We will get this approved through your insurance and [facility] will call you to schedule the appointment. Ask about your patient responsibility. You do not need to pay this prior to getting MRI, they can bill you.   [facility] (building with the white pillars) is located off of [address]. The address is [address] [place], [place] [address].    After you have the MRI, it can take 14-28 days for me to get the results back. If I don't have them in 2 weeks, we will call to try to get the results.   Once I have the results, we will call you to schedule a follow up MyChart visit with me to review them.   Follow up with Dr. Naveira as scheduled to discuss further options.   Continue with PT for your back.   Continue to work on weight loss. You are doing great! Goal weight prior to any surgery is 210lbs.   You would need to quit smoking prior to any back surgery.   Please do not hesitate to call if you have any questions or concerns. You can also message me in MyChart.   Glade Boys PA-C [phone]     The physicians and staff at [facility] at [facility] are committed to providing excellent care. You may receive a survey asking for feedback about your experience at our office. We value you your feedback and appreciate you taking the time to to fill it out. The [facility] leadership team is also available to discuss your experience in person, feel free to contact us  [phone].

## 2024-08-05 ENCOUNTER — Other Ambulatory Visit (INDEPENDENT_AMBULATORY_CARE_PROVIDER_SITE_OTHER): Payer: Self-pay | Admitting: Vascular Surgery

## 2024-08-05 DIAGNOSIS — I83813 Varicose veins of bilateral lower extremities with pain: Secondary | ICD-10-CM

## 2024-08-08 ENCOUNTER — Encounter (INDEPENDENT_AMBULATORY_CARE_PROVIDER_SITE_OTHER): Payer: Self-pay | Admitting: Vascular Surgery

## 2024-08-08 ENCOUNTER — Ambulatory Visit (INDEPENDENT_AMBULATORY_CARE_PROVIDER_SITE_OTHER): Admitting: Vascular Surgery

## 2024-08-08 ENCOUNTER — Ambulatory Visit (INDEPENDENT_AMBULATORY_CARE_PROVIDER_SITE_OTHER)

## 2024-08-08 VITALS — BP 126/83 | HR 78 | Resp 18 | Ht 65.5 in | Wt 263.8 lb

## 2024-08-08 DIAGNOSIS — E1142 Type 2 diabetes mellitus with diabetic polyneuropathy: Secondary | ICD-10-CM | POA: Diagnosis not present

## 2024-08-08 DIAGNOSIS — I83813 Varicose veins of bilateral lower extremities with pain: Secondary | ICD-10-CM | POA: Diagnosis not present

## 2024-08-08 DIAGNOSIS — I83819 Varicose veins of unspecified lower extremities with pain: Secondary | ICD-10-CM | POA: Diagnosis not present

## 2024-08-08 DIAGNOSIS — I10 Essential (primary) hypertension: Secondary | ICD-10-CM

## 2024-08-08 DIAGNOSIS — K219 Gastro-esophageal reflux disease without esophagitis: Secondary | ICD-10-CM

## 2024-08-08 NOTE — Progress Notes (Signed)
 MRN : [id number]  Gabrielle Sampson is a 53 y.o. (09/25/1971) female who presents with chief complaint of varicose veins hurt.  History of Present Illness:   The patient is seen for evaluation of symptomatic varicose veins. The patient relates burning and stinging which worsened steadily throughout the course of the day, particularly with standing. The patient also notes an aching and throbbing pain over the varicosities, particularly with prolonged dependent positions. The symptoms are significantly improved with elevation.  The patient also notes that during hot weather the symptoms are greatly intensified. The patient states the pain from the varicose veins interferes with work, daily exercise, shopping and household maintenance. At this point, the symptoms are persistent and severe enough that they're having a negative impact on lifestyle and are interfering with daily activities.  There is no history of DVT, PE or superficial thrombophlebitis. There is no history of ulceration or hemorrhage. The patient denies a significant family history of varicose veins.  The patient has not worn graduated compression routinely in the past. At the present time the patient has not been using over-the-counter analgesics. There is no history of prior surgical intervention or sclerotherapy.  Duplex ultrasound of the venous system demonstrates normal deep venous system bilaterally.  The right great saphenous vein is incompetent.  Left great saphenous vein is competent.     Current Meds  Medication Sig   albuterol  (VENTOLIN  HFA) 108 (90 Base) MCG/ACT inhaler Inhale 2 puffs into the lungs every 6 (six) hours as needed for wheezing or shortness of breath.   amitriptyline (ELAVIL) 50 MG tablet Take 50 mg by mouth at bedtime.   atorvastatin (LIPITOR) 80 MG tablet Take 80 mg by mouth daily.   cyclobenzaprine (FLEXERIL) 10 MG tablet Take 10 mg by mouth 2 (two) times daily as needed.    DULoxetine (CYMBALTA) 60 MG capsule Take 60 mg by mouth daily.   famotidine  (PEPCID ) 20 MG tablet Take 20 mg by mouth 2 (two) times daily.   fluticasone  (FLONASE ) 50 MCG/ACT nasal spray Place 2 sprays into both nostrils daily.   hydrochlorothiazide (HYDRODIURIL) 25 MG tablet Take 25 mg by mouth daily.   lidocaine  (XYLOCAINE ) 5 % ointment 2 (two) times daily as needed.   losartan (COZAAR) 25 MG tablet Take 25 mg by mouth daily.   metFORMIN (GLUCOPHAGE-XR) 500 MG 24 hr tablet Take 1,000 mg by mouth 2 (two) times daily.   OZEMPIC, 1 MG/DOSE, 4 MG/3ML SOPN Inject 1 mg into the skin.   pregabalin (LYRICA) 225 MG capsule Take 225 mg by mouth 2 (two) times daily.    Past Medical History:  Diagnosis Date   Anxiety    Diabetes (HCC)    Fatigue    HBP (high blood pressure)    Morbid obesity (HCC)    Neuropathy    Osteoarthritis    Pancreatitis    Reflux     Past Surgical History:  Procedure Laterality Date   ABSCESS DRAINAGE     MRSA from abdominal wound   ANAL FISSURE REPAIR     CARDIAC CATHETERIZATION     CESAREAN SECTION  1996 AND 1999   ESOPHAGOGASTRODUODENOSCOPY (EGD) WITH PROPOFOL  N/A 10/25/2015   Procedure: ESOPHAGOGASTRODUODENOSCOPY (EGD) WITH PROPOFOL ;  Surgeon: Lamar ONEIDA Holmes, MD;  Location: [facility] ENDOSCOPY;  Service: Endoscopy;  Laterality: N/A;   GALLBLADDER SURGERY  1996   KNEE SURGERY Right 2012   TONSILLECTOMY AND ADENOIDECTOMY  TUBAL LIGATION      Social History Social History   Tobacco Use   Smoking status: Every Day    Current packs/day: 0.00    Average packs/day: 0.5 packs/day for 20.0 years (10.0 ttl pk-yrs)    Types: Cigarettes    Start date: 08/08/1993    Last attempt to quit: 08/08/2013    Years since quitting: 11.0   Smokeless tobacco: Never  Substance Use Topics   Alcohol use: No   Drug use: No    Family History Family History  Problem Relation Age of Onset   Hypothyroidism Mother    Melanoma Mother    CAD Father     Allergies  Allergen  Reactions   Lisinopril Swelling and Anaphylaxis   Niacin Rash   Sulfa Antibiotics Hives   Betadine [Povidone Iodine] Hives   Codeine Phosphate Nausea And Vomiting   Empagliflozin Other (See Comments)    Contraindicated due to hx of groin infection   Iodine Hives   Morphine And Codeine      REVIEW OF SYSTEMS (Negative unless checked)  Constitutional: [] Weight loss  [] Fever  [] Chills Cardiac: [] Chest pain   [] Chest pressure   [] Palpitations   [] Shortness of breath when laying flat   [] Shortness of breath with exertion. Vascular:  [] Pain in legs with walking   [x] Pain in legs with standing  [] History of DVT   [] Phlebitis   [] Swelling in legs   [x] Varicose veins   [] Non-healing ulcers Pulmonary:   [] Uses home oxygen   [] Productive cough   [] Hemoptysis   [] Wheeze  [] COPD   [] Asthma Neurologic:  [] Dizziness   [] Seizures   [] History of stroke   [] History of TIA  [] Aphasia   [] Vissual changes   [] Weakness or numbness in arm   [] Weakness or numbness in leg Musculoskeletal:   [] Joint swelling   [] Joint pain   [] Low back pain Hematologic:  [] Easy bruising  [] Easy bleeding   [] Hypercoagulable state   [] Anemic Gastrointestinal:  [] Diarrhea   [] Vomiting  [] Gastroesophageal reflux/heartburn   [] Difficulty swallowing. Genitourinary:  [] Chronic kidney disease   [] Difficult urination  [] Frequent urination   [] Blood in urine Skin:  [] Rashes   [] Ulcers  Psychological:  [] History of anxiety   []  History of major depression.  Physical Examination  Vitals:   08/08/24 0828  BP: 126/83  Pulse: 78  Resp: 18  Weight: 263 lb 12.8 oz (119.7 kg)  Height: 5' 5.5 (1.664 m)   Body mass index is 43.23 kg/m. Gen: WD/WN, NAD Head: [place]/AT, No temporalis wasting.  Ear/Nose/Throat: Hearing grossly intact, nares w/o erythema or drainage, pinna without lesions Eyes: PER, EOMI, sclera nonicteric.  Neck: Supple, no gross masses.  No JVD.  Pulmonary:  Good air movement, no audible wheezing, no use of accessory  muscles.  Cardiac: RRR, precordium not hyperdynamic. Vascular:  Large varicosities present, greater than 10 mm right.  Veins are tender to palpation  Mild venous stasis changes to the legs bilaterally.  Trace soft pitting edema CEAP C3sEpAsPr Vessel Right Left  Radial Palpable Palpable  Gastrointestinal: soft, non-distended. No guarding/no peritoneal signs.  Musculoskeletal: M/S 5/5 throughout.  No deformity.  Neurologic: CN 2-12 intact. Pain and light touch intact in extremities.  Symmetrical.  Speech is fluent. Motor exam as listed above. Psychiatric: Judgment intact, Mood & affect appropriate for pt's clinical situation. Dermatologic: Venous rashes no ulcers noted.  No changes consistent with cellulitis. Lymph : No lichenification or skin changes of chronic lymphedema.  CBC Lab Results  Component Value  Date   WBC 13.3 (H) 12/22/2016   HGB 11.5 (L) 12/22/2016   HCT 33.8 (L) 12/22/2016   MCV 84.9 12/22/2016   PLT 249 12/22/2016    BMET    Component Value Date/Time   NA 140 07/06/2024 1006   NA 137 02/08/2015 0355   K 4.5 07/06/2024 1006   K 4.0 02/08/2015 0355   CL 99 07/06/2024 1006   CL 103 02/08/2015 0355   CO2 28 12/22/2016 2249   CO2 29 02/08/2015 0355   GLUCOSE 88 07/06/2024 1006   GLUCOSE 278 (H) 12/22/2016 2249   GLUCOSE 107 (H) 02/08/2015 0355   BUN 13 07/06/2024 1006   BUN 19 02/08/2015 0355   CREATININE 0.54 (L) 07/06/2024 1006   CREATININE 0.76 02/08/2015 0355   CALCIUM 10.0 07/06/2024 1006   CALCIUM 8.7 (L) 02/08/2015 0355   GFRNONAA >60 12/22/2016 2249   GFRNONAA >60 02/08/2015 0355   GFRAA >60 12/22/2016 2249   GFRAA >60 02/08/2015 0355   CrCl cannot be calculated (Patient's most recent lab result is older than the maximum 21 days allowed.).  COAG Lab Results  Component Value Date   INR 1.1 02/07/2015    Radiology DG Knee Complete 4 Views Left Result Date: 07/18/2024 CLINICAL DATA:  Left knee pain/arthralgia. EXAM: LEFT KNEE - COMPLETE 4+ VIEW  COMPARISON:  None Available. FINDINGS: Medial tibiofemoral joint space narrowing. Mild to moderate tricompartmental peripheral spurring. No fracture, erosion, or focal bone abnormality. No joint effusion. Unremarkable soft tissues. IMPRESSION: Mild to moderate tricompartmental osteoarthritis. Electronically Signed   By: Andrea Gasman M.D.   On: 07/18/2024 23:12   DG HIPS BILAT W OR W/O PELVIS MIN 5 VIEWS Result Date: 07/18/2024 CLINICAL DATA:  Chronic bilateral hip pain. EXAM: DG HIP (WITH OR WITHOUT PELVIS) 5+V BILAT COMPARISON:  None Available. FINDINGS: Mild bilateral hip joint space narrowing with acetabular spurring. Femoral heads are well seated. No fracture. No evidence of erosion, avascular necrosis, or focal bone lesion. Sacroiliac joints are congruent. No focal soft tissue abnormalities. IMPRESSION: Mild bilateral hip osteoarthritis. Electronically Signed   By: Andrea Gasman M.D.   On: 07/18/2024 23:11   DG Lumbar Spine Complete W/Bend Result Date: 07/18/2024 CLINICAL DATA:  Provided history: Abnormal lumbar MRI. Bilateral stenosis of lateral recess of lumbar spine. Facet arthropathy of the lumbar spine. Lumbar nerve root impingement. Spinal stenosis at L4-L5. Central spinal stenosis. Synovial cyst of lumbar facet joint. Chronic bilateral low back pain with bilateral sciatica. Low back pain of over 3 months duration. Low back pain potentially associated with radiculopathy. Low back pain radiating to both legs. Multifactorial low back pain. Chronic pain of lower extremity, bilateral. Bilateral chronic knee pain. Chronic pain of both feet. Numbness and tingling of both feet. Chronic hip pain. EXAM: DG LUMBAR SPINE COMPLETE W/ BEND COMPARISON:  Lumbar MRI 05/31/2024 FINDINGS: Soft tissue attenuation from habitus limits detailed assessment. There are 5 non-rib-bearing lumbar vertebra. 8 mm anterolisthesis of L4 on L5. Limited range of motion on flexion and extension, but no evidence of instability.  Anterior spurring L1-L2, L3-L4, L4-L5, and L5-S1. L3-L4 and L4-L5 facet hypertrophy. Hemangiomas on prior MRI are not well demonstrated by radiograph. Vascular calcifications in the left upper quadrant IMPRESSION: 1. Grade 1 anterolisthesis of L4 on L5. No evidence of instability. 2. Multilevel facet hypertrophy. Electronically Signed   By: Andrea Gasman M.D.   On: 07/18/2024 23:11   DG Knee Complete 4 Views Right Result Date: 07/18/2024 CLINICAL DATA:  Right knee pain/arthralgia.  History of arthroscopy. EXAM: RIGHT KNEE - COMPLETE 4+ VIEW COMPARISON:  None Available. FINDINGS: Medial tibiofemoral joint space narrowing. Mild to moderate tricompartmental peripheral spurring. Spurring of the tibial spines. No fracture, erosion, or focal bone abnormality. Small joint effusion. Subcutaneous soft tissue calcification medially in the distal thigh. IMPRESSION: Mild to moderate tricompartmental osteoarthritis, most prominent in the medial tibiofemoral compartment. Small joint effusion. Electronically Signed   By: Andrea Gasman M.D.   On: 07/18/2024 23:07     Assessment/Plan 1. Varicose veins with pain (Primary) Recommend:  The patient has large symptomatic varicose veins that are painful and associated with swelling.  I have had a discussion with the patient regarding  varicose veins and why they cause symptoms.  Patient will begin wearing graduated compression stockings class 1 on a daily basis, beginning first thing in the morning and removing them in the evening. The patient is instructed specifically not to sleep in the stockings.    The patient  will also begin using over-the-counter analgesics such as Motrin  600 mg po TID to help control the symptoms.    In addition, behavioral modification including elevation during the day will be initiated.    Venous reflux study done today shows normal deep system, severe reflux is noted throughout the great saphenous vein.  Pending the results of  conservative therapy the patient will be reevaluated in three months.     2. Essential hypertension Continue antihypertensive medications as already ordered, these medications have been reviewed and there are no changes at this time.  3. Gastroesophageal reflux disease without esophagitis Continue PPI as already ordered, this medication has been reviewed and there are no changes at this time.  Avoidence of caffeine and alcohol  Moderate elevation of the head of the bed   4. Type 2 diabetes mellitus with diabetic polyneuropathy, without long-term current use of insulin  (HCC) Continue hypoglycemic medications as already ordered, these medications have been reviewed and there are no changes at this time.  Hgb A1C to be monitored as already arranged by primary service    Cordella Shawl, MD  08/08/2024 8:36 AM

## 2024-08-09 ENCOUNTER — Ambulatory Visit
Admission: RE | Admit: 2024-08-09 | Discharge: 2024-08-09 | Disposition: A | Discharge status: Home / Self Care | Source: Ambulatory Visit | Attending: Orthopedic Surgery | Admitting: Orthopedic Surgery

## 2024-08-09 DIAGNOSIS — R200 Anesthesia of skin: Secondary | ICD-10-CM | POA: Insufficient documentation

## 2024-08-09 DIAGNOSIS — R202 Paresthesia of skin: Secondary | ICD-10-CM | POA: Insufficient documentation

## 2024-08-09 DIAGNOSIS — M542 Cervicalgia: Secondary | ICD-10-CM | POA: Diagnosis present

## 2024-08-09 DIAGNOSIS — R278 Other lack of coordination: Secondary | ICD-10-CM | POA: Diagnosis present

## 2024-08-15 ENCOUNTER — Encounter: Payer: Self-pay | Admitting: Pain Medicine

## 2024-08-15 ENCOUNTER — Ambulatory Visit: Attending: Pain Medicine | Admitting: Pain Medicine

## 2024-08-15 VITALS — BP 129/76 | HR 88 | Temp 97.5°F | Resp 165 | Ht 65.0 in | Wt 260.0 lb

## 2024-08-15 DIAGNOSIS — R937 Abnormal findings on diagnostic imaging of other parts of musculoskeletal system: Secondary | ICD-10-CM | POA: Insufficient documentation

## 2024-08-15 DIAGNOSIS — M5030 Other cervical disc degeneration, unspecified cervical region: Secondary | ICD-10-CM | POA: Diagnosis present

## 2024-08-15 DIAGNOSIS — M48061 Spinal stenosis, lumbar region without neurogenic claudication: Secondary | ICD-10-CM | POA: Diagnosis present

## 2024-08-15 DIAGNOSIS — M79672 Pain in left foot: Secondary | ICD-10-CM | POA: Insufficient documentation

## 2024-08-15 DIAGNOSIS — M25561 Pain in right knee: Secondary | ICD-10-CM | POA: Diagnosis present

## 2024-08-15 DIAGNOSIS — R200 Anesthesia of skin: Secondary | ICD-10-CM | POA: Insufficient documentation

## 2024-08-15 DIAGNOSIS — M47816 Spondylosis without myelopathy or radiculopathy, lumbar region: Secondary | ICD-10-CM | POA: Insufficient documentation

## 2024-08-15 DIAGNOSIS — M5412 Radiculopathy, cervical region: Secondary | ICD-10-CM | POA: Diagnosis present

## 2024-08-15 DIAGNOSIS — E1140 Type 2 diabetes mellitus with diabetic neuropathy, unspecified: Secondary | ICD-10-CM | POA: Diagnosis present

## 2024-08-15 DIAGNOSIS — M7138 Other bursal cyst, other site: Secondary | ICD-10-CM | POA: Diagnosis present

## 2024-08-15 DIAGNOSIS — Z6841 Body Mass Index (BMI) 40.0 and over, adult: Secondary | ICD-10-CM | POA: Diagnosis present

## 2024-08-15 DIAGNOSIS — M79605 Pain in left leg: Secondary | ICD-10-CM | POA: Insufficient documentation

## 2024-08-15 DIAGNOSIS — M5442 Lumbago with sciatica, left side: Secondary | ICD-10-CM | POA: Insufficient documentation

## 2024-08-15 DIAGNOSIS — M25562 Pain in left knee: Secondary | ICD-10-CM | POA: Insufficient documentation

## 2024-08-15 DIAGNOSIS — F1290 Cannabis use, unspecified, uncomplicated: Secondary | ICD-10-CM | POA: Insufficient documentation

## 2024-08-15 DIAGNOSIS — M5416 Radiculopathy, lumbar region: Secondary | ICD-10-CM | POA: Diagnosis present

## 2024-08-15 DIAGNOSIS — M79604 Pain in right leg: Secondary | ICD-10-CM | POA: Insufficient documentation

## 2024-08-15 DIAGNOSIS — M170 Bilateral primary osteoarthritis of knee: Secondary | ICD-10-CM | POA: Insufficient documentation

## 2024-08-15 DIAGNOSIS — M5020 Other cervical disc displacement, unspecified cervical region: Secondary | ICD-10-CM | POA: Insufficient documentation

## 2024-08-15 DIAGNOSIS — G8929 Other chronic pain: Secondary | ICD-10-CM | POA: Insufficient documentation

## 2024-08-15 DIAGNOSIS — M4726 Other spondylosis with radiculopathy, lumbar region: Secondary | ICD-10-CM

## 2024-08-15 DIAGNOSIS — R892 Abnormal level of other drugs, medicaments and biological substances in specimens from other organs, systems and tissues: Secondary | ICD-10-CM | POA: Diagnosis present

## 2024-08-15 DIAGNOSIS — M79671 Pain in right foot: Secondary | ICD-10-CM | POA: Insufficient documentation

## 2024-08-15 DIAGNOSIS — M5459 Other low back pain: Secondary | ICD-10-CM | POA: Diagnosis present

## 2024-08-15 DIAGNOSIS — M5441 Lumbago with sciatica, right side: Secondary | ICD-10-CM | POA: Insufficient documentation

## 2024-08-15 DIAGNOSIS — R202 Paresthesia of skin: Secondary | ICD-10-CM | POA: Diagnosis present

## 2024-08-15 NOTE — Patient Instructions (Signed)
 ______________________________________________________________________    Procedure instructions  Stop blood-thinners  Do not eat or drink fluids (other than water) for 6 hours before your procedure  No water for 2 hours before your procedure  Take your blood pressure medicine with a sip of water  Arrive 30 minutes before your appointment  If sedation is planned, bring suitable driver. [place], [place], & public transportation are NOT APPROVED)  Carefully read the Preparing for your procedure detailed instructions  If you have questions call us  at [phone]  Procedure appointments are for procedures only.   NO medication refills or new problem evaluations will be done on procedure days.   Only the scheduled, pre-approved procedure and side will be done.   ______________________________________________________________________     ______________________________________________________________________    Preparing for your procedure  Appointments: If you think you may not be able to keep your appointment, call 24-48 hours in advance to cancel. We need time to make it available to others.  Procedure visits are for procedures only. During your procedure appointment there will be: NO Prescription Refills*. NO medication changes or discussions*. NO discussion of disability issues*. NO unrelated pain problem evaluations*. NO evaluations to order other pain procedures*. *These will be addressed at a separate and distinct evaluation encounter on the provider's evaluation schedule and not during procedure days.  Instructions: Food intake: Avoid eating anything solid for at least 8 hours prior to your procedure. Clear liquid intake: You may take clear liquids such as water up to 2 hours prior to your procedure. (No carbonated drinks. No soda.) Transportation: Unless otherwise stated by your physician, bring a driver. (Driver cannot be a Market researcher, Pharmacist, community, or any other form of public  transportation.) Morning Medicines: Except for blood thinners, take all of your other morning medications with a sip of water. Make sure to take your heart and blood pressure medicines. If your blood pressure's lower number is above 100, the case will be rescheduled. Blood thinners: Make sure to stop your blood thinners as instructed.  If you take a blood thinner, but were not instructed to stop it, call our office [phone] and ask to talk to a nurse. Not stopping a blood thinner prior to certain procedures could lead to serious complications. Diabetics on insulin : Notify the staff so that you can be scheduled 1st case in the morning. If your diabetes requires high dose insulin , take only  of your normal insulin  dose the morning of the procedure and notify the staff that you have done so. Preventing infections: Shower with an antibacterial soap the morning of your procedure.  Build-up your immune system: Take 1000 mg of Vitamin C with every meal (3 times a day) the day prior to your procedure. Antibiotics: Inform the nursing staff if you are taking any antibiotics or if you have any conditions that may require antibiotics prior to procedures. (Example: recent joint implants)   Pregnancy: If you are pregnant make sure to notify the nursing staff. Not doing so may result in injury to the fetus, including death.  Sickness: If you have a cold, fever, or any active infections, call and cancel or reschedule your procedure. Receiving steroids while having an infection may result in complications. Arrival: You must be in the facility at least 30 minutes prior to your scheduled procedure. Tardiness: Your scheduled time is also the cutoff time. If you do not arrive at least 15 minutes prior to your procedure, you will be rescheduled.  Children: Do not bring any children with  you. Make arrangements to keep them home. Dress appropriately: There is always a possibility that your clothing may get soiled. Avoid  long dresses. Valuables: Do not bring any jewelry or valuables.  Reasons to call and reschedule or cancel your procedure: (Following these recommendations will minimize the risk of a serious complication.) Surgeries: Avoid having procedures within 2 weeks of any surgery. (Avoid for 2 weeks before or after any surgery). Flu Shots: Avoid having procedures within 2 weeks of a flu shots or . (Avoid for 2 weeks before or after immunizations). Barium: Avoid having a procedure within 7-10 days after having had a radiological study involving the use of radiological contrast. (Myelograms, Barium swallow or enema study). Heart attacks: Avoid any elective procedures or surgeries for the initial 6 months after a Myocardial Infarction (Heart Attack). Blood thinners: It is imperative that you stop these medications before procedures. Let us  know if you if you take any blood thinner.  Infection: Avoid procedures during or within two weeks of an infection (including chest colds or gastrointestinal problems). Symptoms associated with infections include: Localized redness, fever, chills, night sweats or profuse sweating, burning sensation when voiding, cough, congestion, stuffiness, runny nose, sore throat, diarrhea, nausea, vomiting, cold or Flu symptoms, recent or current infections. It is specially important if the infection is over the area that we intend to treat. Heart and lung problems: Symptoms that may suggest an active cardiopulmonary problem include: cough, chest pain, breathing difficulties or shortness of breath, dizziness, ankle swelling, uncontrolled high or unusually low blood pressure, and/or palpitations. If you are experiencing any of these symptoms, cancel your procedure and contact your primary care physician for an evaluation.  Remember:  Regular Business hours are:  Monday to Thursday 8:00 AM to 4:00 PM  Provider's Schedule: Eric Como, MD:  Procedure days: Tuesday and Thursday 7:30  AM to 4:00 PM  Wallie Sherry, MD:  Procedure days: Monday and Wednesday 7:30 AM to 4:00 PM Last  Updated: 10/27/2023 ______________________________________________________________________     ______________________________________________________________________    General Risks and Possible Complications  Patient Responsibilities: It is important that you read this as it is part of your informed consent. It is our duty to inform you of the risks and possible complications associated with treatments offered to you. It is your responsibility as a patient to read this and to ask questions about anything that is not clear or that you believe was not covered in this document.  Patient's Rights: You have the right to refuse treatment. You also have the right to change your mind, even after initially having agreed to have the treatment done. However, under this last option, if you wait until the last second to change your mind, you may be charged for the materials used up to that point.  Introduction: Medicine is not an Visual merchandiser. Everything in Medicine, including the lack of treatment(s), carries the potential for danger, harm, or loss (which is by definition: Risk). In Medicine, a complication is a secondary problem, condition, or disease that can aggravate an already existing one. All treatments carry the risk of possible complications. The fact that a side effects or complications occurs, does not imply that the treatment was conducted incorrectly. It must be clearly understood that these can happen even when everything is done following the highest safety standards.  No treatment: You can choose not to proceed with the proposed treatment alternative. The "PRO(s)" would include: avoiding the risk of complications associated with the therapy. The "CON(s)" would include:  not getting any of the treatment benefits. These benefits fall under one of three categories: diagnostic; therapeutic; and/or  palliative. Diagnostic benefits include: getting information which can ultimately lead to improvement of the disease or symptom(s). Therapeutic benefits are those associated with the successful treatment of the disease. Finally, palliative benefits are those related to the decrease of the primary symptoms, without necessarily curing the condition (example: decreasing the pain from a flare-up of a chronic condition, such as incurable terminal cancer).  General Risks and Complications: These are associated to most interventional treatments. They can occur alone, or in combination. They fall under one of the following six (6) categories: no benefit or worsening of symptoms; bleeding; infection; nerve damage; allergic reactions; and/or death. No benefits or worsening of symptoms: In Medicine there are no guarantees, only probabilities. No healthcare provider can ever guarantee that a medical treatment will work, they can only state the probability that it may. Furthermore, there is always the possibility that the condition may worsen, either directly, or indirectly, as a consequence of the treatment. Bleeding: This is more common if the patient is taking a blood thinner, either prescription or over the counter (example: Goody Powders, Fish oil, Aspirin , Garlic, etc.), or if suffering a condition associated with impaired coagulation (example: Hemophilia, cirrhosis of the liver, low platelet counts, etc.). However, even if you do not have one on these, it can still happen. If you have any of these conditions, or take one of these drugs, make sure to notify your treating physician. Infection: This is more common in patients with a compromised immune system, either due to disease (example: diabetes, cancer, human immunodeficiency virus [HIV], etc.), or due to medications or treatments (example: therapies used to treat cancer and rheumatological diseases). However, even if you do not have one on these, it can still  happen. If you have any of these conditions, or take one of these drugs, make sure to notify your treating physician. Nerve Damage: This is more common when the treatment is an invasive one, but it can also happen with the use of medications, such as those used in the treatment of cancer. The damage can occur to small secondary nerves, or to large primary ones, such as those in the spinal cord and brain. This damage may be temporary or permanent and it may lead to impairments that can range from temporary numbness to permanent paralysis and/or brain death. Allergic Reactions: Any time a substance or material comes in contact with our body, there is the possibility of an allergic reaction. These can range from a mild skin rash (contact dermatitis) to a severe systemic reaction (anaphylactic reaction), which can result in death. Death: In general, any medical intervention can result in death, most of the time due to an unforeseen complication. ______________________________________________________________________      ______________________________________________________________________    Steroid injections  Common steroids for injections Triamcinolone : Used by many sports medicine physicians for large joint and bursal injections, often combined with a local anesthetic like lidocaine . A study focusing on coccydynia (tailbone pain) found triamcinolone  was more effective than betamethasone, suggesting it may also be preferable for other localized inflammation conditions. Methylprednisolone : A common alternative to triamcinolone  that is also a strong anti-inflammatory. It is available in different formulations, with the acetate suspension being the long-acting option for intra-articular injections. Dexamethasone: This is a non-particulate steroid, meaning it has a lower risk of tissue damage compared to particulate steroids like triamcinolone  and methylprednisolone . While less common for this specific  use,  it is an option for targeted injections.   Considerations for physicians Particulate vs. non-particulate steroids: Triamcinolone  and methylprednisolone  are particulate, meaning they can clump together. Dexamethasone is non-particulate. Particulate steroids are often preferred for their longer-lasting effects but carry a theoretical higher risk for certain injections (though this is less of a concern in the costochondral joints). Combined injectate: Corticosteroids are typically mixed with a local anesthetic like lidocaine  to provide both immediate pain relief (from the anesthetic) and longer-term inflammation reduction (from the steroid). Imaging guidance: To ensure accurate placement of the needle and medication, physicians may use ultrasound or fluoroscopic guidance for the injection, especially in complex or refractory cases.   Patient guidance Before undergoing a steroid injection, discuss the options with your physician. They will determine the best steroid, dosage, and procedure for your specific case based on factors like: Severity of your condition History of response to other treatments Your overall health status Experience and preference of the physician  Last  Updated: 07/12/2024 ______________________________________________________________________     ______________________________________________________________________    Patient information on: Body mass index (BMI) and Weight Management  Dear Ms. Eduardo you are receiving this information because your weight may be adversely affecting your health.   Your current Estimated body mass index is 43.27 kg/m as calculated from the following:   Height as of this encounter: 5' 5 (1.651 m).   Weight as of this encounter: 260 lb (117.9 kg).  We recommend you talk to your primary care physician about providing or referring you to a supervised weight management program.  Here is some information about weight and the body mass  index (BMI) classification:  BMI is a measure of obesity that's calculated by dividing a person's weight in kilograms by their height in meters squared. A person can use an online calculator to determine their BMI. Body mass index (BMI) is a common tool for deciding whether a person has an appropriate body weight.  It measures a person's weight in relation to their height.  According to the [facility] of health (NIH): A BMI of less than 18.5 means that a person is underweight. A BMI of between 18.5 and 24.9 is ideal. A BMI of between 25 and 29.9 is overweight. A BMI over 30 indicates obesity.  Body Mass Index (BMI) Classification BMI level (kg/m2) Category Associated incidence of chronic pain  <18  Underweight   18.5-24.9 Ideal body weight   25-29.9 Overweight  20%  30-34.9 Obese (Class I)  68%  35-39.9 Severe obesity (Class II)  136%  >40 Extreme obesity (Class III)  254%    Morbidly Obese Classification: You will be considered to be Morbidly Obese if your BMI is above 30 and you have one or more of the following conditions caused or associated to obesity: 1.    Type 2 Diabetes (Leading to cardiovascular diseases (CVD), stroke, peripheral vascular diseases (PVD), retinopathy, nephropathy, and neuropathy) 2.    Cardiovascular Disease (High Blood Pressure; Congestive Heart Failure; High Cholesterol; Coronary Artery Disease; Angina; Arrhythmias, Dysrhythmias, or Heart Attacks) 3.    Breathing problems (Asthma; obesity-hypoventilation syndrome; obstructive sleep apnea; chronic inflammatory airway disease; reactive airway disease; or shortness of breath) 4.    Chronic kidney disease 5.    Liver disease (nonalcoholic fatty liver disease) 6.    High blood pressure 7.    Acid reflux (gastroesophageal reflux disease; heartburn) 8.    Osteoarthritis (OA) (affecting the hip(s), the knee(s) and/or the lower back) (usually requiring knee and/or hip replacements, as  well as back  surgeries) 9.    Low back pain (Lumbar Facet Syndrome; and/or Degenerative Disc Disease) 10.  Hip pain (Osteoarthritis of hip) (For every 1 lbs of added body weight, there is a 2 lbs increase in pressure inside of each hip articulation. 1:2 mechanical relationship) 11.  Knee pain (Osteoarthritis of knee) (For every 1 lbs of added body weight, there is a 4 lbs increase in pressure inside of each knee articulation. 1:4 mechanical relationship) (patients with a BMI>30 kg/m2 were 6.8 times more likely to develop knee OA than normal-weight individuals) 12.  Cancer: Epidemiological studies have shown that obesity is a risk factor for: post-menopausal breast cancer; cancers of the endometrium, colon and kidney cancer; malignant adenomas of the esophagus. Obese subjects have an approximately 1.5-3.5-fold increased risk of developing these cancers compared with normal-weight subjects, and it has been estimated that between 15 and 45% of these cancers can be attributed to overweight. More recent studies suggest that obesity may also increase the risk of other types of cancer, including pancreatic, hepatic and gallbladder cancer. (Ref: Obesity and cancer. Pischon T, Nthlings U, [employer] H. Proc Nutr Soc. 2008 May;67(2):128-45. doi: 10.1017/S0029665108006976.) The International Agency for Research on Cancer (IARC) has identified 13 cancers associated with overweight and obesity: meningioma, multiple myeloma, adenocarcinoma of the esophagus, and cancers of the thyroid, postmenopausal breast cancer, gallbladder, stomach, liver, pancreas, kidney, ovaries, uterus, colon and rectal (colorectal) cancers. 55 percent of all cancers diagnosed in women and 24 percent of those diagnosed in men are associated with overweight and obesity.  Recommendation: If you have any of the above conditions it is urgent that you take a step back and concentrate in losing weight. Dedicate 100% of your efforts on this task. Nothing else will improve  your health more than bringing your weight down and your BMI to less than 30.   Nutritionist and/or supervised weight-management program: We are aware that most chronic pain patients are unable to exercise secondary to their pain. For this reason, you must rely on proper nutrition and diet in order to lose the weight. We recommend you talk to a nutritionist.   Bariatric surgery: A person might be considered a candidate for bariatric surgery if they meet one of the following BMI criteria:  BMI of 40 or higher: This is considered extreme obesity (Class III). BMI of 35-39.9: This is considered obesity, and the person might also have a serious weight-related health condition, such as high blood pressure, type 2 diabetes, or severe sleep apnea  BMI of 30-34.9: This might be considered if the person has serious weight-related health problems and hasn't had substantial weight loss or improvement in co-morbidities through other methods   On your own: A realistic goal is to lose 10% of your body weight over a period of 12 months.  If over a period of six (6) months you have unsuccessfully tried to lose weight, then it is time for you to seek professional help and to enter a medically supervised weight management program, and/or undergo bariatric surgery.   Pain management considerations and possible limitations:  1.    Pharmacological Problems: Be advised that the use of opioid analgesics (oxycodone; hydrocodone; morphine; methadone; codeine; and all of their derivatives) have been associated with decreased metabolism and weight gain.  For this reason, should we see that you are unable to lose weight while taking these medications, it may become necessary for us  to taper down and indefinitely discontinue them.  2.  Technical Problems: The incidence of successful interventional therapies decreases as the patient's BMI increases. It is much more difficult to accomplish a safe and effective interventional therapy  on a patient with a BMI above 35. 3.    Radiation Exposure Problems: The x-rays machine, used to accomplish injection therapies, will automatically increase their x-ray output in order to capture an appropriate bone image. This means that radiation exposure increases exponentially with the patient's BMI. (The higher the BMI, the higher the radiation exposure.) Although the level of radiation used at a given time is still safe to the patient, it is not for the physician and/or assisting staff. Unfortunately, radiation exposure is accumulative. Because physicians and the staff have to do procedures and be exposed on a daily basis, this can result in health problems such as cancer and radiation burns. Radiation exposure to the staff is monitored by the radiation batches that they wear. The exposure levels are reported back to the staff on a quarterly basis. Depending on levels of exposure, physicians and staff may be obligated by law to decrease this exposure. This means that they have the right and obligation to refuse providing therapies where they may be overexposed to radiation. For this reason, physicians may decline to offer therapies such as radiofrequency ablation or implants to patients with a BMI above 40. 4.    Current Trends: Be advised that the current trend is to no longer offer certain therapies to patients with a BMI equal to, or above 35, due to increase perioperative risks, increased technical procedural difficulties, and excessive radiation exposure to healthcare personnel.  Last updated: 08/10/2023 ______________________________________________________________________

## 2024-08-15 NOTE — Progress Notes (Signed)
 PROVIDER NOTE: Interpretation of information contained herein should be left to medically-trained personnel. Specific patient instructions are provided elsewhere under Patient Instructions section of medical record. This document was created in part using AI and STT-dictation technology, any transcriptional errors that may result from this process are unintentional.  Patient: Gabrielle Sampson  Service: E/M   PCP: Eliazar Hart LABOR, MD  DOB: 23-Jul-1971  DOS: 08/15/2024  Provider: Eric LABOR Como, MD  MRN: [id number]  Delivery: Face-to-face  Specialty: Interventional Pain Management  Type: Established Patient  Setting: Ambulatory outpatient facility  Specialty designation: 09  Referring Prov.: Eliazar Hart LABOR, MD  Location: Outpatient office facility       Primary Reason(s) for Visit: Encounter for evaluation before starting new chronic pain management plan of care (Level of risk: moderate) CC: Back Pain, Knee Pain (bilat), and Neck Pain  HPI  Gabrielle Sampson is a 53 y.o. year old, female patient, who comes today for a follow-up evaluation to review the test results and decide on a treatment plan. She has Benign neoplasm of skin; Anxiety; Generalized anxiety disorder with panic attacks; Essential hypertension; Irritable bowel syndrome; PANCREATITIS; Hypertrophic and atrophic condition of skin; Contact urticaria; Disturbance in sleep behavior; Abdominal pain, epigastric; Angioedema; Dysphagia; Benign essential HTN; Type 2 diabetes mellitus with complication, without long-term current use of insulin  (HCC); Morbid obesity with body mass index (BMI) of 40.0 to 44.9 in adult [facility]); Current every day smoker; Elevated hemoglobin A1c; Anal skin tag; Arthritis; At risk for polypharmacy; Pain of left calf; Candidiasis of vagina; Cellulitis; Chronic knee pain (3ry area of Pain) (Bilateral); Cough; Diarrhea; Edema; Encounter for repeat Pap smear due to previous insufficient cervical cells; Endometritis; External  hemorrhoid; Fatigue; GERD (gastroesophageal reflux disease); Hearing loss; Herpes labialis; History of acute pancreatitis; History of MRSA infection; HSV-1 infection; Hyperlipidemia; Internal nasal lesion; Intertrigo; Irregular periods; Left ovarian cyst; Lipoma of left upper extremity; Lower abdominal pain; Menorrhagia; Neck pain on left side; Neoplasm of uncertain behavior; Neuropathy due to type 2 diabetes mellitus (HCC); Pain in pelvis; Chronic feet pain (4th area of Pain) (Bilateral); Painful skin lesion; Restless leg syndrome; Reduced libido; Reactive airway disease without complication; Spinal stenosis of lumbar region with neurogenic claudication; Tobacco use; Trochanteric bursitis of both hips; Vaginal discharge; Varicose vein of leg; Abdominal wall abscess; Abnormal uterine bleeding unrelated to menstrual cycle; Abnormal vaginal bleeding; Acute bacterial bronchitis; Adjustment disorder; Adnexal cyst; Allergic rhinitis; Arm pain, posterior, right; Diabetic retinopathy of both eyes associated with type 2 diabetes mellitus (HCC); H. pylori infection; Pruritus of vagina; Type 2 diabetes mellitus with diabetic polyneuropathy (HCC); Pain of right hip joint; Hypertriglyceridemia; Skin irritation; Wheal; Chronic constipation; Acute pancreatitis without infection or necrosis; Acute sinusitis; Sleep apnea; Angioneurotic edema; Chronic pain syndrome; Pharmacologic therapy; Disorder of skeletal system; Problems influencing health status; Chronic painful diabetic neuropathy (HCC); Abnormal MRI, lumbar spine (06/01/2024); Lumbar lateral recess stenosis (L4-5) (Bilateral) (L>R); Lumbar facet arthropathy; Lumbar nerve root impingement (L5) (Left); Lumbar central spinal stenosis at L4-5 level (Severe); Central spinal stenosis (Severe) (L4-5); 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); Chronic low back pain (1ry area of Pain) (Bilateral) w/ sciatica (Bilateral); Chronic lower extremity pain (2ry area of Pain)  (Bilateral); Low back pain of over 3 months duration; Low back pain potentially associated with radiculopathy; Low back pain radiating to legs (Bilateral); Multifactorial low back pain; Numbness and tingling of feet (Bilateral); Chronic hip pain (Bilateral); History of arthroscopy of knee (Right); Varicose veins with pain; Abnormal drug screen (07/06/2024); Marijuana use;  Lumbar facet hypertrophy (Multilevel) (Bilateral); Lumbar facet joint pain; Lumbar facet joint syndrome; Tricompartment osteoarthritis of knees (Bilateral); Osteoarthritis of knees (Bilateral); Osteoarthritis of facet joint of lumbar spine; Osteoarthritis of lumbar spine; Abnormal MRI, cervical spine (08/10/2024); Cervical disc herniation; Cervical radiculopathy (Left); and DDD (degenerative disc disease), cervical on their problem list. Her primarily concern today is the Back Pain, Knee Pain (bilat), and Neck Pain  Pain Assessment: Location: Lower Back Radiating: through hips and lateral aspect of legs bilat to knees, below which pt c/o numbness to feet Onset: More than a month ago Duration: Chronic pain Quality: Burning, Constant, Dull, Aching Severity: 5 /10 (subjective, self-reported pain score)  Effect on ADL: limits adls Timing: Constant Modifying factors: ibuprofen , marijuana BP: 129/76  HR: 88  Gabrielle Sampson comes in today for a follow-up visit after her initial evaluation on 07/06/2024. Today we went over the results of her tests. These were explained in Layman's terms. During today's appointment we went over my diagnostic impression, as well as the proposed treatment plan.  Review of initial evaluation (07/06/2024): Gabrielle Sampson is a 53 year old female with diabetic neuropathy who presents with lower back pain radiating to her legs.   She experiences a burning sensation in her lower back that radiates down her legs, especially when standing or preparing to sit. The pain is primarily on the right side, with  occasional involvement of the left side. She has diabetic neuropathy affecting her feet, with a sensation of stretched skin in the back of her calves. Previous steroid injections in her back have provided minimal relief.  Review of diagnostic test ordered on 07/06/2024:  Diagnostic lab work: Lab work demonstrated the patient to be having a vitamin D  insufficiency of 26 ng/mL (normal 30-100).  Comprehensive metabolic panel showed a decrease creatinine level with elevated BUN/creatinine ratio and an elevated alkaline phosphatase level C-reactive protein, sed rate, magnesium, vitamin B12 levels were all within normal limits.  Urine drug screening test showed the presence of carboxy THC. Diagnostic imaging: Diagnostic x-rays of both hips demonstrated mild bilateral hip osteoarthritis.  Diagnostic x-rays of the right knee show mild to moderate tricompartmental osteoarthritis most prominent in the medial tibiofemoral compartment.  Small joint effusion.  Diagnostic x-rays of the left knee show mild to moderate tricompartmental osteoarthritis.  Diagnostic x-rays of the lumbar spine with bending views showed a grade 1 anterolisthesis of L4 over L5 with no evidence of instability.  It also demonstrated multilevel facet hypertrophy.  Discussed the use of AI scribe software for clinical note transcription with the patient, who gave verbal consent to proceed.  History of Present Illness   Gabrielle Sampson is a 53 year old female who presents with chronic lower back pain.  She experiences chronic lower back pain in the lumbar region, described as burning and sometimes a dull ache. The pain persists despite Tylenol  or ibuprofen . Epidural steroid injections have provided limited relief. Recent MRI and x-rays show multilevel facet joint hypertrophy and a grade one anterolisthesis of L4 over L5.  She also has knee and hip pain, with persistent knee pain not fully relieved by injections.  Significant cervical  spine issues include disc herniations with extrusion on the left side, causing spinal cord pressure and symptoms in her left arm such as pain, numbness, and weakness. She takes Lyrica 225 mg twice daily for nerve pain, but her symptoms are overwhelming the medication's effectiveness.      Patient presented with interventional treatment options. Gabrielle Sampson was  informed that I will not be providing medication management. Pharmacotherapy evaluation including recommendations may be offered, if specifically requested.   Controlled Substance Pharmacotherapy Assessment REMS (Risk Evaluation and Mitigation Strategy)  Opioid Analgesic: None MME/day: 0 mg/day   Pill Count: None expected due to no prior prescriptions written by our practice. Dayna Pulling, RN  08/15/2024  8:08 AM  Sign when Signing Visit Safety precautions to be maintained throughout the outpatient stay will include: orient to surroundings, keep bed in low position, maintain call bell within reach at all times, provide assistance with transfer out of bed and ambulation.     Pharmacokinetics: Liberation and absorption (onset of action): WNL Distribution (time to peak effect): WNL Metabolism and excretion (duration of action): WNL         Pharmacodynamics: Desired effects: Analgesia: Gabrielle Sampson reports >50% benefit. Functional ability: Patient reports that medication allows her to accomplish basic ADLs Clinically meaningful improvement in function (CMIF): Sustained CMIF goals met Perceived effectiveness: Described as relatively effective, allowing for increase in activities of daily living (ADL) Undesirable effects: Side-effects or Adverse reactions: None reported Monitoring: [place] PMP: PDMP reviewed during this encounter. Online review of the past 11-month period previously conducted. Not applicable at this point since we have not taken over the patient's medication management yet. List of other Serum/Urine Drug Screening Test(s):  No  results found for: AMPHSCRSER, BARBSCRSER, BENZOSCRSER, COCAINSCRSER, COCAINSCRNUR, PCPSCRSER, THCSCRSER, THCU, CANNABQUANT, OPIATESCRSER, OXYSCRSER, PROPOXSCRSER, ETH, CBDTHCR, D8THCCBX, D9THCCBX List of all UDS test(s) done:  Lab Results  Component Value Date   SUMMARY FINAL 07/06/2024   Last UDS on record: Summary  Date Value Ref Range Status  07/06/2024 FINAL  Final    Comment:    ==================================================================== Compliance Drug Analysis, Ur ==================================================================== Test                             Result       Flag       Units  Drug Present and Declared for Prescription Verification   Pregabalin                     PRESENT      EXPECTED   Cyclobenzaprine                PRESENT      EXPECTED   Desmethylcyclobenzaprine       PRESENT      EXPECTED    Desmethylcyclobenzaprine is an expected metabolite of    cyclobenzaprine.    Amitriptyline                  PRESENT      EXPECTED   Nortriptyline                  PRESENT      EXPECTED    Nortriptyline is an expected metabolite of amitriptyline.    Duloxetine                     PRESENT      EXPECTED  Drug Present not Declared for Prescription Verification   Carboxy-THC                    139          UNEXPECTED ng/mg creat    Carboxy-THC is a metabolite of tetrahydrocannabinol (THC). Source of    THC is most commonly herbal marijuana or marijuana-based products,  but THC is also present in a scheduled prescription medication.    Trace amounts of THC can be present in hemp and cannabidiol (CBD)    products. This test is not intended to distinguish between delta-9-    tetrahydrocannabinol, the predominant form of THC in most herbal or    marijuana-based products, and delta-8-tetrahydrocannabinol.  Drug Absent but Declared for Prescription Verification   Citalopram                      Not Detected UNEXPECTED    Lidocaine                       Not Detected UNEXPECTED    Lidocaine , as indicated in the declared medication list, is not    always detected even when used as directed.  ==================================================================== Test                      Result    Flag   Units      Ref Range   Creatinine              41               mg/dL      >=79 ==================================================================== Declared Medications:  The flagging and interpretation on this report are based on the  following declared medications.  Unexpected results may arise from  inaccuracies in the declared medications.   **Note: The testing scope of this panel includes these medications:   Amitriptyline  Cyclobenzaprine (Flexeril)  Duloxetine (Cymbalta)  Escitalopram (Lexapro)  Pregabalin (Lyrica)   **Note: The testing scope of this panel does not include small to  moderate amounts of these reported medications:   Lidocaine  (Xylocaine )   **Note: The testing scope of this panel does not include the  following reported medications:   Atorvastatin (Lipitor)  Famotidine  (Pepcid )  Hydrochlorothiazide (Hydrodiuril)  Losartan (Cozaar)  Metformin  Semaglutide (Ozempic) ==================================================================== For clinical consultation, please call [phone]. ====================================================================    UDS interpretation: Unexpected findings: Undeclared illicit substance detected Medication Assessment Form: Not applicable. No opioids. Treatment compliance: Not applicable Risk Assessment Profile: Aberrant behavior: See initial evaluations. None observed or detected today Comorbid factors increasing risk of overdose: See initial evaluation. No additional risks detected today Opioid risk tool (ORT):     07/06/2024    8:23 AM  Opioid Risk   Alcohol 0  Illegal Drugs 0  Rx Drugs 0  Alcohol 0  Illegal Drugs 0  Rx  Drugs 0  Age between 16-45 years  0  Psychological Disease 0  Depression 1  Opioid Risk Tool Scoring 1  Opioid Risk Interpretation Low Risk    ORT Scoring interpretation table:  Score <3 = Low Risk for SUD  Score between 4-7 = Moderate Risk for SUD  Score >8 = High Risk for Opioid Abuse   Risk of substance use disorder (SUD): Moderate  Risk Mitigation Strategies:  Patient opioid safety counseling: No controlled substances prescribed. Patient-Prescriber Agreement (PPA): No agreement signed.  Controlled substance notification to other providers: None required. No opioid therapy.  Pharmacologic Plan: Non-opioid analgesic therapy offered. Interventional alternatives discussed.             Laboratory Chemistry Profile   Renal Lab Results  Component Value Date   BUN 13 07/06/2024   CREATININE 0.54 (L) 07/06/2024   BCR 24 (H) 07/06/2024   GFRAA >60 12/22/2016   GFRNONAA >60 12/22/2016   PROTEINUR  NEGATIVE 12/22/2016     Electrolytes Lab Results  Component Value Date   NA 140 07/06/2024   K 4.5 07/06/2024   CL 99 07/06/2024   CALCIUM 10.0 07/06/2024   MG 2.2 07/06/2024     Hepatic Lab Results  Component Value Date   AST 17 07/06/2024   ALT 24 12/22/2016   ALBUMIN 4.3 07/06/2024   ALKPHOS 153 (H) 07/06/2024   AMYLASE 47 03/28/2011   LIPASE 43 12/22/2016     ID Lab Results  Component Value Date   SARSCOV2NAA Not Detected 12/07/2019   MRSAPCR NEGATIVE 09/28/2015   PREGTESTUR NEGATIVE 10/25/2015     Bone Lab Results  Component Value Date   25OHVITD1 26 (L) 07/06/2024   25OHVITD2 <1.0 07/06/2024   25OHVITD3 26 07/06/2024     Endocrine Lab Results  Component Value Date   GLUCOSE 88 07/06/2024   GLUCOSEU 50 (A) 12/22/2016     Neuropathy Lab Results  Component Value Date   VITAMINB12 318 07/06/2024     CNS No results found for: COLORCSF, APPEARCSF, RBCCOUNTCSF, WBCCSF, POLYSCSF, LYMPHSCSF, EOSCSF, PROTEINCSF, GLUCCSF, JCVIRUS,  CSFOLI, IGGCSF, LABACHR, ACETBL   Inflammation (CRP: Acute  ESR: Chronic) Lab Results  Component Value Date   CRP 6 07/06/2024   ESRSEDRATE 38 07/06/2024     Rheumatology No results found for: RF, ANA, LABURIC, URICUR, LYMEIGGIGMAB, LYMEABIGMQN, HLAB27   Coagulation Lab Results  Component Value Date   INR 1.1 02/07/2015   LABPROT 14.3 02/07/2015   PLT 249 12/22/2016     Cardiovascular Lab Results  Component Value Date   TROPONINI <0.03 07/13/2016   HGB 11.5 (L) 12/22/2016   HCT 33.8 (L) 12/22/2016     Screening Lab Results  Component Value Date   SARSCOV2NAA Not Detected 12/07/2019   MRSAPCR NEGATIVE 09/28/2015   PREGTESTUR NEGATIVE 10/25/2015     Cancer No results found for: CEA, CA125, LABCA2   Allergens No results found for: ALMOND, APPLE, ASPARAGUS, AVOCADO, BANANA, BARLEY, BASIL, BAYLEAF, GREENBEAN, LIMABEAN, WHITEBEAN, BEEFIGE, REDBEET, BLUEBERRY, BROCCOLI, CABBAGE, MELON, CARROT, CASEIN, CASHEWNUT, CAULIFLOWER, CELERY     Note: Lab results reviewed.  Recent Diagnostic Imaging Review  Cervical Imaging: Cervical MR wo contrast: Results for orders placed during the hospital encounter of 08/09/24 MR CERVICAL SPINE WO CONTRAST  Narrative CLINICAL DATA:  Initial evaluation for chronic neck pain.  EXAM: MRI CERVICAL SPINE WITHOUT CONTRAST  TECHNIQUE: Multiplanar, multisequence MR imaging of the cervical spine was performed. No intravenous contrast was administered.  COMPARISON:  None Available.  FINDINGS: Alignment: Straightening of the normal cervical lordosis. No listhesis.  Vertebrae: Vertebral body height maintained without acute or chronic fracture. Overall bone marrow signal intensity within normal limits. No worrisome osseous lesions. No abnormal marrow edema.  Cord: Normal signal and morphology.  Posterior Fossa, vertebral arteries, paraspinal tissues: Patchy signal  abnormality within the visualized pons, nonspecific, but most commonly related to chronic microvascular ischemic disease. Craniocervical junction within normal limits. Paraspinous soft tissues within normal limits. Normal flow voids seen within the vertebral arteries bilaterally. 4 mm right thyroid nodule noted, of doubtful significance given size and patient age, no follow-up imaging recommended (ref: J Am Coll Radiol. 2015 Feb;12(2): 143-50).  Disc levels:  C2-C3: Normal interspace. Minimal right-sided facet spurring. No canal or foraminal stenosis.  C3-C4: Left-sided uncovertebral spurring without significant disc bulge. No spinal stenosis. Foramina remain adequately patent.  C4-C5: Left paracentral disc protrusion indents the left ventral thecal sac (series 8, image 19). Mild flattening of the left ventral  cord without cord signal changes. No significant spinal stenosis. Mild uncovertebral spurring without significant foraminal encroachment.  C5-C6: Left paracentral disc extrusion with inferior migration indents the left ventral thecal sac, contacting and flattening the left hemi cord (series 8, image 24). No cord signal changes. Resultant mild spinal stenosis. Uncovertebral spurring without significant foraminal encroachment.  C6-C7: Mild disc bulge with uncovertebral spurring. No spinal stenosis. Foramina remain patent.  C7-T1: Left paracentral disc extrusion with inferior migration indents the ventral thecal sac, contacting and flattening the left ventral cord (series 8, image 34). No cord signal changes. No significant spinal stenosis. Foramina remain patent.  T1-2: Unremarkable.  T2-3: Seen only on sagittal projection. Suspected central disc extrusion with superior migration (series 5, image 8). No significant spinal stenosis. Foramina appear patent.  IMPRESSION: 1. Left paracentral disc extrusions with inferior migration at C5-6 and C7-T1 with secondary  flattening of the left hemi cord, but no cord signal changes or significant spinal stenosis. 2. Additional small left paracentral disc protrusion at C4-5 without significant stenosis. 3. Central disc extrusion with superior migration at T2-3 without significant stenosis. 4. Patchy signal abnormality within the visualized pons, nonspecific, but most commonly seen with chronic microvascular ischemic disease. Changes appear advanced for age. Further evaluation with dedicated MRI of the brain could be performed for complete evaluation as warranted.   Electronically Signed By: Morene Hoard M.D. On: 08/10/2024 04:40  Lumbosacral Imaging: Lumbar MR wo contrast: Results for orders placed during the hospital encounter of 05/31/24 MR LUMBAR SPINE WO CONTRAST  Narrative CLINICAL DATA:  Lumbar radiculopathy, symptoms persist with > 6 wks treatment  EXAM: MRI LUMBAR SPINE WITHOUT CONTRAST  TECHNIQUE: Multiplanar, multisequence MR imaging of the lumbar spine was performed. No intravenous contrast was administered.  COMPARISON:  MRI of the lumbar spine dated February 18, 2021.  FINDINGS: Segmentation:  Standard.  Alignment:  Physiologic.  Vertebrae: Hemangiomas within the L1 and L3 vertebral bodies. Normal bone marrow signal otherwise.  Conus medullaris and cauda equina: Conus extends to the L1-2 level. The conus medullaris is unremarkable. There is a central crowding of the cauda equina at L4-5 and there is mild tangling of the nerve roots.  Paraspinal and other soft tissues: Negative.  Disc levels:  L1-2: Normal.  L2-3: Normal.  L3-4: Mild disc bulging and facet hypertrophic changes. No significant spinal canal or neural foraminal stenosis.  L4-5: Mild disc bulging and moderate bilateral facet arthrosis with prominent bony spurring and the synovial cyst on the left, contributing to severe thecal stenosis and likely impingement of the left L5 nerve in the lateral  recess. There is also moderate right lateral recess stenosis. The neural foramina are patent.  L5-S1: Normal.  IMPRESSION: 1. Chronic degenerative disc disease and facet arthrosis at L4-5, with prominent spurring and a small synovial cyst on the left, contributing to severe thecal stenosis and bilateral lateral recess stenosis, with likely impingement of the left L5 nerve.   Electronically Signed By: Evalene Coho M.D. On: 06/01/2024 07:15  Lumbar DG Bending views: Results for orders placed during the hospital encounter of 07/11/24 DG Lumbar Spine Complete W/Bend  Narrative CLINICAL DATA:  Provided history: Abnormal lumbar MRI. Bilateral stenosis of lateral recess of lumbar spine. Facet arthropathy of the lumbar spine. Lumbar nerve root impingement. Spinal stenosis at L4-L5. Central spinal stenosis. Synovial cyst of lumbar facet joint. Chronic bilateral low back pain with bilateral sciatica. Low back pain of over 3 months duration. Low back pain potentially associated with radiculopathy. Low  back pain radiating to both legs. Multifactorial low back pain. Chronic pain of lower extremity, bilateral. Bilateral chronic knee pain. Chronic pain of both feet. Numbness and tingling of both feet. Chronic hip pain.  EXAM: DG LUMBAR SPINE COMPLETE W/ BEND  COMPARISON:  Lumbar MRI 05/31/2024  FINDINGS: Soft tissue attenuation from habitus limits detailed assessment. There are 5 non-rib-bearing lumbar vertebra. 8 mm anterolisthesis of L4 on L5. Limited range of motion on flexion and extension, but no evidence of instability. Anterior spurring L1-L2, L3-L4, L4-L5, and L5-S1. L3-L4 and L4-L5 facet hypertrophy. Hemangiomas on prior MRI are not well demonstrated by radiograph. Vascular calcifications in the left upper quadrant  IMPRESSION: 1. Grade 1 anterolisthesis of L4 on L5. No evidence of instability. 2. Multilevel facet hypertrophy.   Electronically Signed By: Andrea Gasman M.D. On: 07/18/2024 23:11  Hip Imaging: Hip-B DG Bilateral (5V): Results for orders placed during the hospital encounter of 07/11/24 DG HIPS BILAT W OR W/O PELVIS MIN 5 VIEWS  Narrative CLINICAL DATA:  Chronic bilateral hip pain.  EXAM: DG HIP (WITH OR WITHOUT PELVIS) 5+V BILAT  COMPARISON:  None Available.  FINDINGS: Mild bilateral hip joint space narrowing with acetabular spurring. Femoral heads are well seated. No fracture. No evidence of erosion, avascular necrosis, or focal bone lesion. Sacroiliac joints are congruent. No focal soft tissue abnormalities.  IMPRESSION: Mild bilateral hip osteoarthritis.   Electronically Signed By: Andrea Gasman M.D. On: 07/18/2024 23:11   Knee Imaging: Knee-R MR wo contrast: Results for orders placed during the hospital encounter of 01/01/16 MR Knee Right Wo Contrast  Narrative CLINICAL DATA:  Medial knee pain in weakness over the past 4 months. Prior knee surgery 4 years ago.  EXAM: MRI OF THE RIGHT KNEE WITHOUT CONTRAST  TECHNIQUE: Multiplanar, multisequence MR imaging of the knee was performed. No intravenous contrast was administered.  COMPARISON:  11/24/2009  FINDINGS: MENISCI  Medial meniscus: Very diminutive rim of residual tissue in the midbody and adjacent posterior horn medial meniscus, representing either tear or partial meniscectomy since the prior exam of 11/24/2009.  Lateral meniscus: Mild diminution in size of the posterior horn lateral meniscus compared to previous, again probably from partial meniscectomy, less likely due to an otherwise occult tear.  LIGAMENTS  Cruciates:  Unremarkable  Collaterals:  Unremarkable  CARTILAGE  Patellofemoral: Moderate degenerative chondral thinning and chondral irregularity in the femoral trochlear groove, lateral patellar facet, and posterior patellar ridge. Marginal spurring.  Medial: Moderate degenerative chondral thinning and  chondral irregularity with marginal spurring.  Lateral: Moderate degenerative chondral thinning and chondral irregularity with both marginal spurring and articular spurring along the lateral femoral condyle.  Joint:  Small knee effusion.  Popliteal Fossa:  Unremarkable  Extensor Mechanism:  Unremarkable  Bones: Red marrow noted. This is mildly prominent for age and may be a response to anemia.  IMPRESSION: 1. Diminution in size of the midbody and posterior horn of the medial meniscus, probably from partial meniscectomy, less likely tear. 2. Similar appearance of the mild reduction in size of the posterior horn lateral meniscus, favor partial meniscectomy, less likely tear, correlate with prior operative intervention. 3. Moderate degenerative chondral thinning and tricompartmental chondral irregularity and spurring. 4. Small knee effusion. 5. Mildly prominent red marrow for age, probably a response to anemia.   Electronically Signed By: Ryan Salvage M.D. On: 01/01/2016 09:50  Knee-R DG 4 views: Results for orders placed during the hospital encounter of 07/11/24 DG Knee Complete 4 Views Right  Narrative CLINICAL  DATA:  Right knee pain/arthralgia.  History of arthroscopy.  EXAM: RIGHT KNEE - COMPLETE 4+ VIEW  COMPARISON:  None Available.  FINDINGS: Medial tibiofemoral joint space narrowing. Mild to moderate tricompartmental peripheral spurring. Spurring of the tibial spines. No fracture, erosion, or focal bone abnormality. Small joint effusion. Subcutaneous soft tissue calcification medially in the distal thigh.  IMPRESSION: Mild to moderate tricompartmental osteoarthritis, most prominent in the medial tibiofemoral compartment. Small joint effusion.   Electronically Signed By: Andrea Gasman M.D. On: 07/18/2024 23:07  Knee-L DG 4 views: Results for orders placed during the hospital encounter of 07/11/24 DG Knee Complete 4 Views  Left  Narrative CLINICAL DATA:  Left knee pain/arthralgia.  EXAM: LEFT KNEE - COMPLETE 4+ VIEW  COMPARISON:  None Available.  FINDINGS: Medial tibiofemoral joint space narrowing. Mild to moderate tricompartmental peripheral spurring. No fracture, erosion, or focal bone abnormality. No joint effusion. Unremarkable soft tissues.  IMPRESSION: Mild to moderate tricompartmental osteoarthritis.   Electronically Signed By: Andrea Gasman M.D. On: 07/18/2024 23:12  Complexity Note: Imaging results reviewed.                         Meds   Current Outpatient Medications:    albuterol  (VENTOLIN  HFA) 108 (90 Base) MCG/ACT inhaler, Inhale 2 puffs into the lungs every 6 (six) hours as needed for wheezing or shortness of breath., Disp: 8 g, Rfl: 0   amitriptyline (ELAVIL) 50 MG tablet, Take 50 mg by mouth at bedtime., Disp: , Rfl:    atorvastatin (LIPITOR) 80 MG tablet, Take 80 mg by mouth daily., Disp: , Rfl:    cyclobenzaprine (FLEXERIL) 10 MG tablet, Take 10 mg by mouth 2 (two) times daily as needed., Disp: , Rfl:    DULoxetine (CYMBALTA) 60 MG capsule, Take 60 mg by mouth daily., Disp: , Rfl:    famotidine  (PEPCID ) 20 MG tablet, Take 20 mg by mouth 2 (two) times daily., Disp: , Rfl:    fluticasone  (FLONASE ) 50 MCG/ACT nasal spray, Place 2 sprays into both nostrils daily., Disp: 16 g, Rfl: 0   hydrochlorothiazide (HYDRODIURIL) 25 MG tablet, Take 25 mg by mouth daily., Disp: , Rfl:    lidocaine  (XYLOCAINE ) 5 % ointment, 2 (two) times daily as needed., Disp: , Rfl:    losartan (COZAAR) 25 MG tablet, Take 25 mg by mouth daily., Disp: , Rfl:    metFORMIN (GLUCOPHAGE-XR) 500 MG 24 hr tablet, Take 1,000 mg by mouth 2 (two) times daily., Disp: , Rfl:    OZEMPIC, 1 MG/DOSE, 4 MG/3ML SOPN, Inject 1 mg into the skin., Disp: , Rfl:    pregabalin (LYRICA) 225 MG capsule, Take 225 mg by mouth 2 (two) times daily., Disp: , Rfl:    amoxicillin -clavulanate (AUGMENTIN ) 875-125 MG tablet, Take 1 tablet  by mouth 2 (two) times daily., Disp: 14 tablet, Rfl: 0  ROS  Constitutional: Denies any fever or chills Gastrointestinal: No reported hemesis, hematochezia, vomiting, or acute GI distress Musculoskeletal: Denies any acute onset joint swelling, redness, loss of ROM, or weakness Neurological: No reported episodes of acute onset apraxia, aphasia, dysarthria, agnosia, amnesia, paralysis, loss of coordination, or loss of consciousness  Allergies  Gabrielle Sampson is allergic to lisinopril, niacin, sulfa antibiotics, betadine [povidone iodine], codeine phosphate, empagliflozin, iodine, and morphine and codeine.  PFSH  Drug: Gabrielle Sampson  reports no history of drug use. Alcohol:  reports no history of alcohol use. Tobacco:  reports that she has been smoking cigarettes. She started  smoking about 31 years ago. She has a 10 pack-year smoking history. She has never used smokeless tobacco. Medical:  has a past medical history of Anxiety, Diabetes (HCC), Fatigue, HBP (high blood pressure), Morbid obesity (HCC), Neuropathy, Osteoarthritis, Pancreatitis, and Reflux. Surgical: Gabrielle Sampson  has a past surgical history that includes Cesarean section (1996 AND 1999); Gallbladder surgery (1996); Knee surgery (Right, 2012); Tonsillectomy and adenoidectomy; Anal fissure repair; Abscess drainage; Esophagogastroduodenoscopy (egd) with propofol  (N/A, 10/25/2015); Tubal ligation; and Cardiac catheterization. Family: family history includes CAD in her father; Hypothyroidism in her mother; Melanoma in her mother.  Constitutional Exam  General appearance: Well nourished, well developed, and well hydrated. In no apparent acute distress Vitals:   08/15/24 0811  BP: 129/76  Pulse: 88  Resp: (!) 165  Temp: (!) 97.5 F (36.4 C)  SpO2: 100%  Weight: 260 lb (117.9 kg)  Height: 5' 5 (1.651 m)   BMI Assessment: Estimated body mass index is 43.27 kg/m as calculated from the following:   Height as of this encounter: 5' 5 (1.651  m).   Weight as of this encounter: 260 lb (117.9 kg).  BMI interpretation table: BMI level Category Range association with higher incidence of chronic pain  <18 kg/m2 Underweight   18.5-24.9 kg/m2 Ideal body weight   25-29.9 kg/m2 Overweight Increased incidence by 20%  30-34.9 kg/m2 Obese (Class I) Increased incidence by 68%  35-39.9 kg/m2 Severe obesity (Class II) Increased incidence by 136%  >40 kg/m2 Extreme obesity (Class III) Increased incidence by 254%   Patient's current BMI Ideal Body weight  Body mass index is 43.27 kg/m. Ideal body weight: 57 kg (125 lb 10.6 oz) Adjusted ideal body weight: 81.4 kg (179 lb 6.4 oz)   BMI Readings from Last 4 Encounters:  08/15/24 43.27 kg/m  08/08/24 43.23 kg/m  08/03/24 43.60 kg/m  07/06/24 44.16 kg/m   Wt Readings from Last 4 Encounters:  08/15/24 260 lb (117.9 kg)  08/08/24 263 lb 12.8 oz (119.7 kg)  08/03/24 262 lb (118.8 kg)  07/06/24 265 lb 6.4 oz (120.4 kg)    Psych/Mental status: Alert, oriented x 3 (person, place, & time)       Eyes: PERLA Respiratory: No evidence of acute respiratory distress  Assessment & Plan  Primary Diagnosis & Pertinent Problem List: The primary encounter diagnosis was Chronic low back pain (1ry area of Pain) (Bilateral) w/ sciatica (Bilateral). Diagnoses of Chronic lower extremity pain (2ry area of Pain) (Bilateral), Chronic knee pain (3ry area of Pain) (Bilateral), Chronic feet pain (4th area of Pain) (Bilateral), Lumbar central spinal stenosis at L4-5 level (Severe), 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), Lumbar lateral recess stenosis (L4-5) (Bilateral) (L>R), Lumbar nerve root impingement (L5) (Left), Lumbar facet hypertrophy (Multilevel) (Bilateral), Lumbar facet joint pain, Lumbar facet joint syndrome, Tricompartment osteoarthritis of knees (Bilateral), Osteoarthritis of knees (Bilateral), Osteoarthritis of facet joint of lumbar spine, Primary osteoarthritis of lumbar spine, Morbid  obesity with body mass index (BMI) of 40.0 to 44.9 in adult [facility]), Neuropathy due to type 2 diabetes mellitus (HCC), Numbness and tingling of feet (Bilateral), Abnormal drug screen (07/06/2024), Marijuana use, Abnormal MRI, lumbar spine (06/01/2024), Abnormal MRI, cervical spine (08/10/2024), Cervical disc herniation, Cervical radiculopathy (Left), and DDD (degenerative disc disease), cervical were also pertinent to this visit. Visit Diagnosis: 1. Chronic low back pain (1ry area of Pain) (Bilateral) w/ sciatica (Bilateral)   2. Chronic lower extremity pain (2ry area of Pain) (Bilateral)   3. Chronic knee pain (3ry area of Pain) (  Bilateral)   4. Chronic feet pain (4th area of Pain) (Bilateral)   5. Lumbar central spinal stenosis at L4-5 level (Severe)   6. 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)   7. Lumbar lateral recess stenosis (L4-5) (Bilateral) (L>R)   8. Lumbar nerve root impingement (L5) (Left)   9. Lumbar facet hypertrophy (Multilevel) (Bilateral)   10. Lumbar facet joint pain   11. Lumbar facet joint syndrome   12. Tricompartment osteoarthritis of knees (Bilateral)   13. Osteoarthritis of knees (Bilateral)   14. Osteoarthritis of facet joint of lumbar spine   15. Primary osteoarthritis of lumbar spine   16. Morbid obesity with body mass index (BMI) of 40.0 to 44.9 in adult (HCC)   17. Neuropathy due to type 2 diabetes mellitus (HCC)   18. Numbness and tingling of feet (Bilateral)   19. Abnormal drug screen (07/06/2024)   20. Marijuana use   21. Abnormal MRI, lumbar spine (06/01/2024)   22. Abnormal MRI, cervical spine (08/10/2024)   23. Cervical disc herniation   24. Cervical radiculopathy (Left)   25. DDD (degenerative disc disease), cervical    Problems updated and reviewed during this visit: Problem  Lumbar facet hypertrophy (Multilevel) (Bilateral)  Lumbar Facet Joint Pain  Lumbar Facet Joint Syndrome  Tricompartment osteoarthritis of knees (Bilateral)  Osteoarthritis  of knees (Bilateral)  Osteoarthritis of Facet Joint of Lumbar Spine  Osteoarthritis of lumbar spine  Abnormal MRI, cervical spine (08/10/2024)   (08/10/2024) CERVICAL SPINE MRI FINDINGS: Alignment: Straightening of the normal cervical lordosis. No listhesis.   Vertebrae: Vertebral body height maintained without acute or chronic fracture. Overall bone marrow signal intensity within normal limits. No worrisome osseous lesions. No abnormal marrow edema.   Cord: Normal signal and morphology.   Posterior Fossa, vertebral arteries, paraspinal tissues: Patchy signal abnormality within the visualized pons, nonspecific, but most commonly related to chronic microvascular ischemic disease. Craniocervical junction within normal limits. Paraspinous soft tissues within normal limits. Normal flow voids seen within the vertebral arteries bilaterally. 4 mm right thyroid nodule noted, of doubtful significance given size and patient age, no follow-up imaging recommended (ref: J Am Coll Radiol. 2015 Feb;12(2): 143-50).   DISC LEVELS:   C2-C3: Normal interspace. Minimal right-sided facet spurring. No canal or foraminal stenosis.   C3-C4: Left-sided uncovertebral spurring without significant disc bulge. No spinal stenosis. Foramina remain adequately patent.   C4-C5: Left paracentral disc protrusion indents the left ventral thecal sac (series 8, image 19). Mild flattening of the left ventral cord without cord signal changes. No significant spinal stenosis. Mild uncovertebral spurring without significant foraminal encroachment.   C5-C6: Left paracentral disc extrusion with inferior migration indents the left ventral thecal sac, contacting and flattening the left hemi cord (series 8, image 24). No cord signal changes. Resultant mild spinal stenosis. Uncovertebral spurring without significant foraminal encroachment.   C6-C7: Mild disc bulge with uncovertebral spurring. No spinal stenosis. Foramina  remain patent.   C7-T1: Left paracentral disc extrusion with inferior migration indents the ventral thecal sac, contacting and flattening the left ventral cord (series 8, image 34). No cord signal changes. No significant spinal stenosis. Foramina remain patent.   T1-2: Unremarkable.   T2-3: Seen only on sagittal projection. Suspected central disc extrusion with superior migration (series 5, image 8). No significant spinal stenosis. Foramina appear patent.   IMPRESSION: 1. Left paracentral disc extrusions with inferior migration at C5-6 and C7-T1 with secondary flattening of the left hemi cord, but no cord signal changes or significant  spinal stenosis. 2. Additional small left paracentral disc protrusion at C4-5 without significant stenosis. 3. Central disc extrusion with superior migration at T2-3 without significant stenosis. 4. Patchy signal abnormality within the visualized pons, nonspecific, but most commonly seen with chronic microvascular ischemic disease. Changes appear advanced for age. Further evaluation with dedicated MRI of the brain could be performed for complete evaluation as warranted.   Cervical Disc Herniation  Cervical radiculopathy (Left)  Ddd (Degenerative Disc Disease), Cervical  Abnormal MRI, lumbar spine (06/01/2024)   (06/01/2024) LUMBAR SPINE MRI FINDINGS: Segmentation:  Standard. Alignment:  Physiologic. Vertebrae: Hemangiomas within the L1 and L3 vertebral bodies. Normal bone marrow signal otherwise. Conus medullaris and cauda equina: Conus extends to the L1-2 level. The conus medullaris is unremarkable. There is a central crowding of the cauda equina at L4-5 and there is mild tangling of the nerve roots. Paraspinal and other soft tissues: Negative.  DISC LEVELS: L1-2: Normal. L2-3: Normal. L3-4: Mild disc bulging and facet hypertrophic changes. No significant spinal canal or neural foraminal stenosis. L4-5: Mild disc bulging and moderate bilateral  facet arthrosis with prominent bony spurring and the synovial cyst on the left, contributing to severe thecal stenosis and likely impingement of the left L5 nerve in the lateral recess. There is also moderate right lateral recess stenosis. The neural foramina are patent. L5-S1: Normal.  IMPRESSION: 1. Chronic degenerative disc disease and facet arthrosis at L4-5, with prominent spurring and a small synovial cyst on the left, contributing to severe thecal stenosis and bilateral lateral recess stenosis, with likely impingement of the left L5 nerve.   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 This may cause symptoms in the left arm and potentially the lower body. Schedule a cervical epidural steroid injection and provide a pain diary to track changes post-injection. Discuss potential surgical intervention with a spine surgeon.  Lumbar facet joint hypertrophy with multilevel spondylosis and grade 1 anterolisthesis at L4-L5   Imaging shows bilateral facet joint hypertrophy from L3 to L5, multilevel spondylosis, and grade 1 anterolisthesis at L4-L5, consistent with chronic back pain and possible leg pain. Consider facet joint injections for diagnostic and therapeutic purposes.  Lumbar radiculopathy and referred pain   Referred pain from lumbar facet joint hypertrophy and potential foraminal stenosis may extend from the lower back to the buttocks and down the leg, typically not reaching the foot unless nerve irritation occurs.  Tricompartmental osteoarthritis of both knees   X-rays indicate  tricompartmental osteoarthritis in both knees, contributing to knee pain. High BMI exacerbates the condition, and weight loss may provide partial relief. Pain may also be referred from lumbar spine issues.  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 The condition is not severe, and weight management is crucial to slow progression.  Morbid obesity (BMI >40)   Current BMI exceeds 40, previously over 50. Significant weight loss has been achieved, but further reduction is necessary to reduce joint stress and slow osteoarthritis progression. Continue efforts to lower BMI below 30.  Type 2 diabetes mellitus, improved control   A1c has decreased from 11 to 6.5, indicating improved glycemic control. Continued management is essential to prevent complications.  Chronic microvascular ischemia of the brain   Chronic microvascular ischemia is likely due to small vessel disease, with risk factors including smoking and high cholesterol. Management includes daily baby aspirin , cholesterol management, and smoking cessation.        Pharmacotherapy (Medications Ordered): No orders of the defined types were placed in this encounter.  Procedure Orders         Cervical Epidural Injection     Orders Placed This Encounter  Procedures   Cervical Epidural Injection    Sedation: Patient's choice. Purpose: Diagnostic/Therapeutic Indication(s): Radiculitis and cervicalgia associater with cervical degenerative disc disease.    Standing Status:   Future    Expiration Date:   11/14/2024    Scheduling Instructions:     Procedure: Cervical Epidural Steroid Injection/Block     Level(s): C7-T1     Laterality: Left-sided     Timeframe: ASAP    Where will this procedure be performed?:   [facility]             by Dr. Tanya   Lab Orders  No laboratory test(s) ordered today   Imaging Orders  No imaging  studies ordered today   Referral Orders  No referral(s) requested today    Pharmacological management:  Opioid Analgesics: I will not be prescribing any opioids at this time Membrane stabilizer: I will not be prescribing any at this time Muscle relaxant: I will not be prescribing any at this time NSAID: I will not be prescribing any at this time Other analgesic(s): I will not be prescribing any at this time      Interventional Therapies  Risk Factors  Considerations  Medical Comorbidities:  ALLERGY: Betadine, Iodine, Codeine, Morphine  MO (BMI>40)  T2IIDDM  HTN  OSA  Tobacco use/Smoker  GERD  GAD w/ Panic Attacks  IBS     Planned  Pending:   Diagnostic/therapeutic left cervical ESI #1    Under consideration:   Diagnostic/therapeutic left cervical ESI #1  Diagnostic/therapeutic midline interlaminar L3-4 vs L4-5 LESI #1  Diagnostic/therapeutic bilateral lumbar facet MBB #1  Patient must bring BMI down to <30 kg/m   Completed: (Analgesic benefit)1  None at this time   Therapeutic  Palliative (PRN) options:   None established   Completed by other providers: (See under care everywhere x-rays)  Therapeutic right L5-S1 TFESI x1 (05/29/2021) by Telhan, Gaurav, MD [facility])  Therapeutic right L4-5 facet joint cyst aspiration/inj. x1 (04/23/2020) by Janith Franky Faden, DO [facility])   1(Analgesic benefit): Expressed in percentage (%). (Local anesthetic[LA] +/- sedation  L.A.Local Anesthetic  Steroid benefit  Ongoing benefit)      Provider-requested follow-up: Return for (ECT): (L) CESI #1. Recent Visits Date Type Provider Dept  07/06/24 Office Visit Tanya Glisson, MD [facility]  Showing recent visits within past 90 days and meeting all other requirements Today's Visits Date Type Provider Dept  08/15/24 Office Visit Tanya Glisson, MD [facility]  Showing today's visits and meeting all other  requirements Future Appointments No visits were found meeting these conditions. Showing future appointments within next 90 days and meeting all other requirements   Primary Care Physician: Eliazar Hart LABOR, MD  Duration of encounter: 44 minutes.  Total time on encounter, as per AMA guidelines included both the face-to-face and non-face-to-face time personally  spent by the physician and/or other qualified health care professional(s) on the day of the encounter (includes time in activities that require the physician or other qualified health care professional and does not include time in activities normally performed by clinical staff). Physician's time may include the following activities when performed: Preparing to see the patient (e.g., pre-charting review of records, searching for previously ordered imaging, lab work, and nerve conduction tests) Review of prior analgesic pharmacotherapies. Reviewing PMP Interpreting ordered tests (e.g., lab work, imaging, nerve conduction tests) Performing post-procedure evaluations, including interpretation of diagnostic procedures Obtaining and/or reviewing separately obtained history Performing a medically appropriate examination and/or evaluation Counseling and educating the patient/family/caregiver Ordering medications, tests, or procedures Referring and communicating with other health care professionals (when not separately reported) Documenting clinical information in the electronic or other health record Independently interpreting results (not separately reported) and communicating results to the patient/ family/caregiver Care coordination (not separately reported)  Note by: Eric DELENA Como, MD (TTS technology used. I apologize for any typographical errors that were not detected and corrected.) Date: 08/15/2024; Time: 9:31 AM

## 2024-08-15 NOTE — Progress Notes (Signed)
 Safety precautions to be maintained throughout the outpatient stay will include: orient to surroundings, keep bed in low position, maintain call bell within reach at all times, provide assistance with transfer out of bed and ambulation.

## 2024-08-17 NOTE — Progress Notes (Unsigned)
 My Chart Video Visit- Progress Note: Referring Physician:  Eliazar Hart LABOR, MD [address] [place],  [place] [address]  Primary Physician:  Eliazar Hart LABOR, MD  This visit was performed via MyChart/video.   Patient location: home Provider location: working from home  I spent a total of 30 minutes non-face-to-face activities for this visit on the date of this encounter including review of current clinical condition and response to treatment.    Patient has given verbal consent to this MyChart video visit and we reviewed the limitations of a MyChart video visit. Patient wishes to proceed.    Chief Complaint:  review cervical imaging  History of Present Illness: Gabrielle Sampson is a 53 y.o. female has a history of  HTN, reactive airway disease, IBS, pancreatitis, GERD, DM with neuropathy, depression, hyperlipidemia, obesity, RLS.   Last seen by me on 08/03/24 for constant LBP with intermittent bilateral leg pain. 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 Also with mild right foraminal stenosis L5-S1.   She saw Dr. Naveira on 08/15/24- she is being scheduled for left C7-T1 IL ESI on 09/20/24.   She also had numbness/tingling in hands with weakness and dexterity issues. She had intermittent neck pain with no arm pain.   MyChart visit scheduled to review her cervical MRI.   She continues with intermittent numbness/tingling in her hands. She has been dropping things. She is having new dexterity issues. She has intermittent neck pain with no arm pain. She notes balance issues.      She continues with constant LBP that can be more of a burning sensation. She now has bilateral intermittent leg pain from her calf to toes. Pain is worse with standing and walking. She has intermittent numbness and tingling in both legs. Pain is better with sitting or laying flat.    Her  last HgbA1c on 08/04/24 was 6.5.    No relief with previous lumbar injections in 2022 and 2023.    She smokes 1/2 ppd x 30 years.    Bowel/Bladder Dysfunction: none   Conservative measures:  Physical therapy: [facility] initial eval 06/15/24- last visit was 07-16-2024 due to death in the family. She has restarted it.  Multimodal medical therapy including regular antiinflammatories: Flexeril, Oxycodone, Lidocaine  patches, Tylenol , Cymbalta, Lyrica  Injections:  L4-L5 IL ESI 02/17/22 [facility] L4-L5 [facility]   Past Surgery: none   Exam: General: Patient is well developed, well nourished, calm, collected, and in no apparent distress. Attention to examination is appropriate.  Respiratory: Patient is breathing without any difficulty.    Awake, alert, oriented to person, place, and time.  Speech is clear and fluent. Fund of knowledge is appropriate.    Imaging: Cervical MRI dated 08/09/24:  FINDINGS: Alignment: Straightening of the normal cervical lordosis. No listhesis.   Vertebrae: Vertebral body height maintained without acute or chronic fracture. Overall bone marrow signal intensity within normal limits. No worrisome osseous lesions. No abnormal marrow edema.   Cord: Normal signal and morphology.   Posterior Fossa, vertebral arteries, paraspinal tissues: Patchy signal abnormality within the visualized pons, nonspecific, but most commonly related to chronic microvascular ischemic disease. Craniocervical junction within normal limits. Paraspinous soft tissues within normal limits. Normal flow voids seen within the vertebral arteries bilaterally. 4 mm right thyroid nodule noted, of doubtful significance given size and patient age, no follow-up imaging recommended (ref: J Am  Coll Radiol. 2015 Feb;12(2): 143-50).   Disc levels:   C2-C3: Normal interspace. Minimal right-sided facet spurring. No canal or foraminal stenosis.   C3-C4: Left-sided uncovertebral spurring  without significant disc bulge. No spinal stenosis. Foramina remain adequately patent.   C4-C5: Left paracentral disc protrusion indents the left ventral thecal sac (series 8, image 19). Mild flattening of the left ventral cord without cord signal changes. No significant spinal stenosis. Mild uncovertebral spurring without significant foraminal encroachment.   C5-C6: Left paracentral disc extrusion with inferior migration indents the left ventral thecal sac, contacting and flattening the left hemi cord (series 8, image 24). No cord signal changes. Resultant mild spinal stenosis. Uncovertebral spurring without significant foraminal encroachment.   C6-C7: Mild disc bulge with uncovertebral spurring. No spinal stenosis. Foramina remain patent.   C7-T1: Left paracentral disc extrusion with inferior migration indents the ventral thecal sac, contacting and flattening the left ventral cord (series 8, image 34). No cord signal changes. No significant spinal stenosis. Foramina remain patent.   T1-2: Unremarkable.   T2-3: Seen only on sagittal projection. Suspected central disc extrusion with superior migration (series 5, image 8). No significant spinal stenosis. Foramina appear patent.   IMPRESSION: 1. Left paracentral disc extrusions with inferior migration at C5-6 and C7-T1 with secondary flattening of the left hemi cord, but no cord signal changes or significant spinal stenosis. 2. Additional small left paracentral disc protrusion at C4-5 without significant stenosis. 3. Central disc extrusion with superior migration at T2-3 without significant stenosis. 4. Patchy signal abnormality within the visualized pons, nonspecific, but most commonly seen with chronic microvascular ischemic disease. Changes appear advanced for age. Further evaluation with dedicated MRI of the brain could be performed for complete evaluation as warranted.     Electronically Signed   By: Morene Hoard M.D.   On: 08/10/2024 04:40  I have personally reviewed the images and agree with the above interpretation.  Assessment and Plan: Ms. Oneil continues with primary complaint of constant LBP. She now has bilateral intermittent leg pain from her calf to toes. Pain is worse with standing and walking. She has intermittent numbness and tingling in both legs.   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 Also with mild right foraminal stenosis L5-S1.   She still has intermittent numbness/tingling in her hands. She has been dropping things. She is having dexterity issues. She has intermittent neck pain with no arm pain. She notes balance issues.      She has known cervical spondylosis with mild central stenosis C5-C6 and mild left foraminal stenosis. Also with disc bulge noted at T2-T3 with no central stenosis.   These findings would not fully explain symptoms in her upper extremities.   Cervical MRI also showed patchy signal abnormality within the visualized pons, nonspecific, but most commonly seen with chronic microvascular ischemic disease. Changes appear advanced for age.   Treatment options discussed with patient and following plan made:   - Scheduled for C7-T1 IL ESI with Dr. Tanya in November. Message sent to him regarding this, not sure it will help numbness/tingling. Will also ask about possible lumbar injections.  - Consider upper extremity EMG at some point. Will hold off for now.  - Continue with PT for her lumbar spine.  - Continue to work on weight loss. Goal prior to surgery is 210lbs. She is still at 260lbs.  - She would need to  quit smoking prior to any surgery.  - Of note, last HgbA1c on 08/04/24 was 6.5.  - No further workup recommended for 4 mm right thyroid nodule. She will discuss further with PCP.  - As above, cervical MRI showed patchy signal  abnormality within the visualized pons, nonspecific, but most commonly seen with chronic microvascular ischemic disease. Changes appear advanced for age. Will get MRI of brain to evaluate further. Depending on results, will likely refer to neurology (she would like to see Dr. Maree).  - Will do MyChart visit to review her brain MRI once I have results.   Glade Boys PA-C Neurosurgery

## 2024-08-19 ENCOUNTER — Encounter: Payer: Self-pay | Admitting: Orthopedic Surgery

## 2024-08-19 ENCOUNTER — Telehealth: Admitting: Orthopedic Surgery

## 2024-08-19 DIAGNOSIS — M47812 Spondylosis without myelopathy or radiculopathy, cervical region: Secondary | ICD-10-CM

## 2024-08-19 DIAGNOSIS — M5416 Radiculopathy, lumbar region: Secondary | ICD-10-CM

## 2024-08-19 DIAGNOSIS — M7138 Other bursal cyst, other site: Secondary | ICD-10-CM

## 2024-08-19 DIAGNOSIS — M48062 Spinal stenosis, lumbar region with neurogenic claudication: Secondary | ICD-10-CM | POA: Diagnosis not present

## 2024-08-19 DIAGNOSIS — M47816 Spondylosis without myelopathy or radiculopathy, lumbar region: Secondary | ICD-10-CM

## 2024-08-19 DIAGNOSIS — R200 Anesthesia of skin: Secondary | ICD-10-CM

## 2024-08-19 DIAGNOSIS — M4316 Spondylolisthesis, lumbar region: Secondary | ICD-10-CM

## 2024-08-19 DIAGNOSIS — M4802 Spinal stenosis, cervical region: Secondary | ICD-10-CM | POA: Diagnosis not present

## 2024-08-19 DIAGNOSIS — M4726 Other spondylosis with radiculopathy, lumbar region: Secondary | ICD-10-CM

## 2024-08-19 DIAGNOSIS — R278 Other lack of coordination: Secondary | ICD-10-CM

## 2024-08-25 ENCOUNTER — Ambulatory Visit
Admission: RE | Admit: 2024-08-25 | Discharge: 2024-08-25 | Disposition: A | Discharge status: Home / Self Care | Source: Ambulatory Visit | Attending: Orthopedic Surgery | Admitting: Orthopedic Surgery

## 2024-08-25 DIAGNOSIS — R278 Other lack of coordination: Secondary | ICD-10-CM | POA: Diagnosis present

## 2024-08-25 DIAGNOSIS — R202 Paresthesia of skin: Secondary | ICD-10-CM | POA: Insufficient documentation

## 2024-08-25 DIAGNOSIS — R200 Anesthesia of skin: Secondary | ICD-10-CM | POA: Diagnosis present

## 2024-08-25 DIAGNOSIS — M47812 Spondylosis without myelopathy or radiculopathy, cervical region: Secondary | ICD-10-CM | POA: Insufficient documentation

## 2024-08-26 ENCOUNTER — Telehealth (INDEPENDENT_AMBULATORY_CARE_PROVIDER_SITE_OTHER): Admitting: Orthopedic Surgery

## 2024-08-26 ENCOUNTER — Encounter: Payer: Self-pay | Admitting: Orthopedic Surgery

## 2024-08-26 DIAGNOSIS — M4316 Spondylolisthesis, lumbar region: Secondary | ICD-10-CM

## 2024-08-26 DIAGNOSIS — M4802 Spinal stenosis, cervical region: Secondary | ICD-10-CM

## 2024-08-26 DIAGNOSIS — I679 Cerebrovascular disease, unspecified: Secondary | ICD-10-CM

## 2024-08-26 DIAGNOSIS — M48062 Spinal stenosis, lumbar region with neurogenic claudication: Secondary | ICD-10-CM

## 2024-08-26 DIAGNOSIS — M47812 Spondylosis without myelopathy or radiculopathy, cervical region: Secondary | ICD-10-CM | POA: Diagnosis not present

## 2024-08-26 DIAGNOSIS — R200 Anesthesia of skin: Secondary | ICD-10-CM

## 2024-08-26 DIAGNOSIS — M47816 Spondylosis without myelopathy or radiculopathy, lumbar region: Secondary | ICD-10-CM

## 2024-08-26 DIAGNOSIS — M4726 Other spondylosis with radiculopathy, lumbar region: Secondary | ICD-10-CM

## 2024-08-26 DIAGNOSIS — M48061 Spinal stenosis, lumbar region without neurogenic claudication: Secondary | ICD-10-CM

## 2024-08-26 DIAGNOSIS — M5416 Radiculopathy, lumbar region: Secondary | ICD-10-CM

## 2024-08-26 DIAGNOSIS — M7130 Other bursal cyst, unspecified site: Secondary | ICD-10-CM

## 2024-08-26 NOTE — Addendum Note (Signed)
 Addended by: HILMA HASTINGS on: 08/26/2024 11:17 AM   Modules accepted: Level of Service

## 2024-08-26 NOTE — Progress Notes (Signed)
 My Chart Video Visit- Progress Note: Referring Physician:  Eliazar Hart LABOR, MD [address] [place],  [place] [address]  Primary Physician:  Eliazar Hart LABOR, MD  This visit was performed via MyChart/video.   Patient location: home Provider location: working from home  I spent a total of 12 minutes non-face-to-face activities for this visit on the date of this encounter including review of current clinical condition and response to treatment.    Patient has given verbal consent to this MyChart video visit and we reviewed the limitations of a MyChart video visit. Patient wishes to proceed.    Chief Complaint:  review imaging  History of Present Illness: Gabrielle Sampson is a 53 y.o. female has a history of  HTN, reactive airway disease, IBS, pancreatitis, GERD, DM with neuropathy, depression, hyperlipidemia, obesity, RLS.   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 Also with mild right foraminal stenosis L5-S1.    She has known cervical spondylosis with mild central stenosis C5-C6 and mild left foraminal stenosis. Also with disc bulge noted at T2-T3 with no central stenosis.   Message sent to Dr. Naveira about her cervical injection- I have not heard back yet.   Video visit scheduled to review her brain MRI.   She has appointment scheduled to see Neurology [facility]) on 09/19/24- was referred for cervical spine.   No real changes to her symptoms since her last visit. Still with numbness/tingling in hands. She has intermittent neck pain with no arm pain.   Also with constant LBP with bilateral intermittent leg pain from her calf to toes. Pain is worse with standing and walking. She has intermittent numbness and tingling in both legs.   She does not want to do cervical ESI, would like to try repeat lumbar MRI.    Her last HgbA1c on 08/04/24 was 6.5.    No  relief with previous lumbar injections in 2022 and 2023.    She smokes 1/2 ppd x 30 years.    Bowel/Bladder Dysfunction: none   Conservative measures:  Physical therapy: [facility] initial eval 06/15/24- last visit was 2024/07/31 due to death in the family. She has restarted it.  Multimodal medical therapy including regular antiinflammatories: Flexeril, Oxycodone, Lidocaine  patches, Tylenol , Cymbalta, Lyrica  Injections:  L4-L5 IL ESI 02/17/22 [facility] L4-L5 IL [facility]   Past Surgery: none   Exam: General: Patient is well developed, well nourished, calm, collected, and in no apparent distress. Attention to examination is appropriate.  Respiratory: Patient is breathing without any difficulty.    Awake, alert, oriented to person, place, and time.  Speech is clear and fluent. Fund of knowledge is appropriate.    Imaging: Brain MRI dated 08/25/24:  FINDINGS: Brain:   Cerebral volume is normal.   Moderate patchy and ill-defined T2 FLAIR hyperintense signal abnormality within the pons, nonspecific but most often secondary to chronic small vessel ischemia.   Partially empty sella turcica.   No cortical encephalomalacia is identified.   There is no acute infarct.   No evidence of an intracranial mass.   No chronic intracranial blood products.   No extra-axial fluid collection.   No midline shift.   Vascular: Maintained flow voids within the proximal large arterial vessels.   Skull and upper cervical spine: No focal worrisome marrow lesion.   Sinuses/Orbits: No mass or acute finding within the imaged orbits. Mucous retention cysts  and/or polyps within the right maxillary sinus measuring up to 14 mm. Mild right frontoethmoidal sinus disease. Minimal mucosal thickening within the left frontal sinus inferiorly.   IMPRESSION: 1. No evidence of an acute intracranial abnormality. 2. Moderate patchy and ill-defined T2 FLAIR hyperintense signal abnormality within the  pons, nonspecific but most often secondary to chronic small vessel ischemia. 3. Paranasal sinus disease as described.     Electronically Signed   By: Rockey Childs D.O.   On: 08/25/2024 08:05  I have personally reviewed the images and agree with the above interpretation.  Assessment and Plan: [name] continues with primary complaint of constant LBP with bilateral intermittent leg pain from her calf to toes. Pain is worse with standing and walking. She has intermittent numbness and tingling in both legs.   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 Also with mild right foraminal stenosis L5-S1.   She still has intermittent numbness/tingling in her hands. She has been dropping things. She is having dexterity issues. She has intermittent neck pain with no arm pain. She notes balance issues.      She has known cervical spondylosis with mild central stenosis C5-C6 and mild left foraminal stenosis. Also with disc bulge noted at T2-T3 with no central stenosis.   MRI of brain shows moderate patchy and ill-defined T2 FLAIR hyperintense signal abnormality within the pons, nonspecific but most often secondary to chronic small vessel ischemia.  Treatment options discussed with patient and following plan made:   - Referral to [facility] neurology for chronic small vessel ischemia. She has appt with Lane on 11/3 will see if he can see her for this at that visit.  - Scheduled for C7-T1 IL ESI with Dr. Tanya in November. Message sent to him regarding this, not sure it will help numbness/tingling. She does not want to do cervical ESI. Will also ask about possible lumbar injections. Waiting to hear back.  - Consider upper extremity EMG at some point. Will hold off for now.  - Continue with PT for her lumbar spine.  - Continue to work on weight loss. Goal prior to surgery is 210lbs. She is still at  260lbs.  - She would need to quit smoking prior to any surgery.  - Of note, last HgbA1c on 08/04/24 was 6.5.  - Will follow up once I hear back from Dr. Naveira and schedule follow up.   Glade Boys PA-C Neurosurgery

## 2024-08-29 ENCOUNTER — Other Ambulatory Visit: Payer: Self-pay | Admitting: Pain Medicine

## 2024-08-29 DIAGNOSIS — R937 Abnormal findings on diagnostic imaging of other parts of musculoskeletal system: Secondary | ICD-10-CM

## 2024-08-29 DIAGNOSIS — M48061 Spinal stenosis, lumbar region without neurogenic claudication: Secondary | ICD-10-CM

## 2024-08-29 DIAGNOSIS — G8929 Other chronic pain: Secondary | ICD-10-CM

## 2024-08-29 DIAGNOSIS — M5416 Radiculopathy, lumbar region: Secondary | ICD-10-CM

## 2024-08-30 ENCOUNTER — Encounter: Payer: Self-pay | Admitting: Orthopedic Surgery

## 2024-08-30 NOTE — Telephone Encounter (Signed)
 I have tried reaching out to pain management multiple times and have not heard back.   Patient sent message advising her to contact them.

## 2024-08-31 NOTE — Telephone Encounter (Signed)
 I heard back from pain management. They will change injection in November to a lumbar injection.

## 2024-09-09 DIAGNOSIS — R9089 Other abnormal findings on diagnostic imaging of central nervous system: Secondary | ICD-10-CM | POA: Insufficient documentation

## 2024-09-11 ENCOUNTER — Telehealth: Admitting: Nurse Practitioner

## 2024-09-11 DIAGNOSIS — J208 Acute bronchitis due to other specified organisms: Secondary | ICD-10-CM | POA: Diagnosis not present

## 2024-09-11 MED ORDER — PREDNISONE 20 MG PO TABS: 20.0000 mg | ORAL_TABLET | Freq: Every day | ORAL | 0 refills | Status: AC

## 2024-09-11 NOTE — Patient Instructions (Signed)
 Gabrielle Sampson, thank you for joining H[facility] [name], NP for today's virtual visit.  While this provider is not your primary care provider (PCP), if your PCP is located in our provider database this encounter information will be shared with them immediately following your visit.   A [facility] MyChart account gives you access to today's visit and all your visits, tests, and labs performed at [facility]  click here if you don't have a [facility] MyChart account or go to [email or web address]  Consent: (Patient) Gabrielle Sampson provided verbal consent for this virtual visit at the beginning of the encounter.  Current Medications:  Current Outpatient Medications:    predniSONE  (DELTASONE ) 20 MG tablet, Take 1 tablet (20 mg total) by mouth daily with breakfast for 5 days., Disp: 5 tablet, Rfl: 0   albuterol  (VENTOLIN  HFA) 108 (90 Base) MCG/ACT inhaler, Inhale 2 puffs into the lungs every 6 (six) hours as needed for wheezing or shortness of breath., Disp: 8 g, Rfl: 0   amitriptyline (ELAVIL) 50 MG tablet, Take 50 mg by mouth at bedtime., Disp: , Rfl:    atorvastatin (LIPITOR) 80 MG tablet, Take 80 mg by mouth daily., Disp: , Rfl:    cyclobenzaprine (FLEXERIL) 10 MG tablet, Take 10 mg by mouth 2 (two) times daily as needed., Disp: , Rfl:    DULoxetine (CYMBALTA) 60 MG capsule, Take 60 mg by mouth daily., Disp: , Rfl:    famotidine  (PEPCID ) 20 MG tablet, Take 20 mg by mouth 2 (two) times daily., Disp: , Rfl:    fluticasone  (FLONASE ) 50 MCG/ACT nasal spray, Place 2 sprays into both nostrils daily., Disp: 16 g, Rfl: 0   hydrochlorothiazide (HYDRODIURIL) 25 MG tablet, Take 25 mg by mouth daily., Disp: , Rfl:    lidocaine  (XYLOCAINE ) 5 % ointment, 2 (two) times daily as needed., Disp: , Rfl:    losartan (COZAAR) 25 MG tablet, Take 25 mg by mouth daily., Disp: , Rfl:    metFORMIN (GLUCOPHAGE-XR) 500 MG 24 hr tablet, Take 1,000 mg by mouth 2 (two) times daily., Disp:  , Rfl:    OZEMPIC, 1 MG/DOSE, 4 MG/3ML SOPN, Inject 1 mg into the skin., Disp: , Rfl:    pregabalin (LYRICA) 225 MG capsule, Take 225 mg by mouth 2 (two) times daily., Disp: , Rfl:    Medications ordered in this encounter:  Meds ordered this encounter  Medications   predniSONE  (DELTASONE ) 20 MG tablet    Sig: Take 1 tablet (20 mg total) by mouth daily with breakfast for 5 days.    Dispense:  5 tablet    Refill:  0    Supervising Provider:   BLAISE ALEENE KIDD [[id number]]     *If you need refills on other medications prior to your next appointment, please contact your pharmacy*  Follow-Up: Call back or seek an in-person evaluation if the symptoms worsen or if the condition fails to improve as anticipated.  [facility] [phone]  Other Instructions Follow up with possible abx if no improvement in 3 days    If you have been instructed to have an in-person evaluation today at a local Urgent Care facility, please use the link below. It will take you to a list of all of our available [facility], including address, phone number and hours of operation. Please do not delay care.   Urgent Cares  If you or a family member do not have a primary care provider, use the  link below to schedule a visit and establish care. When you choose a [facility] primary care physician or advanced practice provider, you gain a long-term partner in health. Find a Primary Care Provider  Learn more about [facility]'s in-office and virtual care options: [facility] Get Care Now

## 2024-09-11 NOTE — Progress Notes (Signed)
 Virtual Visit Consent   Gabrielle Sampson, you are scheduled for a virtual visit with a [facility] provider today. Just as with appointments in the office, your consent must be obtained to participate. Your consent will be active for this visit and any virtual visit you may have with one of our providers in the next 365 days. If you have a MyChart account, a copy of this consent can be sent to you electronically.  As this is a virtual visit, video technology does not allow for your provider to perform a traditional examination. This may limit your provider's ability to fully assess your condition. If your provider identifies any concerns that need to be evaluated in person or the need to arrange testing (such as labs, EKG, etc.), we will make arrangements to do so. Although advances in technology are sophisticated, we cannot ensure that it will always work on either your end or our end. If the connection with a video visit is poor, the visit may have to be switched to a telephone visit. With either a video or telephone visit, we are not always able to ensure that we have a secure connection.  By engaging in this virtual visit, you consent to the provision of healthcare and authorize for your insurance to be billed (if applicable) for the services provided during this visit. Depending on your insurance coverage, you may receive a charge related to this service.  I need to obtain your verbal consent now. Are you willing to proceed with your visit today? Gabrielle Sampson has provided verbal consent on 09/11/2024 for a virtual visit (video or telephone). Gabrielle [name], NP  Date: 09/11/2024 1:35 PM   Virtual Visit via Video Note   I, Gabrielle Sampson, connected with  Gabrielle Sampson  ([id number], 1971-01-25) on 09/11/24 at  1:15 PM EDT by a video-enabled telemedicine application and verified that I am speaking with the correct person using two identifiers.  Location: Patient:  Virtual Visit Location Patient: Home Provider: Virtual Visit Location Provider: Home Office   I discussed the limitations of evaluation and management by telemedicine and the availability of in person appointments. The patient expressed understanding and agreed to proceed.    History of Present Illness: Gabrielle Sampson is a 53 y.o. who identifies as a female who was assigned female at birth, and is being seen today for viral bronchitis.  Gabrielle Sampson has been experiencing the following symptom over the past 4 days: wheezing, shortness of breath, vocal changes, worsening cough and tightness in her chest. She is a current smoker and has been using her albuterol  inhaler and flonase  with little relief. She was treated with abx last month for presumed bacterial bronchitis.   Problems:  Patient Active Problem List   Diagnosis Date Noted   Abnormal drug screen (07/06/2024) 08/15/2024   Marijuana use 08/15/2024   Lumbar facet hypertrophy (Multilevel) (Bilateral) 08/15/2024   Lumbar facet joint pain 08/15/2024   Lumbar facet joint syndrome 08/15/2024   Tricompartment osteoarthritis of knees (Bilateral) 08/15/2024   Osteoarthritis of knees (Bilateral) 08/15/2024   Osteoarthritis of facet joint of lumbar spine 08/15/2024   Osteoarthritis of lumbar spine 08/15/2024   Abnormal MRI, cervical spine (08/10/2024) 08/15/2024   Cervical disc herniation 08/15/2024   Cervical radiculopathy (Left) 08/15/2024   DDD (degenerative disc disease), cervical 08/15/2024   Varicose veins with pain 08/08/2024   Chronic pain syndrome 07/06/2024   Pharmacologic therapy 07/06/2024   Disorder of skeletal system 07/06/2024  Problems influencing health status 07/06/2024   Chronic painful diabetic neuropathy (HCC) 07/06/2024   Abnormal MRI, lumbar spine (06/01/2024) 07/06/2024   Lumbar lateral recess stenosis (L4-5) (Bilateral) (L>R) 07/06/2024   Lumbar facet arthropathy 07/06/2024   Lumbar nerve root impingement  (L5) (Left) 07/06/2024   Lumbar central spinal stenosis at L4-5 level (Severe) 07/06/2024   Central spinal stenosis (Severe) (L4-5) 07/06/2024   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) 07/06/2024   Chronic low back pain (1ry area of Pain) (Bilateral) w/ sciatica (Bilateral) 07/06/2024   Chronic lower extremity pain (2ry area of Pain) (Bilateral) 07/06/2024   Low back pain of over 3 months duration 07/06/2024   Low back pain potentially associated with radiculopathy 07/06/2024   Low back pain radiating to legs (Bilateral) 07/06/2024   Multifactorial low back pain 07/06/2024   Numbness and tingling of feet (Bilateral) 07/06/2024   Chronic hip pain (Bilateral) 07/06/2024   History of arthroscopy of knee (Right) 07/06/2024   Abnormal uterine bleeding unrelated to menstrual cycle 07/05/2024   Abnormal vaginal bleeding 07/05/2024   Adjustment disorder 07/05/2024   Pruritus of vagina 07/05/2024   Hypertriglyceridemia 07/05/2024   Skin irritation 07/05/2024   Wheal 07/05/2024   Acute sinusitis 07/05/2024   Acute bacterial bronchitis 01/31/2024   Diabetic retinopathy of both eyes associated with type 2 diabetes mellitus (HCC) 12/29/2023   Arm pain, posterior, right 09/10/2023   H. pylori infection 09/10/2023   Elevated hemoglobin A1c 06/29/2023   Arthritis 06/29/2023   Candidiasis of vagina 06/29/2023   Cellulitis 06/29/2023   Endometritis 06/29/2023   Herpes labialis 06/29/2023   Irregular periods 06/29/2023   Lower abdominal pain 06/29/2023   Menorrhagia 06/29/2023   Pain in pelvis 06/29/2023   Reduced libido 06/29/2023   Vaginal discharge 06/29/2023   Neuropathy due to type 2 diabetes mellitus (HCC) 04/09/2023   Diarrhea 01/31/2023   Chronic constipation 01/28/2023   At risk for polypharmacy 11/25/2022   Painful skin lesion 11/25/2022   Hearing loss 08/22/2022   Neck pain on left side 07/16/2022   Tobacco use 05/01/2022   Encounter for repeat Pap smear due to previous  insufficient cervical cells 01/16/2022   Anal skin tag 08/27/2021   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   External hemorrhoid 03/26/2021   Internal nasal lesion 03/26/2021   Left ovarian cyst 03/26/2021   Pain of left calf 02/07/2021   Cough 09/03/2020   Neoplasm of uncertain behavior 07/24/2020   Fatigue 06/29/2020   Hyperlipidemia 06/29/2020   Lipoma of left upper extremity 03/07/2020   Chronic feet pain (4th area of Pain) (Bilateral) 03/07/2020   History of acute pancreatitis 01/26/2020   Intertrigo 12/09/2019   Chronic knee pain (3ry area of Pain) (Bilateral) 11/28/2019   Spinal stenosis of lumbar region with neurogenic claudication 11/04/2019   Current every day smoker 06/27/2019   Edema 06/02/2019   Pain of right hip joint 06/02/2019   Adnexal cyst 09/22/2018   Varicose vein of leg 07/16/2016   Dysphagia 09/30/2015   Benign essential HTN 09/30/2015   Type 2 diabetes mellitus with complication, without long-term current use of insulin  (HCC) 09/30/2015   Angioedema 09/28/2015   Angioneurotic edema 09/28/2015   GERD (gastroesophageal reflux disease) 09/07/2015   Reactive airway disease without complication 07/13/2015   Trochanteric bursitis of both hips 06/23/2015   Type 2 diabetes mellitus with diabetic polyneuropathy (HCC) 06/23/2015   Restless leg syndrome 02/28/2015   Abdominal wall abscess 02/22/2015   History of MRSA infection 01/27/2015   Morbid obesity  with body mass index (BMI) of 40.0 to 44.9 in adult [facility]) 08/23/2014   Allergic rhinitis 11/27/2010   Sleep apnea 07/17/2009   Abdominal pain, epigastric 06/18/2007   PANCREATITIS 05/10/2007   Contact urticaria 05/10/2007   Acute pancreatitis without infection or necrosis 05/10/2007   Anxiety 04/29/2007   Generalized anxiety disorder with panic attacks 04/29/2007   Essential hypertension 04/29/2007   Benign neoplasm of skin 02/22/2007   Irritable bowel syndrome 02/22/2007   Hypertrophic and atrophic condition of skin  02/22/2007   Disturbance in sleep behavior 02/22/2007    Allergies:  Allergies  Allergen Reactions   Lisinopril Swelling and Anaphylaxis   Niacin Rash   Sulfa Antibiotics Hives   Betadine [Povidone Iodine] Hives   Codeine Phosphate Nausea And Vomiting   Empagliflozin Other (See Comments)    Contraindicated due to hx of groin infection   Iodine Hives   Morphine And Codeine    Medications:  Current Outpatient Medications:    predniSONE  (DELTASONE ) 20 MG tablet, Take 1 tablet (20 mg total) by mouth daily with breakfast for 5 days., Disp: 5 tablet, Rfl: 0   albuterol  (VENTOLIN  HFA) 108 (90 Base) MCG/ACT inhaler, Inhale 2 puffs into the lungs every 6 (six) hours as needed for wheezing or shortness of breath., Disp: 8 g, Rfl: 0   amitriptyline (ELAVIL) 50 MG tablet, Take 50 mg by mouth at bedtime., Disp: , Rfl:    atorvastatin (LIPITOR) 80 MG tablet, Take 80 mg by mouth daily., Disp: , Rfl:    cyclobenzaprine (FLEXERIL) 10 MG tablet, Take 10 mg by mouth 2 (two) times daily as needed., Disp: , Rfl:    DULoxetine (CYMBALTA) 60 MG capsule, Take 60 mg by mouth daily., Disp: , Rfl:    famotidine  (PEPCID ) 20 MG tablet, Take 20 mg by mouth 2 (two) times daily., Disp: , Rfl:    fluticasone  (FLONASE ) 50 MCG/ACT nasal spray, Place 2 sprays into both nostrils daily., Disp: 16 g, Rfl: 0   hydrochlorothiazide (HYDRODIURIL) 25 MG tablet, Take 25 mg by mouth daily., Disp: , Rfl:    lidocaine  (XYLOCAINE ) 5 % ointment, 2 (two) times daily as needed., Disp: , Rfl:    losartan (COZAAR) 25 MG tablet, Take 25 mg by mouth daily., Disp: , Rfl:    metFORMIN (GLUCOPHAGE-XR) 500 MG 24 hr tablet, Take 1,000 mg by mouth 2 (two) times daily., Disp: , Rfl:    OZEMPIC, 1 MG/DOSE, 4 MG/3ML SOPN, Inject 1 mg into the skin., Disp: , Rfl:    pregabalin (LYRICA) 225 MG capsule, Take 225 mg by mouth 2 (two) times daily., Disp: , Rfl:   Observations/Objective: Patient is well-developed, well-nourished in no acute distress.   Resting comfortably at home.  Head is normocephalic, atraumatic.  No labored breathing.  Speech is clear and coherent with logical content.  Patient is alert and oriented at baseline.    Assessment and Plan: 1. Acute viral bronchitis (Primary) - predniSONE  (DELTASONE ) 20 MG tablet; Take 1 tablet (20 mg total) by mouth daily with breakfast for 5 days.  Dispense: 5 tablet; Refill: 0  Follow up with possible abx if no improvement in 3 days  Follow Up Instructions: I discussed the assessment and treatment plan with the patient. The patient was provided an opportunity to ask questions and all were answered. The patient agreed with the plan and demonstrated an understanding of the instructions.  A copy of instructions were sent to the patient via MyChart unless otherwise noted below.  The patient was advised to call back or seek an in-person evaluation if the symptoms worsen or if the condition fails to improve as anticipated.    Tyqwan Pink W Journie Howson, NP

## 2024-09-18 ENCOUNTER — Telehealth: Admitting: Family

## 2024-09-18 DIAGNOSIS — J208 Acute bronchitis due to other specified organisms: Secondary | ICD-10-CM | POA: Diagnosis not present

## 2024-09-18 DIAGNOSIS — B9689 Other specified bacterial agents as the cause of diseases classified elsewhere: Secondary | ICD-10-CM

## 2024-09-18 MED ORDER — BENZONATATE 100 MG PO CAPS: 100.0000 mg | ORAL_CAPSULE | Freq: Three times a day (TID) | ORAL | 0 refills | Status: DC | PRN

## 2024-09-18 MED ORDER — AZITHROMYCIN 250 MG PO TABS: ORAL_TABLET | ORAL | 0 refills | Status: DC

## 2024-09-18 MED ORDER — PROMETHAZINE-DM 6.25-15 MG/5ML PO SYRP: 5.0000 mL | ORAL_SOLUTION | Freq: Three times a day (TID) | ORAL | 0 refills | Status: AC | PRN

## 2024-09-18 NOTE — Progress Notes (Signed)
 Virtual Visit Consent   Suleyma Wafer, you are scheduled for a virtual visit with a [facility] provider today. Just as with appointments in the office, your consent must be obtained to participate. Your consent will be active for this visit and any virtual visit you may have with one of our providers in the next 365 days. If you have a MyChart account, a copy of this consent can be sent to you electronically.  As this is a virtual visit, video technology does not allow for your provider to perform a traditional examination. This may limit your provider's ability to fully assess your condition. If your provider identifies any concerns that need to be evaluated in person or the need to arrange testing (such as labs, EKG, etc.), we will make arrangements to do so. Although advances in technology are sophisticated, we cannot ensure that it will always work on either your end or our end. If the connection with a video visit is poor, the visit may have to be switched to a telephone visit. With either a video or telephone visit, we are not always able to ensure that we have a secure connection.  By engaging in this virtual visit, you consent to the provision of healthcare and authorize for your insurance to be billed (if applicable) for the services provided during this visit. Depending on your insurance coverage, you may receive a charge related to this service.  I need to obtain your verbal consent now. Are you willing to proceed with your visit today? Indira Sorenson has provided verbal consent on 09/18/2024 for a virtual visit (video or telephone). Bari Learn, FNP  Date: 09/18/2024 12:49 PM   Virtual Visit via Video Note   I, Bari Learn, connected with  Gabrielle Sampson  ([id number], [birth year]) on 09/18/24 at 12:45 PM EST by a video-enabled telemedicine application and verified that I am speaking with the correct person using two identifiers.  Location: Patient:  Virtual Visit Location Patient: Home Provider: Virtual Visit Location Provider: Home Office   I discussed the limitations of evaluation and management by telemedicine and the availability of in person appointments. The patient expressed understanding and agreed to proceed.    History of Present Illness: Gabrielle Sampson is a 53 y.o. who identifies as a female who was assigned female at birth, and is being seen today for cough that has been on going for over a three weeks.  HPI: Cough This is a new problem. The current episode started 1 to 4 weeks ago. The problem has been unchanged. The problem occurs every few minutes. The cough is Productive of sputum, productive of brown sputum and productive of purulent sputum. Associated symptoms include nasal congestion, shortness of breath and wheezing. Pertinent negatives include no ear congestion, ear pain, fever, headaches, myalgias or postnasal drip. Risk factors for lung disease include smoking/tobacco exposure. She has tried rest for the symptoms. The treatment provided mild relief.    Problems:  Patient Active Problem List   Diagnosis Date Noted   Abnormal drug screen (07/06/2024) 08/15/2024   Marijuana use 08/15/2024   Lumbar facet hypertrophy (Multilevel) (Bilateral) 08/15/2024   Lumbar facet joint pain 08/15/2024   Lumbar facet joint syndrome 08/15/2024   Tricompartment osteoarthritis of knees (Bilateral) 08/15/2024   Osteoarthritis of knees (Bilateral) 08/15/2024   Osteoarthritis of facet joint of lumbar spine 08/15/2024   Osteoarthritis of lumbar spine 08/15/2024   Abnormal MRI, cervical spine (08/10/2024) 08/15/2024   Cervical disc herniation 08/15/2024  Cervical radiculopathy (Left) 08/15/2024   DDD (degenerative disc disease), cervical 08/15/2024   Varicose veins with pain 08/08/2024   Chronic pain syndrome 07/06/2024   Pharmacologic therapy 07/06/2024   Disorder of skeletal system 07/06/2024   Problems influencing health  status 07/06/2024   Chronic painful diabetic neuropathy (HCC) 07/06/2024   Abnormal MRI, lumbar spine (06/01/2024) 07/06/2024   Lumbar lateral recess stenosis (L4-5) (Bilateral) (L>R) 07/06/2024   Lumbar facet arthropathy 07/06/2024   Lumbar nerve root impingement (L5) (Left) 07/06/2024   Lumbar central spinal stenosis at L4-5 level (Severe) 07/06/2024   Central spinal stenosis (Severe) (L4-5) 07/06/2024   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) 07/06/2024   Chronic low back pain (1ry area of Pain) (Bilateral) w/ sciatica (Bilateral) 07/06/2024   Chronic lower extremity pain (2ry area of Pain) (Bilateral) 07/06/2024   Low back pain of over 3 months duration 07/06/2024   Low back pain potentially associated with radiculopathy 07/06/2024   Low back pain radiating to legs (Bilateral) 07/06/2024   Multifactorial low back pain 07/06/2024   Numbness and tingling of feet (Bilateral) 07/06/2024   Chronic hip pain (Bilateral) 07/06/2024   History of arthroscopy of knee (Right) 07/06/2024   Abnormal uterine bleeding unrelated to menstrual cycle 07/05/2024   Abnormal vaginal bleeding 07/05/2024   Adjustment disorder 07/05/2024   Pruritus of vagina 07/05/2024   Hypertriglyceridemia 07/05/2024   Skin irritation 07/05/2024   Wheal 07/05/2024   Acute sinusitis 07/05/2024   Acute bacterial bronchitis 01/31/2024   Diabetic retinopathy of both eyes associated with type 2 diabetes mellitus (HCC) 12/29/2023   Arm pain, posterior, right 09/10/2023   H. pylori infection 09/10/2023   Elevated hemoglobin A1c 06/29/2023   Arthritis 06/29/2023   Candidiasis of vagina 06/29/2023   Cellulitis 06/29/2023   Endometritis 06/29/2023   Herpes labialis 06/29/2023   Irregular periods 06/29/2023   Lower abdominal pain 06/29/2023   Menorrhagia 06/29/2023   Pain in pelvis 06/29/2023   Reduced libido 06/29/2023   Vaginal discharge 06/29/2023   Neuropathy due to type 2 diabetes mellitus (HCC) 04/09/2023    Diarrhea 01/31/2023   Chronic constipation 01/28/2023   At risk for polypharmacy 11/25/2022   Painful skin lesion 11/25/2022   Hearing loss 08/22/2022   Neck pain on left side 07/16/2022   Tobacco use 05/01/2022   Encounter for repeat Pap smear due to previous insufficient cervical cells 01/16/2022   Anal skin tag 08/27/2021   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   External hemorrhoid 03/26/2021   Internal nasal lesion 03/26/2021   Left ovarian cyst 03/26/2021   Pain of left calf 02/07/2021   Cough 09/03/2020   Neoplasm of uncertain behavior 07/24/2020   Fatigue 06/29/2020   Hyperlipidemia 06/29/2020   Lipoma of left upper extremity 03/07/2020   Chronic feet pain (4th area of Pain) (Bilateral) 03/07/2020   History of acute pancreatitis 01/26/2020   Intertrigo 12/09/2019   Chronic knee pain (3ry area of Pain) (Bilateral) 11/28/2019   Spinal stenosis of lumbar region with neurogenic claudication 11/04/2019   Current every day smoker 06/27/2019   Edema 06/02/2019   Pain of right hip joint 06/02/2019   Adnexal cyst 09/22/2018   Varicose vein of leg 07/16/2016   Dysphagia 09/30/2015   Benign essential HTN 09/30/2015   Type 2 diabetes mellitus with complication, without long-term current use of insulin  (HCC) 09/30/2015   Angioedema 09/28/2015   Angioneurotic edema 09/28/2015   GERD (gastroesophageal reflux disease) 09/07/2015   Reactive airway disease without complication 07/13/2015   Trochanteric  bursitis of both hips 06/23/2015   Type 2 diabetes mellitus with diabetic polyneuropathy (HCC) 06/23/2015   Restless leg syndrome 02/28/2015   Abdominal wall abscess 02/22/2015   History of MRSA infection 01/27/2015   Morbid obesity with body mass index (BMI) of 40.0 to 44.9 in adult [facility]) 08/23/2014   Allergic rhinitis 11/27/2010   Sleep apnea 07/17/2009   Abdominal pain, epigastric 06/18/2007   PANCREATITIS 05/10/2007   Contact urticaria 05/10/2007   Acute pancreatitis without infection or  necrosis 05/10/2007   Anxiety 04/29/2007   Generalized anxiety disorder with panic attacks 04/29/2007   Essential hypertension 04/29/2007   Benign neoplasm of skin 02/22/2007   Irritable bowel syndrome 02/22/2007   Hypertrophic and atrophic condition of skin 02/22/2007   Disturbance in sleep behavior 02/22/2007    Allergies:  Allergies  Allergen Reactions   Lisinopril Swelling and Anaphylaxis   Niacin Rash   Sulfa Antibiotics Hives   Betadine [Povidone Iodine] Hives   Codeine Phosphate Nausea And Vomiting   Empagliflozin Other (See Comments)    Contraindicated due to hx of groin infection   Iodine Hives   Morphine And Codeine    Medications:  Current Outpatient Medications:    azithromycin (ZITHROMAX) 250 MG tablet, Take 500 mg once, then 250 mg for four days, Disp: 6 tablet, Rfl: 0   benzonatate (TESSALON PERLES) 100 MG capsule, Take 1-2 capsules (100-200 mg total) by mouth 3 (three) times daily as needed., Disp: 30 capsule, Rfl: 0   promethazine-dextromethorphan (PROMETHAZINE-DM) 6.25-15 MG/5ML syrup, Take 5 mLs by mouth 3 (three) times daily as needed for cough., Disp: 118 mL, Rfl: 0   albuterol  (VENTOLIN  HFA) 108 (90 Base) MCG/ACT inhaler, Inhale 2 puffs into the lungs every 6 (six) hours as needed for wheezing or shortness of breath., Disp: 8 g, Rfl: 0   amitriptyline (ELAVIL) 50 MG tablet, Take 50 mg by mouth at bedtime., Disp: , Rfl:    atorvastatin (LIPITOR) 80 MG tablet, Take 80 mg by mouth daily., Disp: , Rfl:    cyclobenzaprine (FLEXERIL) 10 MG tablet, Take 10 mg by mouth 2 (two) times daily as needed., Disp: , Rfl:    DULoxetine (CYMBALTA) 60 MG capsule, Take 60 mg by mouth daily., Disp: , Rfl:    famotidine  (PEPCID ) 20 MG tablet, Take 20 mg by mouth 2 (two) times daily., Disp: , Rfl:    fluticasone  (FLONASE ) 50 MCG/ACT nasal spray, Place 2 sprays into both nostrils daily., Disp: 16 g, Rfl: 0   hydrochlorothiazide (HYDRODIURIL) 25 MG tablet, Take 25 mg by mouth daily.,  Disp: , Rfl:    lidocaine  (XYLOCAINE ) 5 % ointment, 2 (two) times daily as needed., Disp: , Rfl:    losartan (COZAAR) 25 MG tablet, Take 25 mg by mouth daily., Disp: , Rfl:    metFORMIN (GLUCOPHAGE-XR) 500 MG 24 hr tablet, Take 1,000 mg by mouth 2 (two) times daily., Disp: , Rfl:    OZEMPIC, 1 MG/DOSE, 4 MG/3ML SOPN, Inject 1 mg into the skin., Disp: , Rfl:    pregabalin (LYRICA) 225 MG capsule, Take 225 mg by mouth 2 (two) times daily., Disp: , Rfl:   Observations/Objective: Patient is well-developed, well-nourished in no acute distress.  Resting comfortably at home.  Head is normocephalic, atraumatic.  No labored breathing. Speech is clear and coherent with logical content.  Patient is alert and oriented at baseline.  Coarse nonproductive   Assessment and Plan: 1. Acute bacterial bronchitis (Primary) - azithromycin (ZITHROMAX) 250 MG tablet; Take 500 mg  once, then 250 mg for four days  Dispense: 6 tablet; Refill: 0 - benzonatate (TESSALON PERLES) 100 MG capsule; Take 1-2 capsules (100-200 mg total) by mouth 3 (three) times daily as needed.  Dispense: 30 capsule; Refill: 0 - promethazine-dextromethorphan (PROMETHAZINE-DM) 6.25-15 MG/5ML syrup; Take 5 mLs by mouth 3 (three) times daily as needed for cough.  Dispense: 118 mL; Refill: 0  - Take meds as prescribed - Use a cool mist humidifier  -Use saline nose sprays frequently -Force fluids -For any cough or congestion  Use plain Mucinex- regular strength or max strength is fine -For fever or aces or pains- take tylenol  or ibuprofen . -Throat lozenges if help -Follow up if symptoms worsen or do not improve    Follow Up Instructions: I discussed the assessment and treatment plan with the patient. The patient was provided an opportunity to ask questions and all were answered. The patient agreed with the plan and demonstrated an understanding of the instructions.  A copy of instructions were sent to the patient via MyChart unless  otherwise noted below.     The patient was advised to call back or seek an in-person evaluation if the symptoms worsen or if the condition fails to improve as anticipated.    Bari Learn, FNP

## 2024-09-20 ENCOUNTER — Ambulatory Visit
Admission: RE | Admit: 2024-09-20 | Discharge: 2024-09-20 | Disposition: A | Discharge status: Home / Self Care | Source: Ambulatory Visit | Attending: Pain Medicine | Admitting: Pain Medicine

## 2024-09-20 ENCOUNTER — Encounter: Payer: Self-pay | Admitting: Pain Medicine

## 2024-09-20 ENCOUNTER — Ambulatory Visit (HOSPITAL_BASED_OUTPATIENT_CLINIC_OR_DEPARTMENT_OTHER): Admitting: Pain Medicine

## 2024-09-20 VITALS — BP 125/76 | HR 95 | Temp 97.8°F | Resp 16 | Ht 64.0 in | Wt 260.0 lb

## 2024-09-20 DIAGNOSIS — M5020 Other cervical disc displacement, unspecified cervical region: Secondary | ICD-10-CM | POA: Insufficient documentation

## 2024-09-20 DIAGNOSIS — Z883 Allergy status to other anti-infective agents status: Secondary | ICD-10-CM | POA: Insufficient documentation

## 2024-09-20 DIAGNOSIS — M79604 Pain in right leg: Secondary | ICD-10-CM | POA: Insufficient documentation

## 2024-09-20 DIAGNOSIS — M5416 Radiculopathy, lumbar region: Secondary | ICD-10-CM

## 2024-09-20 DIAGNOSIS — M5442 Lumbago with sciatica, left side: Secondary | ICD-10-CM

## 2024-09-20 DIAGNOSIS — M48062 Spinal stenosis, lumbar region with neurogenic claudication: Secondary | ICD-10-CM | POA: Insufficient documentation

## 2024-09-20 DIAGNOSIS — M7138 Other bursal cyst, other site: Secondary | ICD-10-CM | POA: Insufficient documentation

## 2024-09-20 DIAGNOSIS — G8929 Other chronic pain: Secondary | ICD-10-CM

## 2024-09-20 DIAGNOSIS — Z885 Allergy status to narcotic agent status: Secondary | ICD-10-CM | POA: Insufficient documentation

## 2024-09-20 DIAGNOSIS — M5412 Radiculopathy, cervical region: Secondary | ICD-10-CM

## 2024-09-20 DIAGNOSIS — M79605 Pain in left leg: Secondary | ICD-10-CM | POA: Diagnosis present

## 2024-09-20 DIAGNOSIS — R937 Abnormal findings on diagnostic imaging of other parts of musculoskeletal system: Secondary | ICD-10-CM

## 2024-09-20 DIAGNOSIS — Z888 Allergy status to other drugs, medicaments and biological substances status: Secondary | ICD-10-CM | POA: Insufficient documentation

## 2024-09-20 DIAGNOSIS — M5030 Other cervical disc degeneration, unspecified cervical region: Secondary | ICD-10-CM | POA: Diagnosis present

## 2024-09-20 DIAGNOSIS — M48061 Spinal stenosis, lumbar region without neurogenic claudication: Secondary | ICD-10-CM

## 2024-09-20 DIAGNOSIS — Z6841 Body Mass Index (BMI) 40.0 and over, adult: Secondary | ICD-10-CM | POA: Insufficient documentation

## 2024-09-20 DIAGNOSIS — M5441 Lumbago with sciatica, right side: Secondary | ICD-10-CM | POA: Insufficient documentation

## 2024-09-20 MED ORDER — SODIUM CHLORIDE (PF) 0.9 % IJ SOLN
INTRAMUSCULAR | Status: AC
  Filled 2024-09-20: qty 10

## 2024-09-20 MED ORDER — PENTAFLUOROPROP-TETRAFLUOROETH EX AERO
INHALATION_SPRAY | Freq: Once | CUTANEOUS | Status: AC
  Administered 2024-09-20: 30 via TOPICAL

## 2024-09-20 MED ORDER — IOHEXOL 180 MG/ML  SOLN
INTRAMUSCULAR | Status: AC
  Filled 2024-09-20: qty 10

## 2024-09-20 MED ORDER — IOHEXOL 180 MG/ML  SOLN
10.0000 mL | Freq: Once | INTRAMUSCULAR | Status: AC
  Administered 2024-09-20: 10 mL via EPIDURAL

## 2024-09-20 MED ORDER — LIDOCAINE HCL 2 % IJ SOLN
INTRAMUSCULAR | Status: AC
Start: 2024-09-20 — End: 2024-09-20
  Filled 2024-09-20: qty 20

## 2024-09-20 MED ORDER — MIDAZOLAM HCL 5 MG/5ML IJ SOLN
0.5000 mg | Freq: Once | INTRAMUSCULAR | Status: AC
  Administered 2024-09-20: 3 mg via INTRAVENOUS

## 2024-09-20 MED ORDER — ROPIVACAINE HCL 2 MG/ML IJ SOLN
2.0000 mL | Freq: Once | INTRAMUSCULAR | Status: AC
  Administered 2024-09-20: 2 mL via EPIDURAL

## 2024-09-20 MED ORDER — FENTANYL CITRATE (PF) 100 MCG/2ML IJ SOLN: 25.0000 ug | INTRAMUSCULAR | Status: DC | PRN

## 2024-09-20 MED ORDER — MIDAZOLAM HCL 5 MG/5ML IJ SOLN
INTRAMUSCULAR | Status: AC
  Filled 2024-09-20: qty 5

## 2024-09-20 MED ORDER — TRIAMCINOLONE ACETONIDE 40 MG/ML IJ SUSP
40.0000 mg | Freq: Once | INTRAMUSCULAR | Status: AC
  Administered 2024-09-20: 40 mg

## 2024-09-20 MED ORDER — FENTANYL CITRATE (PF) 100 MCG/2ML IJ SOLN
INTRAMUSCULAR | Status: AC
  Filled 2024-09-20: qty 2

## 2024-09-20 MED ORDER — LIDOCAINE HCL 2 % IJ SOLN
20.0000 mL | Freq: Once | INTRAMUSCULAR | Status: AC
  Administered 2024-09-20: 400 mg

## 2024-09-20 MED ORDER — TRIAMCINOLONE ACETONIDE 40 MG/ML IJ SUSP
INTRAMUSCULAR | Status: AC
  Filled 2024-09-20: qty 1

## 2024-09-20 MED ORDER — ROPIVACAINE HCL 2 MG/ML IJ SOLN
INTRAMUSCULAR | Status: AC
  Filled 2024-09-20: qty 20

## 2024-09-20 MED ORDER — SODIUM CHLORIDE 0.9% FLUSH
2.0000 mL | Freq: Once | INTRAVENOUS | Status: AC
  Administered 2024-09-20: 2 mL

## 2024-09-20 NOTE — Patient Instructions (Signed)
 ______________________________________________________________________    Post-Procedure Discharge Instructions  INSTRUCTIONS Apply ice:  Purpose: This will minimize any swelling and discomfort after procedure.  When: Day of procedure, as soon as you get home. How: Fill a plastic sandwich bag with crushed ice. Cover it with a small towel and apply to injection site. How long: (15 min on, 15 min off) Apply for 15 minutes then remove x 15 minutes.  Repeat sequence on day of procedure, until you go to bed. Apply heat:  Purpose: To treat any soreness and discomfort from the procedure. When: Starting the next day after the procedure. How: Apply heat to procedure site starting the day following the procedure. How long: May continue to repeat daily, until discomfort goes away. Food intake: Start with clear liquids (like water) and advance to regular food, as tolerated.  Physical activities: Keep activities to a minimum for the first 8 hours after the procedure. After that, then as tolerated. Driving: If you have received any sedation, be responsible and do not drive. You are not allowed to drive for 24 hours after having sedation. Blood thinner: (Applies only to those taking blood thinners) You may restart your blood thinner 6 hours after your procedure. Insulin : (Applies only to Diabetic patients taking insulin ) As soon as you can eat, you may resume your normal dosing schedule. Infection prevention: Keep procedure site clean and dry. Shower daily and clean area with soap and water.  PAIN DIARY Post-procedure Pain Diary: Extremely important that this be done correctly and accurately. Recorded information will be used to determine the next step in treatment. For the purpose of accuracy, follow these rules: Evaluate only the area treated. Do not report or include pain from an untreated area. For the purpose of this evaluation, ignore all other areas of pain, except for the treated area. After your  procedure, avoid taking a long nap and attempting to complete the pain diary after you wake up. Instead, set your alarm clock to go off every hour, on the hour, for the initial 8 hours after the procedure. Document the duration of the numbing medicine, and the relief you are getting from it. Do not go to sleep and attempt to complete it later. It will not be accurate. If you received sedation, it is likely that you were given a medication that may cause amnesia. Because of this, completing the diary at a later time may cause the information to be inaccurate. This information is needed to plan your care. Follow-up appointment: Keep your post-procedure follow-up evaluation appointment after the procedure (usually 2 weeks for most procedures, 6 weeks for radiofrequencies). DO NOT FORGET to bring you pain diary with you.   EXPECT... (What should I expect to see with my procedure?) From numbing medicine (AKA: Local Anesthetics): Numbness or decrease in pain. You may also experience some weakness, which if present, could last for the duration of the local anesthetic. Onset: Full effect within 15 minutes of injected. Duration: It will depend on the type of local anesthetic used. On the average, 1 to 8 hours.  From steroids (Applies only if steroids were used): Decrease in swelling or inflammation. Once inflammation is improved, relief of the pain will follow. Onset of benefits: Depends on the amount of swelling present. The more swelling, the longer it will take for the benefits to be seen. In some cases, up to 10 days. Duration: Steroids will stay in the system x 2 weeks. Duration of benefits will depend on multiple posibilities including persistent irritating  factors. Side-effects: If present, they may typically last 2 weeks (the duration of the steroids). Frequent: Cramps (if they occur, drink Gatorade and take over-the-counter Magnesium 450-500 mg once to twice a day); water retention with temporary weight  gain; increases in blood sugar; decreased immune system response; increased appetite. Occasional: Facial flushing (red, warm cheeks); mood swings; menstrual changes. Uncommon: Long-term decrease or suppression of natural hormones; bone thinning. (These are more common with higher doses or more frequent use. This is why we prefer that our patients avoid having any injection therapies in other practices.)  Very Rare: Severe mood changes; psychosis; aseptic necrosis. From procedure: Some discomfort is to be expected once the numbing medicine wears off. This should be minimal if ice and heat are applied as instructed.  CALL IF... (When should I call?) You experience numbness and weakness that gets worse with time, as opposed to wearing off. New onset bowel or bladder incontinence. (Applies only to procedures done in the spine)  Emergency Numbers: Durning business hours (Monday - Thursday, 8:00 AM - 4:00 PM) (Friday, 9:00 AM - 12:00 Noon): [phone] After hours: [phone] NOTE: If you are having a problem and are unable connect with, or to talk to a provider, then go to your nearest urgent care or emergency department. If the problem is serious and urgent, please call 911. ______________________________________________________________________     ______________________________________________________________________    Steroid injections  Common steroids for injections Triamcinolone : Used by many sports medicine physicians for large joint and bursal injections, often combined with a local anesthetic like lidocaine . A study focusing on coccydynia (tailbone pain) found triamcinolone  was more effective than betamethasone, suggesting it may also be preferable for other localized inflammation conditions. Methylprednisolone : A common alternative to triamcinolone  that is also a strong anti-inflammatory. It is available in different formulations, with the acetate suspension being the long-acting  option for intra-articular injections. Dexamethasone: This is a non-particulate steroid, meaning it has a lower risk of tissue damage compared to particulate steroids like triamcinolone  and methylprednisolone . While less common for this specific use, it is an option for targeted injections.   Considerations for physicians Particulate vs. non-particulate steroids: Triamcinolone  and methylprednisolone  are particulate, meaning they can clump together. Dexamethasone is non-particulate. Particulate steroids are often preferred for their longer-lasting effects but carry a theoretical higher risk for certain injections (though this is less of a concern in the costochondral joints). Combined injectate: Corticosteroids are typically mixed with a local anesthetic like lidocaine  to provide both immediate pain relief (from the anesthetic) and longer-term inflammation reduction (from the steroid). Imaging guidance: To ensure accurate placement of the needle and medication, physicians may use ultrasound or fluoroscopic guidance for the injection, especially in complex or refractory cases.   Patient guidance Before undergoing a steroid injection, discuss the options with your physician. They will determine the best steroid, dosage, and procedure for your specific case based on factors like: Severity of your condition History of response to other treatments Your overall health status Experience and preference of the physician  Last  Updated: 07/12/2024 ______________________________________________________________________     ______________________________________________________________________    Patient information on: Body mass index (BMI) and Weight Management  Dear Gabrielle Sampson you are receiving this information because your weight may be adversely affecting your health.   Your current Estimated body mass index is 43.27 kg/m as calculated from the following:   Height as of 08/15/24: 5' 5 (1.651 m).    Weight as of 08/15/24: 260 lb (117.9 kg).  We  recommend you talk to your primary care physician about providing or referring you to a supervised weight management program.  Here is some information about weight and the body mass index (BMI) classification:  BMI is a measure of obesity that's calculated by dividing a person's weight in kilograms by their height in meters squared. A person can use an online calculator to determine their BMI. Body mass index (BMI) is a common tool for deciding whether a person has an appropriate body weight.  It measures a person's weight in relation to their height.  According to the [facility] of health (NIH): A BMI of less than 18.5 means that a person is underweight. A BMI of between 18.5 and 24.9 is ideal. A BMI of between 25 and 29.9 is overweight. A BMI over 30 indicates obesity.  Body Mass Index (BMI) Classification BMI level (kg/m2) Category Associated incidence of chronic pain  <18  Underweight   18.5-24.9 Ideal body weight   25-29.9 Overweight  20%  30-34.9 Obese (Class I)  68%  35-39.9 Severe obesity (Class II)  136%  >40 Extreme obesity (Class III)  254%    Morbidly Obese Classification: You will be considered to be Morbidly Obese if your BMI is above 30 and you have one or more of the following conditions caused or associated to obesity: 1.    Type 2 Diabetes (Leading to cardiovascular diseases (CVD), stroke, peripheral vascular diseases (PVD), retinopathy, nephropathy, and neuropathy) 2.    Cardiovascular Disease (High Blood Pressure; Congestive Heart Failure; High Cholesterol; Coronary Artery Disease; Angina; Arrhythmias, Dysrhythmias, or Heart Attacks) 3.    Breathing problems (Asthma; obesity-hypoventilation syndrome; obstructive sleep apnea; chronic inflammatory airway disease; reactive airway disease; or shortness of breath) 4.    Chronic kidney disease 5.    Liver disease (nonalcoholic fatty liver disease) 6.    High blood  pressure 7.    Acid reflux (gastroesophageal reflux disease; heartburn) 8.    Osteoarthritis (OA) (affecting the hip(s), the knee(s) and/or the lower back) (usually requiring knee and/or hip replacements, as well as back surgeries) 9.    Low back pain (Lumbar Facet Syndrome; and/or Degenerative Disc Disease) 10.  Hip pain (Osteoarthritis of hip) (For every 1 lbs of added body weight, there is a 2 lbs increase in pressure inside of each hip articulation. 1:2 mechanical relationship) 11.  Knee pain (Osteoarthritis of knee) (For every 1 lbs of added body weight, there is a 4 lbs increase in pressure inside of each knee articulation. 1:4 mechanical relationship) (patients with a BMI>30 kg/m2 were 6.8 times more likely to develop knee OA than normal-weight individuals) 12.  Cancer: Epidemiological studies have shown that obesity is a risk factor for: post-menopausal breast cancer; cancers of the endometrium, colon and kidney cancer; malignant adenomas of the esophagus. Obese subjects have an approximately 1.5-3.5-fold increased risk of developing these cancers compared with normal-weight subjects, and it has been estimated that between 15 and 45% of these cancers can be attributed to overweight. More recent studies suggest that obesity may also increase the risk of other types of cancer, including pancreatic, hepatic and gallbladder cancer. (Ref: Obesity and cancer. Pischon T, Nthlings U, [employer] H. Proc Nutr Soc. 2008 May;67(2):128-45. doi: 10.1017/S0029665108006976.) The International Agency for Research on Cancer (IARC) has identified 13 cancers associated with overweight and obesity: meningioma, multiple myeloma, adenocarcinoma of the esophagus, and cancers of the thyroid, postmenopausal breast cancer, gallbladder, stomach, liver, pancreas, kidney, ovaries, uterus, colon and rectal (colorectal) cancers. 55 percent of  all cancers diagnosed in women and 24 percent of those diagnosed in men are associated with  overweight and obesity.  Recommendation: If you have any of the above conditions it is urgent that you take a step back and concentrate in losing weight. Dedicate 100% of your efforts on this task. Nothing else will improve your health more than bringing your weight down and your BMI to less than 30.   Nutritionist and/or supervised weight-management program: We are aware that most chronic pain patients are unable to exercise secondary to their pain. For this reason, you must rely on proper nutrition and diet in order to lose the weight. We recommend you talk to a nutritionist.   Bariatric surgery: A person might be considered a candidate for bariatric surgery if they meet one of the following BMI criteria:  BMI of 40 or higher: This is considered extreme obesity (Class III). BMI of 35-39.9: This is considered obesity, and the person might also have a serious weight-related health condition, such as high blood pressure, type 2 diabetes, or severe sleep apnea  BMI of 30-34.9: This might be considered if the person has serious weight-related health problems and hasn't had substantial weight loss or improvement in co-morbidities through other methods   On your own: A realistic goal is to lose 10% of your body weight over a period of 12 months.  If over a period of six (6) months you have unsuccessfully tried to lose weight, then it is time for you to seek professional help and to enter a medically supervised weight management program, and/or undergo bariatric surgery.   Pain management considerations and possible limitations:  1.    Pharmacological Problems: Be advised that the use of opioid analgesics (oxycodone; hydrocodone; morphine; methadone; codeine; and all of their derivatives) have been associated with decreased metabolism and weight gain.  For this reason, should we see that you are unable to lose weight while taking these medications, it may become necessary for us  to taper down and  indefinitely discontinue them.  2.    Technical Problems: The incidence of successful interventional therapies decreases as the patient's BMI increases. It is much more difficult to accomplish a safe and effective interventional therapy on a patient with a BMI above 35. 3.    Radiation Exposure Problems: The x-rays machine, used to accomplish injection therapies, will automatically increase their x-ray output in order to capture an appropriate bone image. This means that radiation exposure increases exponentially with the patient's BMI. (The higher the BMI, the higher the radiation exposure.) Although the level of radiation used at a given time is still safe to the patient, it is not for the physician and/or assisting staff. Unfortunately, radiation exposure is accumulative. Because physicians and the staff have to do procedures and be exposed on a daily basis, this can result in health problems such as cancer and radiation burns. Radiation exposure to the staff is monitored by the radiation batches that they wear. The exposure levels are reported back to the staff on a quarterly basis. Depending on levels of exposure, physicians and staff may be obligated by law to decrease this exposure. This means that they have the right and obligation to refuse providing therapies where they may be overexposed to radiation. For this reason, physicians may decline to offer therapies such as radiofrequency ablation or implants to patients with a BMI above 40. 4.    Current Trends: Be advised that the current trend is to no longer offer certain therapies  to patients with a BMI equal to, or above 35, due to increase perioperative risks, increased technical procedural difficulties, and excessive radiation exposure to healthcare personnel.  Last updated: 08/10/2023 ______________________________________________________________________

## 2024-09-20 NOTE — Progress Notes (Signed)
 Safety precautions to be maintained throughout the outpatient stay will include: orient to surroundings, keep bed in low position, maintain call bell within reach at all times, provide assistance with transfer out of bed and ambulation.

## 2024-09-20 NOTE — Progress Notes (Signed)
 PROVIDER NOTE: Interpretation of information contained herein should be left to medically-trained personnel. Specific patient instructions are provided elsewhere under Patient Instructions section of medical record. This document was created in part using STT-dictation technology, any transcriptional errors that may result from this process are unintentional.  Patient: Gabrielle Sampson Type: Established DOB: [birth year] MRN: [id number] PCP: Eliazar Hart LABOR, MD  Service: Procedure DOS: 09/20/2024 Setting: Ambulatory Location: Ambulatory outpatient facility Delivery: Face-to-face Provider: Eric LABOR Como, MD Specialty: Interventional Pain Management Specialty designation: 09 Location: Outpatient facility Ref. Prov.: Como Eric, MD       Interventional Therapy   Type: Lumbar epidural steroid injection (LESI) (interlaminar) #1    Laterality: Right   Level:  L4-5 Level.  Imaging: Fluoroscopic guidance Spinal ([id number]) Anesthesia: Local anesthesia (1-2% Lidocaine ) Anxiolysis: IV Versed  3.0 mg Sedation: Minimal Sedation None required. No Fentanyl  administered.         DOS: 09/20/2024  Performed by: Eric LABOR Como, MD  Purpose: Diagnostic/Therapeutic Indications: Lumbar radicular pain of intraspinal etiology of more than 4 weeks that has failed to respond to conservative therapy and is severe enough to impact quality of life or function. 1. Chronic low back pain (1ry area of Pain) (Bilateral) w/ sciatica (Bilateral)   2. Chronic lower extremity pain (2ry area of Pain) (Bilateral)   3. Lumbar central spinal stenosis at L4-5 level (Severe)   4. 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 Lumbar lateral recess stenosis (L4-5) (Bilateral) (L>R)   6. Lumbar nerve root impingement (L5) (Left)   7. Spinal stenosis of lumbar region with neurogenic claudication   8. Abnormal MRI, lumbar spine (06/01/2024)   9. Morbid obesity with body mass index (BMI) of 40.0 to  44.9 in adult (HCC)   10. Allergy to iodine   11. Allergy to morphine   12. Allergy to povidone-iodine topical antiseptic    NAS-11 Pain score:   Pre-procedure: 4 /10   Post-procedure: 0-No pain/10   Note: Patient initially scheduled for a cervical epidural steroid injection.  Patient contacted neurosurgery.  Neurosurgery contacted our services and requested initial treatment to be an L4-5 LESI. Patient indicated worse pain on right side (LE). She also asked to have Allergy to iodine removed from her record. She indicates tolerating radiological contrast and iodine prep without any problems.     Position / Prep / Materials:  Position: Prone w/ head of the table raised (slight reverse trendelenburg) to facilitate breathing.  Prep solution: ChloraPrep (2% chlorhexidine gluconate and 70% isopropyl alcohol) Prep Area: Entire Posterior Lumbar Region from lower scapular tip down to mid buttocks area and from flank to flank. Materials:  Tray: Epidural tray Needle(s):  Type: Epidural needle (Tuohy) Gauge (G):  17 Length: Long (20cm) Qty: 1  H&P (Pre-op Assessment):  Gabrielle Sampson is a 53 y.o. (year old), female patient, seen today for interventional treatment. She  has a past surgical history that includes Cesarean section (1996 AND 1999); Gallbladder surgery (1996); Knee surgery (Right, 2012); Tonsillectomy and adenoidectomy; Anal fissure repair; Abscess drainage; Esophagogastroduodenoscopy (egd) with propofol  (N/A, 10/25/2015); Tubal ligation; and Cardiac catheterization. Gabrielle Sampson has a current medication list which includes the following prescription(s): albuterol , amitriptyline, atorvastatin, azithromycin, benzonatate, cyclobenzaprine, duloxetine, famotidine , fluticasone , hydrochlorothiazide, lidocaine , losartan, metformin, ozempic (1 mg/dose), pregabalin, and promethazine-dextromethorphan, and the following Facility-Administered Medications: fentanyl . Her primarily concern today is the Back Pain  (low)  Initial Vital Signs:  Pulse/HCG Rate: 95ECG Heart Rate: 93 (nsr) Temp: 97.8 F (36.6 C) Resp: 18 BP:  138/82 SpO2: 97 %  BMI: Estimated body mass index is 44.63 kg/m as calculated from the following:   Height as of this encounter: 5' 4 (1.626 m).   Weight as of this encounter: 260 lb (117.9 kg).  Risk Assessment: Allergies: Reviewed. She is allergic to lisinopril, niacin, sulfa antibiotics, codeine phosphate, empagliflozin, and morphine and codeine.  Allergy Precautions: None required Coagulopathies: Reviewed. None identified.  Blood-thinner therapy: None at this time Active Infection(s): Reviewed. None identified. Gabrielle Sampson is afebrile  Site Confirmation: Gabrielle Sampson was asked to confirm the procedure and laterality before marking the site Procedure checklist: Completed Consent: Before the procedure and under the influence of no sedative(s), amnesic(s), or anxiolytics, the patient was informed of the treatment options, risks and possible complications. To fulfill our ethical and legal obligations, as recommended by the American Medical Association's Code of Ethics, I have informed the patient of my clinical impression; the nature and purpose of the treatment or procedure; the risks, benefits, and possible complications of the intervention; the alternatives, including doing nothing; the risk(s) and benefit(s) of the alternative treatment(s) or procedure(s); and the risk(s) and benefit(s) of doing nothing. The patient was provided information about the general risks and possible complications associated with the procedure. These may include, but are not limited to: failure to achieve desired goals, infection, bleeding, organ or nerve damage, allergic reactions, paralysis, and death. In addition, the patient was informed of those risks and complications associated to Spine-related procedures, such as failure to decrease pain; infection (i.e.: Meningitis, epidural or intraspinal abscess);  bleeding (i.e.: epidural hematoma, subarachnoid hemorrhage, or any other type of intraspinal or peri-dural bleeding); organ or nerve damage (i.e.: Any type of peripheral nerve, nerve root, or spinal cord injury) with subsequent damage to sensory, motor, and/or autonomic systems, resulting in permanent pain, numbness, and/or weakness of one or several areas of the body; allergic reactions; (i.e.: anaphylactic reaction); and/or death. Furthermore, the patient was informed of those risks and complications associated with the medications. These include, but are not limited to: allergic reactions (i.e.: anaphylactic or anaphylactoid reaction(s)); adrenal axis suppression; blood sugar elevation that in diabetics may result in ketoacidosis or comma; water retention that in patients with history of congestive heart failure may result in shortness of breath, pulmonary edema, and decompensation with resultant heart failure; weight gain; swelling or edema; medication-induced neural toxicity; particulate matter embolism and blood vessel occlusion with resultant organ, and/or nervous system infarction; and/or aseptic necrosis of one or more joints. Finally, the patient was informed that Medicine is not an exact science; therefore, there is also the possibility of unforeseen or unpredictable risks and/or possible complications that may result in a catastrophic outcome. The patient indicated having understood very clearly. We have given the patient no guarantees and we have made no promises. Enough time was given to the patient to ask questions, all of which were answered to the patient's satisfaction. Ms. Kluck has indicated that she wanted to continue with the procedure. Attestation: I, the ordering provider, attest that I have discussed with the patient the benefits, risks, side-effects, alternatives, likelihood of achieving goals, and potential problems during recovery for the procedure that I have provided informed  consent. Date  Time: 09/20/2024  8:26 AM  Pre-Procedure Preparation:  Monitoring: As per clinic protocol. Respiration, ETCO2, SpO2, BP, heart rate and rhythm monitor placed and checked for adequate function Safety Precautions: Patient was assessed for positional comfort and pressure points before starting the procedure. Time-out: I initiated and conducted the  Time-out before starting the procedure, as per protocol. The patient was asked to participate by confirming the accuracy of the Time Out information. Verification of the correct person, site, and procedure were performed and confirmed by me, the nursing staff, and the patient. Time-out conducted as per Joint Commission's Universal Protocol (UP.01.01.01). Time: 0909 Start Time: 0909 hrs.  Description/Narrative of Procedure:          Target: Epidural space via interlaminar opening, initially targeting the lower laminar border of the superior vertebral body. Region: Lumbar Approach: Percutaneous paravertebral  Rationale (medical necessity): procedure needed and proper for the diagnosis and/or treatment of the patient's medical symptoms and needs. Procedural Technique Safety Precautions: Aspiration looking for blood return was conducted prior to all injections. At no point did we inject any substances, as a needle was being advanced. No attempts were made at seeking any paresthesias. Safe injection practices and needle disposal techniques used. Medications properly checked for expiration dates. SDV (single dose vial) medications used. Description of the Procedure: Protocol guidelines were followed. The procedure needle was introduced through the skin, ipsilateral to the reported pain, and advanced to the target area. Bone was contacted and the needle walked caudad, until the lamina was cleared. The epidural space was identified using "loss-of-resistance technique" with 2-3 ml of PF-NaCl (0.9% NSS), in a 5cc LOR glass syringe.  Vitals:    09/20/24 0918 09/20/24 0928 09/20/24 0938 09/20/24 0948  BP: (!) 127/90 123/75 123/75 125/76  Pulse:      Resp: 15 16 16 16   Temp:      SpO2: 100% 98% 96% 97%  Weight:      Height:        Start Time: 0909 hrs. End Time: 0917 hrs.  Imaging Guidance (Spinal):          Type of Imaging Technique: Fluoroscopy Guidance (Spinal) Indication(s): Fluoroscopy guidance for needle placement to enhance accuracy in procedures requiring precise needle localization for targeted delivery of medication in or near specific anatomical locations not easily accessible without such real-time imaging assistance. Exposure Time: Please see nurses notes. Contrast: Before injecting any contrast, we confirmed that the patient did not have an allergy to iodine, shellfish, or radiological contrast. Once satisfactory needle placement was completed at the desired level, radiological contrast was injected. Contrast injected under live fluoroscopy. No contrast complications. See chart for type and volume of contrast used. Fluoroscopic Guidance: I was personally present during the use of fluoroscopy. Tunnel Vision Technique used to obtain the best possible view of the target area. Parallax error corrected before commencing the procedure. Direction-depth-direction technique used to introduce the needle under continuous pulsed fluoroscopy. Once target was reached, antero-posterior, oblique, and lateral fluoroscopic projection used confirm needle placement in all planes. Images permanently stored in EMR. Interpretation: I personally interpreted the imaging intraoperatively. Adequate needle placement confirmed in multiple planes. Appropriate spread of contrast into desired area was observed. No evidence of afferent or efferent intravascular uptake. No intrathecal or subarachnoid spread observed. Permanent images saved into the patient's record.  Antibiotic Prophylaxis:   Anti-infectives (From admission, onward)    None       Indication(s): None identified  Post-operative Assessment:  Post-procedure Vital Signs:  Pulse/HCG Rate: 9582 Temp: 97.8 F (36.6 C) Resp: 16 BP: 125/76 SpO2: 97 %  EBL: None  Complications: No immediate post-treatment complications observed by team, or reported by patient.  Note: The patient tolerated the entire procedure well. A repeat set of vitals were taken after the procedure and  the patient was kept under observation following institutional policy, for this type of procedure. Post-procedural neurological assessment was performed, showing return to baseline, prior to discharge. The patient was provided with post-procedure discharge instructions, including a section on how to identify potential problems. Should any problems arise concerning this procedure, the patient was given instructions to immediately contact us , at any time, without hesitation. In any case, we plan to contact the patient by telephone for a follow-up status report regarding this interventional procedure.  Comments:  No additional relevant information.  Plan of Care (POC)  Orders:  Orders Placed This Encounter  Procedures   Lumbar Epidural Injection    Scheduling Instructions:     Procedure: Interlaminar LESI TBD     Laterality: TBD     Sedation: With Sedation     Date: 09/20/2024    Where will this procedure be performed?:   [facility]   [facility] C-ARM 1-60 MIN NO REPORT    Intraoperative interpretation by procedural physician at [facility].    Standing Status:   Standing    Number of Occurrences:   1    Reason for exam::   Assistance in needle guidance and placement for procedures requiring needle placement in or near specific anatomical locations not easily accessible without such assistance.   Informed Consent Details: Physician/Practitioner Attestation; Transcribe to consent form and obtain patient signature    Note: Always confirm laterality of pain with Ms. Claudene,  before procedure. Transcribe to consent form and obtain patient signature.    Physician/Practitioner attestation of informed consent for procedure/surgical case:   I, the physician/practitioner, attest that I have discussed with the patient the benefits, risks, side effects, alternatives, likelihood of achieving goals and potential problems during recovery for the procedure that I have provided informed consent.    Procedure:   Lumbar epidural steroid injection under fluoroscopic guidance    Physician/Practitioner performing the procedure:   Jayden Kratochvil A. Arieal Cuoco, MD    Indication/Reason:   Low back and/or lower extremity pain secondary to lumbar radiculitis   Provide equipment / supplies at bedside    Procedural tray: Epidural Tray (Disposable  single use) Skin infiltration needle: Regular 1.5-in, 25-G, (x1) Block needle size: Long Catheter: No catheter required    Standing Status:   Standing    Number of Occurrences:   1    Specify:   Epidural Tray   Saline lock IV    Have LR [phone] mL available and administer at 125 mL/hr if patient becomes hypotensive.    Standing Status:   Standing    Number of Occurrences:   1   Miscellanous precautions    NOTE: Although It is true that patients can have allergies to shellfish and that shellfish contain iodine, most shellfish  allergies are due to two protein allergens present in the shellfish: tropomyosins and parvalbumin. Not all patients with shellfish allergies are allergic to iodine. However, as a precaution, avoid using iodine containing products.    Standing Status:   Standing    Number of Occurrences:   1     Opioid Analgesic: None MME/day: 0 mg/day    Medications ordered for procedure: Meds ordered this encounter  Medications   iohexol (OMNIPAQUE) 180 MG/ML injection 10 mL    Must be Myelogram-compatible. If not available, you may substitute with a water-soluble, non-ionic, hypoallergenic, myelogram-compatible radiological contrast  medium.   lidocaine  (XYLOCAINE ) 2 % (with pres) injection 400 mg   pentafluoroprop-tetrafluoroeth (GEBAUERS) aerosol  midazolam  (VERSED ) 5 MG/5ML injection 0.5-2 mg    Make sure Flumazenil is available in the pyxis when using this medication. If oversedation occurs, administer 0.2 mg IV over 15 sec. If after 45 sec no response, administer 0.2 mg again over 1 min; may repeat at 1 min intervals; not to exceed 4 doses (1 mg)   fentaNYL  (SUBLIMAZE ) injection 25-50 mcg    Make sure Narcan is available in the pyxis when using this medication. In the event of respiratory depression (RR< 8/min): Titrate NARCAN (naloxone) in increments of 0.1 to 0.2 mg IV at 2-3 minute intervals, until desired degree of reversal.   sodium chloride  flush (NS) 0.9 % injection 2 mL   ropivacaine (PF) 2 mg/mL (0.2%) (NAROPIN) injection 2 mL   triamcinolone  acetonide (KENALOG -40) injection 40 mg   Medications administered: We administered iohexol, lidocaine , pentafluoroprop-tetrafluoroeth, midazolam , sodium chloride  flush, ropivacaine (PF) 2 mg/mL (0.2%), and triamcinolone  acetonide.  See the medical record for exact dosing, route, and time of administration.    Interventional Therapies  Risk Factors  Considerations  Medical Comorbidities:  ALLERGY: Betadine, Iodine, Codeine, Morphine  MO (BMI>40)  T2IIDDM  HTN  OSA  Tobacco use/Smoker  GERD  GAD w/ Panic Attacks  IBS     Planned  Pending:   Diagnostic/therapeutic midline interlaminar L3-4 vs L4-5 LESI #1    Under consideration:   Diagnostic/therapeutic left cervical ESI #1 (initially scheduled for this on 08/15/2024, but changed by request of neurosurgery) Diagnostic/therapeutic midline interlaminar L3-4 vs L4-5 LESI #1  Diagnostic/therapeutic bilateral lumbar facet MBB #1  Patient must bring BMI down to <30 kg/m   Completed: (Analgesic benefit)1  None at this time   Therapeutic  Palliative (PRN) options:   None established   Completed by other  providers: (See under care everywhere x-rays)  Therapeutic (Undetermined laterality) L4-5 IL LESI x2 (10/31/2021, 02/17/2022) by Curtistine FABIENE Sarna, MD [facility]) (w/ Dexamethasone + Lidocaine )  Therapeutic right L5-S1 TFESI x1 (05/29/2021) by Thais America, MD [facility])  Therapeutic right L4-5 facet joint cyst aspiration/inj. x1 (04/23/2020) by Franky LABOR. Carneiro, DO [facility]R)   1(Analgesic benefit): Expressed in percentage (%). (Local anesthetic[LA] +/- sedation  L.A.Local Anesthetic  Steroid benefit  Ongoing benefit)      Follow-up plan:   Return in about 2 weeks (around 10/04/2024) for (Face2F), (PPE).     Recent Visits Date Type Provider Dept  08/15/24 Office Visit Tanya Glisson, MD [facility]  07/06/24 Office Visit Tanya Glisson, MD [facility]  Showing recent visits within past 90 days and meeting all other requirements Today's Visits Date Type Provider Dept  09/20/24 Procedure visit Tanya Glisson, MD [facility]  Showing today's visits and meeting all other requirements Future Appointments Date Type Provider Dept  10/12/24 Appointment Tanya Glisson, MD [facility]  Showing future appointments within next 90 days and meeting all other requirements   Disposition: Discharge home  Discharge (Date  Time): 09/20/2024; 0953 hrs.   Primary Care Physician: Eliazar Hart LABOR, MD Location: [facility] Note by: Glisson LABOR Tanya, MD (TTS technology used. I apologize for any typographical errors that were not detected and corrected.) Date: 09/20/2024; Time: 9:58 AM  Disclaimer:  Medicine is not an visual merchandiser. The only guarantee in medicine is that nothing is guaranteed. It is important to note that the decision to proceed with this intervention was based on the information collected from the patient. The Data and conclusions  were drawn from the patient's  questionnaire, the interview, and the physical examination. Because the information was provided in large part by the patient, it cannot be guaranteed that it has not been purposely or unconsciously manipulated. Every effort has been made to obtain as much relevant data as possible for this evaluation. It is important to note that the conclusions that lead to this procedure are derived in large part from the available data. Always take into account that the treatment will also be dependent on availability of resources and existing treatment guidelines, considered by other Pain Management Practitioners as being common knowledge and practice, at the time of the intervention. For Medico-Legal purposes, it is also important to point out that variation in procedural techniques and pharmacological choices are the acceptable norm. The indications, contraindications, technique, and results of the above procedure should only be interpreted and judged by a Board-Certified Interventional Pain Specialist with extensive familiarity and expertise in the same exact procedure and technique.

## 2024-09-21 ENCOUNTER — Telehealth: Payer: Self-pay

## 2024-09-21 NOTE — Telephone Encounter (Signed)
 Post procedure follow up.  Patient states she is doing well.   ?

## 2024-09-25 ENCOUNTER — Telehealth

## 2024-10-12 ENCOUNTER — Ambulatory Visit (HOSPITAL_BASED_OUTPATIENT_CLINIC_OR_DEPARTMENT_OTHER): Admitting: Pain Medicine

## 2024-10-12 DIAGNOSIS — G8929 Other chronic pain: Secondary | ICD-10-CM

## 2024-10-12 DIAGNOSIS — Z09 Encounter for follow-up examination after completed treatment for conditions other than malignant neoplasm: Secondary | ICD-10-CM

## 2024-10-12 DIAGNOSIS — Z91199 Patient's noncompliance with other medical treatment and regimen due to unspecified reason: Secondary | ICD-10-CM

## 2024-10-12 NOTE — Progress Notes (Signed)
 Department: [facility]) Date: 10/12/2024  Event: NO SHOW.  Encounter Type: (PPE-1) First/Initial post-procedure evaluation. Visit to review the results of the diagnostic portion of the interventional therapy. Advance notice: None.  Reason: Unknown.          Significance: Unintended loss of valuable diagnostic information. Accuracy of recall has been shown to be inversely proportional to amount of time since procedure. Note: See patient information (AVS) for Appointment policy.

## 2024-10-17 NOTE — Progress Notes (Deleted)
 Referring Physician:  Eliazar Hart LABOR, MD [address] [place],  [place] [address]  Primary Physician:  Eliazar Hart LABOR, MD  History of Present Illness: Gabrielle Sampson has a history of HTN, reactive airway disease, IBS, pancreatitis, GERD, DM with neuropathy, depression, hyperlipidemia, obesity, RLS.   She last had video visit with me on 06/02/24 to review her lumbar MRI. 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 Also with mild right foraminal stenosis L5-S1.   She was sent to PT ([facility]) and referred to Dr. Naveira for possible injections along with [facility]. We discussed possible lumbar MRI with and without contrast to further evaluate synovial cyst and she declined.   She had initial eval for PT on 06/15/24. She saw Dr. Tanya on 07/06/24 and has f/u with him on 08/15/24.   She is here for follow up.   She is about the same. She continues with constant LBP. She now has bilateral intermittent leg pain from her calf to toes. Pain is worse with standing and walking. She has intermittent numbness and tingling in both legs. Pain is better with sitting or laying flat. She also has bilateral intermittent knee pain.   She also notes new numbness/tingling and weakness in her hands. She has been dropping things. She is having new dexterity issues. She has intermittent neck pain with no arm pain. She notes balance issues.   Her last HgbA1c on 02/22/24 was 7.   No relief with previous lumbar injections in 2022 and 2023.   She smokes 1/2 ppd x 30 years.   Bowel/Bladder Dysfunction: none  Conservative measures:  Physical therapy: [facility] initial eval 06/15/24- last visit was August 08, 2024 due to death in the family Multimodal medical therapy including regular antiinflammatories: Flexeril, Oxycodone, Lidocaine  patches, Tylenol , Cymbalta, Lyrica  Injections:   L4-L5 IL [facility] 02/17/22 [facility] L4-L5 IL [facility] 10/31/21 [facility]  Past Surgery: none  Gabrielle Sampson has no symptoms of cervical myelopathy.  The symptoms are causing a significant impact on the patient's life.   Review of Systems:  A 10 point review of systems is negative, except for the pertinent positives and negatives detailed in the HPI.  Past Medical History: Past Medical History:  Diagnosis Date   Anxiety    Diabetes (HCC)    Fatigue    HBP (high blood pressure)    Morbid obesity (HCC)    Neuropathy    Osteoarthritis    Pancreatitis    Reflux     Past Surgical History: Past Surgical History:  Procedure Laterality Date   ABSCESS DRAINAGE     MRSA from abdominal wound   ANAL FISSURE REPAIR     CARDIAC CATHETERIZATION     CESAREAN SECTION  1996 AND 1999   ESOPHAGOGASTRODUODENOSCOPY (EGD) WITH PROPOFOL  N/A 10/25/2015   Procedure: ESOPHAGOGASTRODUODENOSCOPY (EGD) WITH PROPOFOL ;  Surgeon: Lamar ONEIDA Holmes, MD;  [facility] ENDOSCOPY;  Service: Endoscopy;  Laterality: N/A;   GALLBLADDER SURGERY  1996   KNEE SURGERY Right 2012   TONSILLECTOMY AND ADENOIDECTOMY     TUBAL LIGATION      Allergies: Allergies as of 10/24/2024 - Review Complete 09/20/2024  Allergen Reaction Noted   Lisinopril Swelling and Anaphylaxis 09/28/2015   Niacin Rash 01/16/2009   Sulfa antibiotics Hives 06/14/2015   Codeine phosphate Nausea And Vomiting 01/19/2007   Empagliflozin Other (See Comments) 02/15/2021   Morphine and codeine  08/18/2013  Medications: Outpatient Encounter Medications as of 10/24/2024  Medication Sig   albuterol  (VENTOLIN  HFA) 108 (90 Base) MCG/ACT inhaler Inhale 2 puffs into the lungs every 6 (six) hours as needed for wheezing or shortness of breath.   amitriptyline (ELAVIL) 50 MG tablet Take 50 mg by mouth at bedtime.   atorvastatin (LIPITOR) 80 MG tablet Take 80 mg by mouth daily.   azithromycin  (ZITHROMAX ) 250 MG tablet Take 500 mg once, then 250 mg for four days    benzonatate  (TESSALON  PERLES) 100 MG capsule Take 1-2 capsules (100-200 mg total) by mouth 3 (three) times daily as needed.   cyclobenzaprine (FLEXERIL) 10 MG tablet Take 10 mg by mouth 2 (two) times daily as needed.   DULoxetine (CYMBALTA) 60 MG capsule Take 60 mg by mouth daily.   famotidine  (PEPCID ) 20 MG tablet Take 20 mg by mouth 2 (two) times daily.   fluticasone  (FLONASE ) 50 MCG/ACT nasal spray Place 2 sprays into both nostrils daily.   hydrochlorothiazide (HYDRODIURIL) 25 MG tablet Take 25 mg by mouth daily.   lidocaine  (XYLOCAINE ) 5 % ointment 2 (two) times daily as needed.   losartan (COZAAR) 25 MG tablet Take 25 mg by mouth daily.   metFORMIN (GLUCOPHAGE-XR) 500 MG 24 hr tablet Take 1,000 mg by mouth 2 (two) times daily.   OZEMPIC, 1 MG/DOSE, 4 MG/3ML SOPN Inject 1 mg into the skin.   pregabalin (LYRICA) 225 MG capsule Take 225 mg by mouth 2 (two) times daily.   promethazine -dextromethorphan (PROMETHAZINE -DM) 6.25-15 MG/5ML syrup Take 5 mLs by mouth 3 (three) times daily as needed for cough.   No facility-administered encounter medications on file as of 10/24/2024.    Social History: Social History   Tobacco Use   Smoking status: Every Day    Current packs/day: 0.00    Average packs/day: 0.5 packs/day for 20.0 years (10.0 ttl pk-yrs)    Types: Cigarettes    Start date: 08/08/1993    Last attempt to quit: 08/08/2013    Years since quitting: 11.2   Smokeless tobacco: Never  Substance Use Topics   Alcohol use: No   Drug use: No    Family Medical History: Family History  Problem Relation Age of Onset   Hypothyroidism Mother    Melanoma Mother    CAD Father     Physical Examination: There were no vitals filed for this visit.     Awake, alert, oriented to person, place, and time.  Speech is clear and fluent. Fund of knowledge is appropriate.   Cranial Nerves: Pupils equal round and reactive to light.  Facial tone is symmetric.    No abnormal lesions on exposed  skin.   Strength: Side Biceps Triceps Deltoid Interossei Grip Wrist Ext. Wrist Flex.  R 5 5 5 5 5 5 5   L 5 5 5 5 5 5 5    Side Iliopsoas Quads Hamstring PF DF EHL  R 5 5 5 5 5 5   L 5 5 5 5 5 5    Reflexes are 2+ and symmetric at the biceps, brachioradialis, patella and achilles.   Hoffman's is absent.  Clonus is not present.   Bilateral upper and lower extremity sensation is intact to light touch.     No pain with IR/ER of both hips.   Gait is normal.    Medical Decision Making  Imaging: Lumbar xrays dated 07/11/24:  FINDINGS: Soft tissue attenuation from habitus limits detailed assessment. There are 5 non-rib-bearing lumbar vertebra. 8 mm anterolisthesis of L4 on  L5. Limited range of motion on flexion and extension, but no evidence of instability. Anterior spurring L1-L2, L3-L4, L4-L5, and L5-S1. L3-L4 and L4-L5 facet hypertrophy. Hemangiomas on prior MRI are not well demonstrated by radiograph. Vascular calcifications in the left upper quadrant   IMPRESSION: 1. Grade 1 anterolisthesis of L4 on L5. No evidence of instability. 2. Multilevel facet hypertrophy.     Electronically Signed   By: Andrea Gasman M.D.   On: 07/18/2024 23:11  I have personally reviewed the images and agree with the above interpretation.   Assessment and Plan: Ms. Trawick continues with constant LBP. She now has bilateral intermittent leg pain from her calf to toes. Pain is worse with standing and walking. She has intermittent numbness and tingling in both legs.  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 Also with mild right foraminal stenosis L5-S1.   She also notes new numbness/tingling and weakness in her hands. She has been dropping things. She is having new dexterity issues. She has intermittent neck pain with no arm pain. She notes balance issues.   No cervical imaging.      Treatment options discussed with patient and following plan made:    - MRI of cervical spine to further evaluate neck pain, dexterity issues.  - Follow up with Dr. Naveira as scheduled to discuss possible injections.  - Continue with PT for lumbar spine- has done 4 weeks.  - Continue to work on weight loss. Goal prior to surgery is 210lbs. She is doing well.  - She would need to quit smoking prior to any surgery.  - Of note, last HgbA1c on 02/22/24 was 7.  - Will do MyChart visit to review her cervical MRI once I have results. Depending on these results, may consider EMG.   I spent a total of 35 minutes in face-to-face and non-face-to-face activities related to this patient's care today including review of outside records, review of imaging, review of symptoms, physical exam, discussion of differential diagnosis, discussion of treatment options, and documentation.   Glade Boys PA-C Dept. of Neurosurgery

## 2024-10-18 ENCOUNTER — Ambulatory Visit: Admitting: Orthopedic Surgery

## 2024-10-24 ENCOUNTER — Ambulatory Visit: Admitting: Orthopedic Surgery

## 2024-10-27 DIAGNOSIS — R2241 Localized swelling, mass and lump, right lower limb: Secondary | ICD-10-CM | POA: Insufficient documentation

## 2024-10-30 NOTE — Progress Notes (Unsigned)
 PROVIDER NOTE: Interpretation of information contained herein should be left to medically-trained personnel. Specific patient instructions are provided elsewhere under Patient Instructions section of medical record. This document was created in part using AI and STT-dictation technology, any transcriptional errors that may result from this process are unintentional.  Patient: Gabrielle Sampson  Service: E/M   PCP: Eliazar Hart LABOR, MD  DOB: [birth year]  DOS: 10/31/2024  Provider: Eric LABOR Como, MD  MRN: [id number]  Delivery: Face-to-face  Specialty: Interventional Pain Management  Type: Established Patient  Setting: Ambulatory outpatient facility  Specialty designation: 09  Referring Prov.: Eliazar Hart LABOR, MD  Location: Outpatient office facility       History of present illness (HPI) [name], a 53 y.o. year old female, is here today because of her Chronic bilateral low back pain with bilateral sciatica [M54.42, M54.41, G89.29]. [name] primary complain today is No chief complaint on file.  Pertinent problems: [name] has Abdominal pain, epigastric; Arthritis; Pain of left calf; Chronic knee pain (3ry area of Pain) (Bilateral); History of acute pancreatitis; History of MRSA infection; Lipoma of left upper extremity; Lower abdominal pain; Neck pain on left side; Neuropathy due to type 2 diabetes mellitus (HCC); Pain in pelvis; Chronic feet pain (4th area of Pain) (Bilateral); Painful skin lesion; Restless leg syndrome; Spinal stenosis of lumbar region with neurogenic claudication; Trochanteric bursitis of both hips; Arm pain, posterior, right; Type 2 diabetes mellitus with diabetic polyneuropathy (HCC); Pain of right hip joint; Acute pancreatitis without infection or necrosis; Chronic pain syndrome; Chronic painful diabetic neuropathy (HCC); Abnormal MRI, lumbar spine (06/01/2024); Lumbar lateral recess stenosis (L4-5) (Bilateral) (L>R); Lumbar facet arthropathy;  Lumbar nerve root impingement (L5) (Left); Lumbar central spinal stenosis at L4-5 level (Severe); Central spinal stenosis (Severe) (L4-5); 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); Chronic low back pain (1ry area of Pain) (Bilateral) w/ sciatica (Bilateral); Chronic lower extremity pain (2ry area of Pain) (Bilateral); Low back pain of over 3 months duration; Low back pain potentially associated with radiculopathy; Low back pain radiating to legs (Bilateral); Multifactorial low back pain; Numbness and tingling of feet (Bilateral); Chronic hip pain (Bilateral); History of arthroscopy of knee (Right); Varicose veins with pain; Lumbar facet hypertrophy (Multilevel) (Bilateral); Lumbar facet joint pain; Lumbar facet joint syndrome; Tricompartment osteoarthritis of knees (Bilateral); Osteoarthritis of knees (Bilateral); Osteoarthritis of facet joint of lumbar spine; Osteoarthritis of lumbar spine; Abnormal MRI, cervical spine (08/10/2024); Cervical disc herniation; Cervical radiculopathy (Left); DDD (degenerative disc disease), cervical; and Abnormal brain MRI on their pertinent problem list.  Pain Assessment: Severity of   is reported as a  /10. Location:    / . Onset:  . Quality:  . Timing:  . Modifying factor(s):  SABRA Vitals:  vitals were not taken for this visit.  BMI: Estimated body mass index is 44.63 kg/m as calculated from the following:   Height as of 09/20/24: 5' 4 (1.626 m).   Weight as of 09/20/24: 260 lb (117.9 kg).  Last encounter: 09/20/2024. Last procedure: 09/20/2024.  Reason for encounter: post-procedure evaluation and assessment.   Discussed the use of AI scribe software for clinical note transcription with the patient, who gave verbal consent to proceed.  History of Present Illness          Post-Procedure Evaluation   Type: Lumbar epidural steroid injection (LESI) (interlaminar) #1    Laterality: Right   Level:  L4-5 Level.  Imaging: Fluoroscopic guidance Spinal  ([id number]) Anesthesia: Local anesthesia (1-2% Lidocaine )  Anxiolysis: IV Versed  3.0 mg Sedation: Minimal Sedation None required. No Fentanyl  administered.         DOS: 09/20/2024  Performed by: Eric DELENA Como, MD  Purpose: Diagnostic/Therapeutic Indications: Lumbar radicular pain of intraspinal etiology of more than 4 weeks that has failed to respond to conservative therapy and is severe enough to impact quality of life or function. 1. Chronic low back pain (1ry area of Pain) (Bilateral) w/ sciatica (Bilateral)   2. Chronic lower extremity pain (2ry area of Pain) (Bilateral)   3. Lumbar central spinal stenosis at L4-5 level (Severe)   4. 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 Lumbar lateral recess stenosis (L4-5) (Bilateral) (L>R)   6. Lumbar nerve root impingement (L5) (Left)   7. Spinal stenosis of lumbar region with neurogenic claudication   8. Abnormal MRI, lumbar spine (06/01/2024)   9. Morbid obesity with body mass index (BMI) of 40.0 to 44.9 in adult (HCC)   10. Allergy to iodine   11. Allergy to morphine   12. Allergy to povidone-iodine topical antiseptic    NAS-11 Pain score:   Pre-procedure: 4 /10   Post-procedure: 0-No pain/10   Note: Patient initially scheduled for a cervical epidural steroid injection.  Patient contacted neurosurgery.  Neurosurgery contacted our services and requested initial treatment to be an L4-5 LESI. Patient indicated worse pain on right side (LE). She also asked to have Allergy to iodine removed from her record. She indicates tolerating radiological contrast and iodine prep without any problems.    Effectiveness:  Initial hour after procedure:   ***. Subsequent 4-6 hours post-procedure:   ***. Analgesia past initial 6 hours:   ***. Ongoing improvement:  Analgesic:  *** Function:    ***    ROM:    ***    Interpretation: ***  Pharmacotherapy Assessment   Opioid Analgesic: None MME/day: 0 mg/day   Monitoring: [place] PMP:  PDMP reviewed during this encounter.       Pharmacotherapy: No side-effects or adverse reactions reported. Compliance: No problems identified. Effectiveness: Clinically acceptable.  No notes on file  UDS:  Summary  Date Value Ref Range Status  07/06/2024 FINAL  Final    Comment:    ==================================================================== Compliance Drug Analysis, Ur ==================================================================== Test                             Result       Flag       Units  Drug Present and Declared for Prescription Verification   Pregabalin                     PRESENT      EXPECTED   Cyclobenzaprine                PRESENT      EXPECTED   Desmethylcyclobenzaprine       PRESENT      EXPECTED    Desmethylcyclobenzaprine is an expected metabolite of    cyclobenzaprine.    Amitriptyline                  PRESENT      EXPECTED   Nortriptyline                  PRESENT      EXPECTED    Nortriptyline is an expected metabolite of amitriptyline.    Duloxetine  PRESENT      EXPECTED  Drug Present not Declared for Prescription Verification   Carboxy-THC                    139          UNEXPECTED ng/mg creat    Carboxy-THC is a metabolite of tetrahydrocannabinol (THC). Source of    THC is most commonly herbal marijuana or marijuana-based products,    but THC is also present in a scheduled prescription medication.    Trace amounts of THC can be present in hemp and cannabidiol (CBD)    products. This test is not intended to distinguish between delta-9-    tetrahydrocannabinol, the predominant form of THC in most herbal or    marijuana-based products, and delta-8-tetrahydrocannabinol.  Drug Absent but Declared for Prescription Verification   Citalopram                      Not Detected UNEXPECTED   Lidocaine                       Not Detected UNEXPECTED    Lidocaine , as indicated in the declared medication list, is not    always detected  even when used as directed.  ==================================================================== Test                      Result    Flag   Units      Ref Range   Creatinine              41               mg/dL      >=79 ==================================================================== Declared Medications:  The flagging and interpretation on this report are based on the  following declared medications.  Unexpected results may arise from  inaccuracies in the declared medications.   **Note: The testing scope of this panel includes these medications:   Amitriptyline  Cyclobenzaprine (Flexeril)  Duloxetine (Cymbalta)  Escitalopram (Lexapro)  Pregabalin (Lyrica)   **Note: The testing scope of this panel does not include small to  moderate amounts of these reported medications:   Lidocaine  (Xylocaine )   **Note: The testing scope of this panel does not include the  following reported medications:   Atorvastatin (Lipitor)  Famotidine  (Pepcid )  Hydrochlorothiazide (Hydrodiuril)  Losartan (Cozaar)  Metformin  Semaglutide (Ozempic) ==================================================================== For clinical consultation, please call [phone]. ====================================================================     No results found for: CBDTHCR No results found for: D8THCCBX No results found for: D9THCCBX  ROS  Constitutional: Denies any fever or chills Gastrointestinal: No reported hemesis, hematochezia, vomiting, or acute GI distress Musculoskeletal: Denies any acute onset joint swelling, redness, loss of ROM, or weakness Neurological: No reported episodes of acute onset apraxia, aphasia, dysarthria, agnosia, amnesia, paralysis, loss of coordination, or loss of consciousness  Medication Review  DULoxetine, Semaglutide (1 MG/DOSE), albuterol , amitriptyline, atorvastatin, azithromycin , benzonatate , cyclobenzaprine, famotidine , fluticasone ,  hydrochlorothiazide, lidocaine , losartan, metFORMIN, pregabalin, and promethazine -dextromethorphan  History Review  Allergy: Ms. Siragusa is allergic to lisinopril, niacin, sulfa antibiotics, codeine phosphate, empagliflozin, and morphine and codeine. Drug: Ms. Grimmett  reports no history of drug use. Alcohol:  reports no history of alcohol use. Tobacco:  reports that she has been smoking cigarettes. She started smoking about 31 years ago. She has a 10 pack-year smoking history. She has never used smokeless tobacco. Social: Ms. Kallio  reports that she has been smoking cigarettes. She started  smoking about 31 years ago. She has a 10 pack-year smoking history. She has never used smokeless tobacco. She reports that she does not drink alcohol and does not use drugs. Medical:  has a past medical history of Anxiety, Diabetes (HCC), Fatigue, HBP (high blood pressure), Morbid obesity (HCC), Neuropathy, Osteoarthritis, Pancreatitis, and Reflux. Surgical: Ms. Fazzino  has a past surgical history that includes Cesarean section (1996 AND 1999); Gallbladder surgery (1996); Knee surgery (Right, 2012); Tonsillectomy and adenoidectomy; Anal fissure repair; Abscess drainage; Esophagogastroduodenoscopy (egd) with propofol  (N/A, 10/25/2015); Tubal ligation; and Cardiac catheterization. Family: family history includes CAD in her father; Hypothyroidism in her mother; Melanoma in her mother.  Laboratory Chemistry Profile   Renal Lab Results  Component Value Date   BUN 13 07/06/2024   CREATININE 0.54 (L) 07/06/2024   BCR 24 (H) 07/06/2024   GFRAA >60 12/22/2016   GFRNONAA >60 12/22/2016    Hepatic Lab Results  Component Value Date   AST 17 07/06/2024   ALT 24 12/22/2016   ALBUMIN 4.3 07/06/2024   ALKPHOS 153 (H) 07/06/2024   AMYLASE 47 03/28/2011   LIPASE 43 12/22/2016    Electrolytes Lab Results  Component Value Date   NA 140 07/06/2024   K 4.5 07/06/2024   CL 99 07/06/2024   CALCIUM 10.0 07/06/2024   MG  2.2 07/06/2024    Bone Lab Results  Component Value Date   25OHVITD1 26 (L) 07/06/2024   25OHVITD2 <1.0 07/06/2024   25OHVITD3 26 07/06/2024    Inflammation (CRP: Acute Phase) (ESR: Chronic Phase) Lab Results  Component Value Date   CRP 6 07/06/2024   ESRSEDRATE 38 07/06/2024         Note: Above Lab results reviewed.  Recent Imaging Review  DG PAIN CLINIC C-ARM 1-60 MIN NO REPORT Fluoro was used, but no Radiologist interpretation will be provided.  Please refer to NOTES tab for provider progress note. Note: Reviewed        Physical Exam  Vitals: There were no vitals taken for this visit. BMI: Estimated body mass index is 44.63 kg/m as calculated from the following:   Height as of 09/20/24: 5' 4 (1.626 m).   Weight as of 09/20/24: 260 lb (117.9 kg). Ideal: Patient weight not recorded General appearance: Well nourished, well developed, and well hydrated. In no apparent acute distress Mental status: Alert, oriented x 3 (person, place, & time)       Respiratory: No evidence of acute respiratory distress Eyes: PERLA   Assessment   Diagnosis Status  1. Chronic low back pain (1ry area of Pain) (Bilateral) w/ sciatica (Bilateral)   2. Chronic lower extremity pain (2ry area of Pain) (Bilateral)   3. 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)   4. Postop check    Controlled Controlled Controlled   Updated Problems: No problems updated.  Plan of Care  Problem-specific:  Assessment and Plan            Ms. Ival Basquez has a current medication list which includes the following long-term medication(s): albuterol , amitriptyline, atorvastatin, duloxetine, fluticasone , hydrochlorothiazide, losartan, metformin, and pregabalin.  Pharmacotherapy (Medications Ordered): No orders of the defined types were placed in this encounter.  Orders:  No orders of the defined types were placed in this encounter.    Interventional Therapies  Risk Factors   Considerations  Medical Comorbidities:  ALLERGY: Betadine, Iodine, Codeine, Morphine  MO (BMI>40)  T2IIDDM  HTN  OSA  Tobacco use/Smoker  GERD  GAD w/ Panic  Attacks  IBS     Planned  Pending:   Diagnostic/therapeutic midline interlaminar L3-4 vs L4-5 LESI #1    Under consideration:   Diagnostic/therapeutic left cervical ESI #1 (initially scheduled for this on 08/15/2024, but changed by request of neurosurgery) Diagnostic/therapeutic midline interlaminar L3-4 vs L4-5 LESI #1  Diagnostic/therapeutic bilateral lumbar facet MBB #1  Patient must bring BMI down to <30 kg/m   Completed: (Analgesic benefit)1  None at this time   Therapeutic  Palliative (PRN) options:   None established   Completed by other providers: (See under care everywhere x-rays)  Therapeutic (Undetermined laterality) L4-5 IL LESI x2 (10/31/2021, 02/17/2022) by Curtistine FABIENE Sarna, MD [facility]) (w/ Dexamethasone + Lidocaine )  Therapeutic right L5-S1 TFESI x1 (05/29/2021) by Thais America, MD [facility])  Therapeutic right L4-5 facet joint cyst aspiration/inj. x1 (04/23/2020) by Franky LABOR. Carneiro, DO [facility] PMR)   1(Analgesic benefit): Expressed in percentage (%). (Local anesthetic[LA] +/- sedation  L.A.Local Anesthetic  Steroid benefit  Ongoing benefit)     No follow-ups on file.    Recent Visits Date Type Provider Dept  09/20/24 Procedure visit Tanya Glisson, MD [facility]  08/15/24 Office Visit Tanya Glisson, MD [facility]  Showing recent visits within past 90 days and meeting all other requirements Future Appointments Date Type Provider Dept  10/31/24 Appointment Tanya Glisson, MD [facility]  Showing future appointments within next 90 days and meeting all other requirements  I discussed the assessment and treatment plan with the patient. The patient was provided an opportunity to ask questions and all were answered. The  patient agreed with the plan and demonstrated an understanding of the instructions.  Patient advised to call back or seek an in-person evaluation if the symptoms or condition worsens.  Duration of encounter: *** minutes.  Total time on encounter, as per AMA guidelines included both the face-to-face and non-face-to-face time personally spent by the physician and/or other qualified health care professional(s) on the day of the encounter (includes time in activities that require the physician or other qualified health care professional and does not include time in activities normally performed by clinical staff). Physician's time may include the following activities when performed: Preparing to see the patient (e.g., pre-charting review of records, searching for previously ordered imaging, lab work, and nerve conduction tests) Review of prior analgesic pharmacotherapies. Reviewing PMP Interpreting ordered tests (e.g., lab work, imaging, nerve conduction tests) Performing post-procedure evaluations, including interpretation of diagnostic procedures Obtaining and/or reviewing separately obtained history Performing a medically appropriate examination and/or evaluation Counseling and educating the patient/family/caregiver Ordering medications, tests, or procedures Referring and communicating with other health care professionals (when not separately reported) Documenting clinical information in the electronic or other health record Independently interpreting results (not separately reported) and communicating results to the patient/ family/caregiver Care coordination (not separately reported)  Note by: Glisson LABOR Tanya, MD (TTS and AI technology used. I apologize for any typographical errors that were not detected and corrected.) Date: 10/31/2024; Time: 12:32 PM

## 2024-10-30 NOTE — Progress Notes (Unsigned)
 11/01/2024 Gabrielle Sampson [id number] Feb 16, 1971  Gastroenterology Office Note    Referring Provider: Eliazar Hart LABOR, MD Primary Care Physician:  Eliazar Hart LABOR, MD  Primary GI Provider: Jinny Carmine, MD    Chief Complaint   Chief Complaint  Patient presents with   New Patient (Initial Visit)    Dysphagia- x 6 months-feels like food gets stuck-also has cough-reflux x last 2 days also constipation-takes colace sometimes-BM every 3 days-tried miralax  and stool softeners- not regularly     History of Present Illness   Gabrielle Sampson is a 53 y.o. female presenting today at the request of Alkhaldi, Rana A, MD due to dysphagia and chronic cough.  Patient seen by PCP on 10/27/2024 with continued complaints of sensation of a lump in her throat, persistent productive cough.  Seen virtually on 09/26/2024 with globus sensation and coughing for weeks.  Today, patient reports that for the last 6 months she feels like something is stuck in her throat.  She states that her food all feels dry and she has to drink sips of fluids to get it to go down.  Even when she is not eating, she has a sensation that there is something in her throat and has to clear her throat often.  She reports she had bronchitis about 2 months ago and was treated for that and the cough did seem to improve but now it is still lingering.  She has been on famotidine  for couple of years, she took omeprazole prior to that.  She states that for the last few days she has been having more breakthrough acid reflux and occasionally she may have some nocturnal symptoms. Denies nausea or vomiting.   Patient reports she has been constipated since starting on Ozempic about a year ago.  She is currently taking 2 mg of Ozempic once weekly. She has a bowel movement once every 3 days.  She often has to strain when having a bowel movement.  She only drinks about 2 bottles of water a day, drinks half sweet half unsweet  tea, 1 Sprite a day.  Rare  NSAID, denies alcohol use. Denies melena or hematochezia  10/25/2015 EGD - Normal esophagus.  - Erythematous mucosa in the antrum. Biopsied. ANTRAL MUCOSA WITH REACTIVE FOVEOLAR HYPERPLASIA AND EVIDENCE OF HEALING EROSIVE GASTRITIS. NEGATIVE FOR H. PYLORI, DYSPLASIA, AND MALIGNANCY.  - Normal examined duodenum.  Negative cologuard 05/06/2024  Past Medical History:  Diagnosis Date   Anxiety    Diabetes (HCC)    Fatigue    HBP (high blood pressure)    Morbid obesity (HCC)    Neuropathy    Osteoarthritis    Pancreatitis    Reflux     Past Surgical History:  Procedure Laterality Date   ABSCESS DRAINAGE     MRSA from abdominal wound   ANAL FISSURE REPAIR     CARDIAC CATHETERIZATION     CESAREAN SECTION  1996 AND 1999   ESOPHAGOGASTRODUODENOSCOPY (EGD) WITH PROPOFOL  N/A 10/25/2015   Procedure: ESOPHAGOGASTRODUODENOSCOPY (EGD) WITH PROPOFOL ;  Surgeon: Lamar ONEIDA Holmes, MD;  Location: [facility] ENDOSCOPY;  Service: Endoscopy;  Laterality: N/A;   GALLBLADDER SURGERY  1996   KNEE SURGERY Right 2012   TONSILLECTOMY AND ADENOIDECTOMY     TUBAL LIGATION      Current Outpatient Medications  Medication Sig Dispense Refill   albuterol  (VENTOLIN  HFA) 108 (90 Base) MCG/ACT inhaler Inhale 2 puffs into the lungs every 6 (six) hours as needed for wheezing or shortness of  breath. 8 g 0   amitriptyline (ELAVIL) 50 MG tablet Take 50 mg by mouth at bedtime.     atorvastatin (LIPITOR) 80 MG tablet Take 80 mg by mouth daily.     cyclobenzaprine (FLEXERIL) 10 MG tablet Take 10 mg by mouth 2 (two) times daily as needed.     DULoxetine (CYMBALTA) 60 MG capsule Take 60 mg by mouth daily.     famotidine  (PEPCID ) 20 MG tablet Take 1 tablet (20 mg total) by mouth at bedtime. 30 tablet 2   fluticasone  (FLONASE ) 50 MCG/ACT nasal spray Place 2 sprays into both nostrils daily. 16 g 0   hydrochlorothiazide (HYDRODIURIL) 25 MG tablet Take 25 mg by mouth daily.     lidocaine  (XYLOCAINE ) 5  % ointment 2 (two) times daily as needed.     losartan (COZAAR) 25 MG tablet Take 25 mg by mouth daily.     metFORMIN (GLUCOPHAGE-XR) 500 MG 24 hr tablet Take 1,000 mg by mouth 2 (two) times daily.     OZEMPIC, 1 MG/DOSE, 4 MG/3ML SOPN Inject 1 mg into the skin.     pantoprazole  (PROTONIX ) 40 MG tablet Take 1 tablet (40 mg total) by mouth daily. 90 tablet 1   pregabalin (LYRICA) 225 MG capsule Take 225 mg by mouth 2 (two) times daily.     promethazine -dextromethorphan (PROMETHAZINE -DM) 6.25-15 MG/5ML syrup Take 5 mLs by mouth 3 (three) times daily as needed for cough. 118 mL 0   No current facility-administered medications for this visit.    Allergies as of 11/01/2024 - Review Complete 11/01/2024  Allergen Reaction Noted   Lisinopril Swelling and Anaphylaxis 09/28/2015   Niacin Rash 01/16/2009   Sulfa antibiotics Hives 06/14/2015   Codeine phosphate Nausea And Vomiting 01/19/2007   Empagliflozin Other (See Comments) 02/15/2021   Morphine and codeine  08/18/2013    Family History  Problem Relation Age of Onset   Hypothyroidism Mother    Melanoma Mother    CAD Father     Social History   Socioeconomic History   Marital status: Divorced    Spouse name: Not on file   Number of children: Not on file   Years of education: Not on file   Highest education level: Not on file  Occupational History   Not on file  Tobacco Use   Smoking status: Every Day    Current packs/day: 0.00    Average packs/day: 0.5 packs/day for 20.0 years (10.0 ttl pk-yrs)    Types: Cigarettes    Start date: 08/08/1993    Last attempt to quit: 08/08/2013    Years since quitting: 11.2   Smokeless tobacco: Never  Substance and Sexual Activity   Alcohol use: No   Drug use: No   Sexual activity: Not on file  Other Topics Concern   Not on file  Social History Narrative   Lives at home with her husband, independent at baseline.   Social Drivers of Health   Tobacco Use: High Risk (11/01/2024)   Patient  History    Smoking Tobacco Use: Every Day    Smokeless Tobacco Use: Never    Passive Exposure: Not on file  Financial Resource Strain: Medium Risk (09/19/2024)   Received from [facility]   Overall Financial Resource Strain (CARDIA)    Difficulty of Paying Living Expenses: Somewhat hard  Food Insecurity: Food Insecurity Present (09/19/2024)   Received from [facility]   Epic    Within the past 12 months, you worried  that your food would run out before you got the money to buy more.: Never true    Within the past 12 months, the food you bought just didn't last and you didn't have money to get more.: Sometimes true  Transportation Needs: No Transportation Needs (09/19/2024)   Received from [facility] - Transportation    In the past 12 months, has lack of transportation kept you from medical appointments or from getting medications?: No    Lack of Transportation (Non-Medical): No  Physical Activity: Not on file  Stress: Not on file  Social Connections: Not on file  Intimate Partner Violence: Not on file  Depression [id number]): Low Risk (10/31/2024)   Depression (PHQ2-9)    PHQ-2 Score: 0  Alcohol Screen: Not on file  Housing: Low Risk  (09/19/2024)   Received from [facility]   Epic    In the last 12 months, was there a time when you were not able to pay the mortgage or rent on time?: No    In the past 12 months, how many times have you moved where you were living?: 0    At any time in the past 12 months, were you homeless or living in a shelter (including now)?: No  Utilities: Not At Risk (09/19/2024)   Received from [facility]   Epic    In the past 12 months has the electric, gas, oil, or water company threatened to shut off services in your home?: No  Health Literacy: Not on file     RELEVANT GI HISTORY, IMAGING AND LABS: CBC    Component Value Date/Time   WBC 13.3 (H) 12/22/2016  2249   RBC 3.98 12/22/2016 2249   HGB 11.5 (L) 12/22/2016 2249   HGB 8.8 (L) 02/08/2015 0355   HCT 33.8 (L) 12/22/2016 2249   HCT 28.2 (L) 02/08/2015 0355   PLT 249 12/22/2016 2249   PLT 244 02/08/2015 0355   MCV 84.9 12/22/2016 2249   MCV 85 02/08/2015 0355   MCH 29.0 12/22/2016 2249   MCHC 34.2 12/22/2016 2249   RDW 14.7 (H) 12/22/2016 2249   RDW 15.3 (H) 02/08/2015 0355   LYMPHSABS 2.2 02/08/2015 0355   MONOABS 0.5 02/08/2015 0355   EOSABS 0.2 02/08/2015 0355   BASOSABS 0.1 02/08/2015 0355   No results for input(s): HGB in the last 8760 hours.  CMP     Component Value Date/Time   NA 140 07/06/2024 1006   NA 137 02/08/2015 0355   K 4.5 07/06/2024 1006   K 4.0 02/08/2015 0355   CL 99 07/06/2024 1006   CL 103 02/08/2015 0355   CO2 28 12/22/2016 2249   CO2 29 02/08/2015 0355   GLUCOSE 88 07/06/2024 1006   GLUCOSE 278 (H) 12/22/2016 2249   GLUCOSE 107 (H) 02/08/2015 0355   BUN 13 07/06/2024 1006   BUN 19 02/08/2015 0355   CREATININE 0.54 (L) 07/06/2024 1006   CREATININE 0.76 02/08/2015 0355   CALCIUM 10.0 07/06/2024 1006   CALCIUM 8.7 (L) 02/08/2015 0355   PROT 7.3 07/06/2024 1006   PROT 8.6 (H) 09/01/2014 2200   ALBUMIN 4.3 07/06/2024 1006   ALBUMIN 3.2 (L) 09/01/2014 2200   AST 17 07/06/2024 1006   AST 36 09/01/2014 2200   ALT 24 12/22/2016 2249   ALT 30 09/01/2014 2200   ALKPHOS 153 (H) 07/06/2024 1006   ALKPHOS 151 (H) 09/01/2014 2200   BILITOT 0.6 07/06/2024 1006  BILITOT 0.5 09/01/2014 2200   GFRNONAA >60 12/22/2016 2249   GFRNONAA >60 02/08/2015 0355   GFRAA >60 12/22/2016 2249   GFRAA >60 02/08/2015 0355      Latest Ref Rng & Units 07/06/2024   10:06 AM 12/22/2016   10:49 PM 09/28/2015    9:37 AM  Hepatic Function  Total Protein 6.0 - 8.5 g/dL 7.3  8.4  7.3   Albumin 3.8 - 4.9 g/dL 4.3  3.6  3.1   AST 0 - 40 IU/L 17  46  32   ALT 14 - 54 U/L  24  12   Alk Phosphatase 44 - 121 IU/L 153  127  92   Total Bilirubin 0.0 - 1.2 mg/dL 0.6  0.5  0.7        Review of Systems   All systems reviewed and negative except where noted in HPI.    Physical Exam  BP 127/81   Pulse 94   Temp 98.4 F (36.9 C)   Ht 5' 5 (1.651 m)   Wt 269 lb 12.8 oz (122.4 kg)   SpO2 99%   BMI 44.90 kg/m  No LMP recorded. Patient has had an ablation. General:   Alert and oriented. Pleasant and cooperative. Well-nourished and well-developed. In no acute distress.  Head:  Normocephalic and atraumatic. Eyes:  Without icterus Ears:  Normal auditory acuity. Neck:  Supple; no masses or thyromegaly. Lungs:  Respirations even and unlabored.  Clear throughout to auscultation.   No wheezes, crackles, or rhonchi. No acute distress. Heart:  Regular rate and rhythm; no murmurs, clicks, rubs, or gallops. Abdomen:  Normal bowel sounds.  No bruits.  Soft, non-tender and non-distended without masses, hepatosplenomegaly or hernias noted.  No guarding or rebound tenderness.   Rectal:  Deferred. Msk:  Symmetrical without gross deformities. Normal posture. Extremities:  Without edema. Neurologic:  Alert and  oriented x4;  grossly normal neurologically. Skin:  Intact without significant lesions or rashes. Psych:  Alert and cooperative. Normal mood and affect.   Assessment & Plan   Gabrielle Sampson is a 53 y.o. female presenting today with dysphagia, chronic cough, GERD, and constipation.  Dysphagia with globus sensation, associated chronic cough.  - Schedule EGD with dilatation to evaluate for stenosis, tumor, erosive/infectious esophagititis, and EOE. I discussed risks of EGD with patient today, including risk of sedation, bleeding or perforation.  Patient provides understanding and gave verbal consent to proceed. - consider barium swallow - In the interim patient advised about swallowing precautions.  - Eat slowly, chew food well before swallowing.  - Drink liquids in between each bite to avoid food impaction. - ER precautions discussed with the patient    GERD - discussed lifestyle modifications, avoid trigger foods, do not eat 3 hours prior to bed - start pantoprazole  40 mg once daily 30 min prior to meals - continue famotidine  20 mg at night - proceed with EGD.  Constipation - discussed constipation in setting of GLP-1 - Recommend High Fiber diet with fruits, vegetables, and whole grains. - Drink 64 ounces of Fluids Daily. - Start Miralax  Mix 1 capful in a drink daily  - Start Benefiber Mix 1 TBSP in a drink daily. - If no improvement, consider Linzess, Amitiza or Trulance.  Follow up in 2 month  Grayce Bohr, DNP, AGNP-C [facility]

## 2024-10-31 ENCOUNTER — Ambulatory Visit: Admitting: Pain Medicine

## 2024-10-31 ENCOUNTER — Encounter: Payer: Self-pay | Admitting: Pain Medicine

## 2024-10-31 VITALS — BP 129/74 | HR 97 | Temp 97.0°F | Resp 16 | Ht 64.0 in | Wt 260.0 lb

## 2024-10-31 DIAGNOSIS — M25561 Pain in right knee: Secondary | ICD-10-CM | POA: Diagnosis not present

## 2024-10-31 DIAGNOSIS — M170 Bilateral primary osteoarthritis of knee: Secondary | ICD-10-CM | POA: Diagnosis present

## 2024-10-31 DIAGNOSIS — M7138 Other bursal cyst, other site: Secondary | ICD-10-CM | POA: Insufficient documentation

## 2024-10-31 DIAGNOSIS — Z09 Encounter for follow-up examination after completed treatment for conditions other than malignant neoplasm: Secondary | ICD-10-CM

## 2024-10-31 DIAGNOSIS — M79605 Pain in left leg: Secondary | ICD-10-CM | POA: Diagnosis present

## 2024-10-31 DIAGNOSIS — G8929 Other chronic pain: Secondary | ICD-10-CM | POA: Diagnosis not present

## 2024-10-31 DIAGNOSIS — M5442 Lumbago with sciatica, left side: Secondary | ICD-10-CM | POA: Diagnosis not present

## 2024-10-31 DIAGNOSIS — M25551 Pain in right hip: Secondary | ICD-10-CM | POA: Diagnosis not present

## 2024-10-31 DIAGNOSIS — M5441 Lumbago with sciatica, right side: Secondary | ICD-10-CM | POA: Diagnosis present

## 2024-10-31 DIAGNOSIS — M79604 Pain in right leg: Secondary | ICD-10-CM | POA: Diagnosis not present

## 2024-10-31 NOTE — Progress Notes (Signed)
 Safety precautions to be maintained throughout the outpatient stay will include: orient to surroundings, keep bed in low position, maintain call bell within reach at all times, provide assistance with transfer out of bed and ambulation.

## 2024-10-31 NOTE — Patient Instructions (Signed)
 ______________________________________________________________________    Procedure instructions  Stop blood-thinners  Do not eat or drink fluids (other than water) for 6 hours before your procedure  No water for 2 hours before your procedure  Take your blood pressure medicine with a sip of water  Arrive 30 minutes before your appointment  If sedation is planned, bring suitable driver. Gabrielle Sampson, [place], & public transportation are NOT APPROVED)  Carefully read the Preparing for your procedure detailed instructions  If you have questions call us  at [phone]  Procedure appointments are for procedures only.   NO medication refills or new problem evaluations will be done on procedure days.   Only the scheduled, pre-approved procedure and side will be done.   ______________________________________________________________________     ______________________________________________________________________    Preparing for your procedure  Appointments: If you think you may not be able to keep your appointment, call 24-48 hours in advance to cancel. We need time to make it available to others.  Procedure visits are for procedures only. During your procedure appointment there will be: NO Prescription Refills*. NO medication changes or discussions*. NO discussion of disability issues*. NO unrelated pain problem evaluations*. NO evaluations to order other pain procedures*. *These will be addressed at a separate and distinct evaluation encounter on the provider's evaluation schedule and not during procedure days.  Instructions: Food intake: Avoid eating anything solid for at least 8 hours prior to your procedure. Clear liquid intake: You may take clear liquids such as water up to 2 hours prior to your procedure. (No carbonated drinks. No soda.) Transportation: Unless otherwise stated by your physician, bring a driver. (Driver cannot be a Market Researcher, Pharmacist, Community, or any other form of public  transportation.) Morning Medicines: Except for blood thinners, take all of your other morning medications with a sip of water. Make sure to take your heart and blood pressure medicines. If your blood pressure's lower number is above 100, the case will be rescheduled. Blood thinners: Make sure to stop your blood thinners as instructed.  If you take a blood thinner, but were not instructed to stop it, call our office [phone] and ask to talk to a nurse. Not stopping a blood thinner prior to certain procedures could lead to serious complications. Diabetics on insulin : Notify the staff so that you can be scheduled 1st case in the morning. If your diabetes requires high dose insulin , take only  of your normal insulin  dose the morning of the procedure and notify the staff that you have done so. Preventing infections: Shower with an antibacterial soap the morning of your procedure.  Build-up your immune system: Take 1000 mg of Vitamin C with every meal (3 times a day) the day prior to your procedure. Antibiotics: Inform the nursing staff if you are taking any antibiotics or if you have any conditions that may require antibiotics prior to procedures. (Example: recent joint implants)   Pregnancy: If you are pregnant make sure to notify the nursing staff. Not doing so may result in injury to the fetus, including death.  Sickness: If you have a cold, fever, or any active infections, call and cancel or reschedule your procedure. Receiving steroids while having an infection may result in complications. Arrival: You must be in the facility at least 30 minutes prior to your scheduled procedure. Tardiness: Your scheduled time is also the cutoff time. If you do not arrive at least 15 minutes prior to your procedure, you will be rescheduled.  Children: Do not bring any children with  you. Make arrangements to keep them home. Dress appropriately: There is always a possibility that your clothing may get soiled. Avoid  long dresses. Valuables: Do not bring any jewelry or valuables.  Reasons to call and reschedule or cancel your procedure: (Following these recommendations will minimize the risk of a serious complication.) Surgeries: Avoid having procedures within 2 weeks of any surgery. (Avoid for 2 weeks before or after any surgery). Flu Shots: Avoid having procedures within 2 weeks of a flu shots or . (Avoid for 2 weeks before or after immunizations). Barium: Avoid having a procedure within 7-10 days after having had a radiological study involving the use of radiological contrast. (Myelograms, Barium swallow or enema study). Heart attacks: Avoid any elective procedures or surgeries for the initial 6 months after a Myocardial Infarction (Heart Attack). Blood thinners: It is imperative that you stop these medications before procedures. Let us  know if you if you take any blood thinner.  Infection: Avoid procedures during or within two weeks of an infection (including chest colds or gastrointestinal problems). Symptoms associated with infections include: Localized redness, fever, chills, night sweats or profuse sweating, burning sensation when voiding, cough, congestion, stuffiness, runny nose, sore throat, diarrhea, nausea, vomiting, cold or Flu symptoms, recent or current infections. It is specially important if the infection is over the area that we intend to treat. Heart and lung problems: Symptoms that may suggest an active cardiopulmonary problem include: cough, chest pain, breathing difficulties or shortness of breath, dizziness, ankle swelling, uncontrolled high or unusually low blood pressure, and/or palpitations. If you are experiencing any of these symptoms, cancel your procedure and contact your primary care physician for an evaluation.  Remember:  Regular Business hours are:  Monday to Thursday 8:00 AM to 4:00 PM  Provider's Schedule: Eric Como, MD:  Procedure days: Tuesday and Thursday 7:30  AM to 4:00 PM  Wallie Sherry, MD:  Procedure days: Monday and Wednesday 7:30 AM to 4:00 PM Last  Updated: 10/27/2023 ______________________________________________________________________     ______________________________________________________________________    General Risks and Possible Complications  Patient Responsibilities: It is important that you read this as it is part of your informed consent. It is our duty to inform you of the risks and possible complications associated with treatments offered to you. It is your responsibility as a patient to read this and to ask questions about anything that is not clear or that you believe was not covered in this document.  Patients Rights: You have the right to refuse treatment. You also have the right to change your mind, even after initially having agreed to have the treatment done. However, under this last option, if you wait until the last second to change your mind, you may be charged for the materials used up to that point.  Introduction: Medicine is not an visual merchandiser. Everything in Medicine, including the lack of treatment(s), carries the potential for danger, harm, or loss (which is by definition: Risk). In Medicine, a complication is a secondary problem, condition, or disease that can aggravate an already existing one. All treatments carry the risk of possible complications. The fact that a side effects or complications occurs, does not imply that the treatment was conducted incorrectly. It must be clearly understood that these can happen even when everything is done following the highest safety standards.  No treatment: You can choose not to proceed with the proposed treatment alternative. The PRO(s) would include: avoiding the risk of complications associated with the therapy. The CON(s) would include:  not getting any of the treatment benefits. These benefits fall under one of three categories: diagnostic; therapeutic; and/or  palliative. Diagnostic benefits include: getting information which can ultimately lead to improvement of the disease or symptom(s). Therapeutic benefits are those associated with the successful treatment of the disease. Finally, palliative benefits are those related to the decrease of the primary symptoms, without necessarily curing the condition (example: decreasing the pain from a flare-up of a chronic condition, such as incurable terminal cancer).  General Risks and Complications: These are associated to most interventional treatments. They can occur alone, or in combination. They fall under one of the following six (6) categories: no benefit or worsening of symptoms; bleeding; infection; nerve damage; allergic reactions; and/or death. No benefits or worsening of symptoms: In Medicine there are no guarantees, only probabilities. No healthcare provider can ever guarantee that a medical treatment will work, they can only state the probability that it may. Furthermore, there is always the possibility that the condition may worsen, either directly, or indirectly, as a consequence of the treatment. Bleeding: This is more common if the patient is taking a blood thinner, either prescription or over the counter (example: Goody Powders, Fish oil, Aspirin , Garlic, etc.), or if suffering a condition associated with impaired coagulation (example: Hemophilia, cirrhosis of the liver, low platelet counts, etc.). However, even if you do not have one on these, it can still happen. If you have any of these conditions, or take one of these drugs, make sure to notify your treating physician. Infection: This is more common in patients with a compromised immune system, either due to disease (example: diabetes, cancer, human immunodeficiency virus [HIV], etc.), or due to medications or treatments (example: therapies used to treat cancer and rheumatological diseases). However, even if you do not have one on these, it can still  happen. If you have any of these conditions, or take one of these drugs, make sure to notify your treating physician. Nerve Damage: This is more common when the treatment is an invasive one, but it can also happen with the use of medications, such as those used in the treatment of cancer. The damage can occur to small secondary nerves, or to large primary ones, such as those in the spinal cord and brain. This damage may be temporary or permanent and it may lead to impairments that can range from temporary numbness to permanent paralysis and/or brain death. Allergic Reactions: Any time a substance or material comes in contact with our body, there is the possibility of an allergic reaction. These can range from a mild skin rash (contact dermatitis) to a severe systemic reaction (anaphylactic reaction), which can result in death. Death: In general, any medical intervention can result in death, most of the time due to an unforeseen complication. ______________________________________________________________________      ______________________________________________________________________    Steroid injections  Common steroids for injections Triamcinolone : Used by many sports medicine physicians for large joint and bursal injections, often combined with a local anesthetic like lidocaine . A study focusing on coccydynia (tailbone pain) found triamcinolone  was more effective than betamethasone, suggesting it may also be preferable for other localized inflammation conditions. Methylprednisolone : A common alternative to triamcinolone  that is also a strong anti-inflammatory. It is available in different formulations, with the acetate suspension being the long-acting option for intra-articular injections. Dexamethasone: This is a non-particulate steroid, meaning it has a lower risk of tissue damage compared to particulate steroids like triamcinolone  and methylprednisolone . While less common for this specific  use,  it is an option for targeted injections.   Considerations for physicians Particulate vs. non-particulate steroids: Triamcinolone  and methylprednisolone  are particulate, meaning they can clump together. Dexamethasone is non-particulate. Particulate steroids are often preferred for their longer-lasting effects but carry a theoretical higher risk for certain injections (though this is less of a concern in the costochondral joints). Combined injectate: Corticosteroids are typically mixed with a local anesthetic like lidocaine  to provide both immediate pain relief (from the anesthetic) and longer-term inflammation reduction (from the steroid). Imaging guidance: To ensure accurate placement of the needle and medication, physicians may use ultrasound or fluoroscopic guidance for the injection, especially in complex or refractory cases.   Patient guidance Before undergoing a steroid injection, discuss the options with your physician. They will determine the best steroid, dosage, and procedure for your specific case based on factors like: Severity of your condition History of response to other treatments Your overall health status Experience and preference of the physician  Last  Updated: 07/12/2024 ______________________________________________________________________     ______________________________________________________________________    Patient information on: Body mass index (BMI) and Weight Management  Dear Ms. Coonradt you are receiving this information because your weight may be adversely affecting your health.   Your current Estimated body mass index is 44.63 kg/m as calculated from the following:   Height as of this encounter: 5' 4 (1.626 m).   Weight as of this encounter: 260 lb (117.9 kg).  We recommend you talk to your primary care physician about providing or referring you to a supervised weight management program.  Here is some information about weight and the body mass  index (BMI) classification:  BMI is a measure of obesity that's calculated by dividing a person's weight in kilograms by their height in meters squared. A person can use an online calculator to determine their BMI. Body mass index (BMI) is a common tool for deciding whether a person has an appropriate body weight.  It measures a person's weight in relation to their height.  According to the [facility] of health (NIH): A BMI of less than 18.5 means that a person is underweight. A BMI of between 18.5 and 24.9 is ideal. A BMI of between 25 and 29.9 is overweight. A BMI over 30 indicates obesity.  Body Mass Index (BMI) Classification BMI level (kg/m2) Category Associated incidence of chronic pain  <18  Underweight   18.5-24.9 Ideal body weight   25-29.9 Overweight  20%  30-34.9 Obese (Class I)  68%  35-39.9 Severe obesity (Class II)  136%  >40 Extreme obesity (Class III)  254%    Morbidly Obese Classification: You will be considered to be Morbidly Obese if your BMI is above 30 and you have one or more of the following conditions caused or associated to obesity: 1.    Type 2 Diabetes (Leading to cardiovascular diseases (CVD), stroke, peripheral vascular diseases (PVD), retinopathy, nephropathy, and neuropathy) 2.    Cardiovascular Disease (High Blood Pressure; Congestive Heart Failure; High Cholesterol; Coronary Artery Disease; Angina; Arrhythmias, Dysrhythmias, or Heart Attacks) 3.    Breathing problems (Asthma; obesity-hypoventilation syndrome; obstructive sleep apnea; chronic inflammatory airway disease; reactive airway disease; or shortness of breath) 4.    Chronic kidney disease 5.    Liver disease (nonalcoholic fatty liver disease) 6.    High blood pressure 7.    Acid reflux (gastroesophageal reflux disease; heartburn) 8.    Osteoarthritis (OA) (affecting the hip(s), the knee(s) and/or the lower back) (usually requiring knee and/or hip replacements, as  well as back  surgeries) 9.    Low back pain (Lumbar Facet Syndrome; and/or Degenerative Disc Disease) 10.  Hip pain (Osteoarthritis of hip) (For every 1 lbs of added body weight, there is a 2 lbs increase in pressure inside of each hip articulation. 1:2 mechanical relationship) 11.  Knee pain (Osteoarthritis of knee) (For every 1 lbs of added body weight, there is a 4 lbs increase in pressure inside of each knee articulation. 1:4 mechanical relationship) (patients with a BMI>30 kg/m2 were 6.8 times more likely to develop knee OA than normal-weight individuals) 12.  Cancer: Epidemiological studies have shown that obesity is a risk factor for: post-menopausal breast cancer; cancers of the endometrium, colon and kidney cancer; malignant adenomas of the esophagus. Obese subjects have an approximately 1.5-3.5-fold increased risk of developing these cancers compared with normal-weight subjects, and it has been estimated that between 15 and 45% of these cancers can be attributed to overweight. More recent studies suggest that obesity may also increase the risk of other types of cancer, including pancreatic, hepatic and gallbladder cancer. (Ref: Obesity and cancer. Pischon T, Nthlings U, [employer] H. Proc Nutr Soc. 2008 May;67(2):128-45. doi: 10.1017/S0029665108006976.) The International Agency for Research on Cancer (IARC) has identified 13 cancers associated with overweight and obesity: meningioma, multiple myeloma, adenocarcinoma of the esophagus, and cancers of the thyroid, postmenopausal breast cancer, gallbladder, stomach, liver, pancreas, kidney, ovaries, uterus, colon and rectal (colorectal) cancers. 55 percent of all cancers diagnosed in women and 24 percent of those diagnosed in men are associated with overweight and obesity.  Recommendation: If you have any of the above conditions it is urgent that you take a step back and concentrate in losing weight. Dedicate 100% of your efforts on this task. Nothing else will improve  your health more than bringing your weight down and your BMI to less than 30.   Nutritionist and/or supervised weight-management program: We are aware that most chronic pain patients are unable to exercise secondary to their pain. For this reason, you must rely on proper nutrition and diet in order to lose the weight. We recommend you talk to a nutritionist.   Bariatric surgery: A person might be considered a candidate for bariatric surgery if they meet one of the following BMI criteria:  BMI of 40 or higher: This is considered extreme obesity (Class III). BMI of 35-39.9: This is considered obesity, and the person might also have a serious weight-related health condition, such as high blood pressure, type 2 diabetes, or severe sleep apnea  BMI of 30-34.9: This might be considered if the person has serious weight-related health problems and hasn't had substantial weight loss or improvement in co-morbidities through other methods   On your own: A realistic goal is to lose 10% of your body weight over a period of 12 months.  If over a period of six (6) months you have unsuccessfully tried to lose weight, then it is time for you to seek professional help and to enter a medically supervised weight management program, and/or undergo bariatric surgery.   Pain management considerations and possible limitations:  1.    Pharmacological Problems: Be advised that the use of opioid analgesics (oxycodone; hydrocodone; morphine; methadone; codeine; and all of their derivatives) have been associated with decreased metabolism and weight gain.  For this reason, should we see that you are unable to lose weight while taking these medications, it may become necessary for us  to taper down and indefinitely discontinue them.  2.  Technical Problems: The incidence of successful interventional therapies decreases as the patient's BMI increases. It is much more difficult to accomplish a safe and effective interventional therapy  on a patient with a BMI above 35. 3.    Radiation Exposure Problems: The x-rays machine, used to accomplish injection therapies, will automatically increase their x-ray output in order to capture an appropriate bone image. This means that radiation exposure increases exponentially with the patient's BMI. (The higher the BMI, the higher the radiation exposure.) Although the level of radiation used at a given time is still safe to the patient, it is not for the physician and/or assisting staff. Unfortunately, radiation exposure is accumulative. Because physicians and the staff have to do procedures and be exposed on a daily basis, this can result in health problems such as cancer and radiation burns. Radiation exposure to the staff is monitored by the radiation batches that they wear. The exposure levels are reported back to the staff on a quarterly basis. Depending on levels of exposure, physicians and staff may be obligated by law to decrease this exposure. This means that they have the right and obligation to refuse providing therapies where they may be overexposed to radiation. For this reason, physicians may decline to offer therapies such as radiofrequency ablation or implants to patients with a BMI above 40. 4.    Current Trends: Be advised that the current trend is to no longer offer certain therapies to patients with a BMI equal to, or above 35, due to increase perioperative risks, increased technical procedural difficulties, and excessive radiation exposure to healthcare personnel.  Last updated: 08/10/2023 ______________________________________________________________________

## 2024-11-01 ENCOUNTER — Ambulatory Visit: Admitting: Family Medicine

## 2024-11-01 ENCOUNTER — Encounter: Payer: Self-pay | Admitting: Family Medicine

## 2024-11-01 VITALS — BP 127/81 | HR 94 | Temp 98.4°F | Ht 65.0 in | Wt 269.8 lb

## 2024-11-01 DIAGNOSIS — R053 Chronic cough: Secondary | ICD-10-CM

## 2024-11-01 DIAGNOSIS — R1312 Dysphagia, oropharyngeal phase: Secondary | ICD-10-CM

## 2024-11-01 DIAGNOSIS — R1310 Dysphagia, unspecified: Secondary | ICD-10-CM | POA: Diagnosis not present

## 2024-11-01 DIAGNOSIS — R09A2 Foreign body sensation, throat: Secondary | ICD-10-CM | POA: Diagnosis not present

## 2024-11-01 DIAGNOSIS — K5900 Constipation, unspecified: Secondary | ICD-10-CM

## 2024-11-01 DIAGNOSIS — K5909 Other constipation: Secondary | ICD-10-CM

## 2024-11-01 DIAGNOSIS — K219 Gastro-esophageal reflux disease without esophagitis: Secondary | ICD-10-CM

## 2024-11-01 MED ORDER — PANTOPRAZOLE SODIUM 40 MG PO TBEC: 40.0000 mg | DELAYED_RELEASE_TABLET | Freq: Every day | ORAL | 1 refills | Status: AC

## 2024-11-01 MED ORDER — FAMOTIDINE 20 MG PO TABS: 20.0000 mg | ORAL_TABLET | Freq: Every day | ORAL | 2 refills | Status: AC

## 2024-11-01 NOTE — Patient Instructions (Signed)
° ° ° ° ° ° °  Start OTC Benefiber Powder. Mix 1 - 2 Tablespoons in 6 - 8 ounces of a Drink Once Daily. Drink 64 ounces of water / fluids Daily.   For constipation: Start OTC Miralax  Powder Mix 1/2 - 1 capful in 6 to 8 ounces of a drink once daily  Recommend high-fiber diet, 30 g of fiber daily Eat fruits, vegetables, and whole grains Drink 64 ounces of water / fluids daily.

## 2024-11-07 ENCOUNTER — Ambulatory Visit (INDEPENDENT_AMBULATORY_CARE_PROVIDER_SITE_OTHER): Admitting: Vascular Surgery

## 2024-12-06 ENCOUNTER — Telehealth: Payer: Self-pay | Admitting: Pain Medicine

## 2024-12-06 NOTE — Telephone Encounter (Signed)
 PT has question about her meds before her procedure tomorrow.

## 2024-12-06 NOTE — Telephone Encounter (Signed)
 Patient called. Questions answered.

## 2024-12-08 ENCOUNTER — Telehealth: Payer: Self-pay

## 2024-12-08 NOTE — Telephone Encounter (Signed)
 Called [facility] medical records-spoke with Meade and she stated there are no colonoscopy or Egd reports . LVM for patient to return call to office to see if we can get another location as to where she may have had these procedures.

## 2024-12-15 ENCOUNTER — Ambulatory Visit
Admission: RE | Admit: 2024-12-15 | Discharge: 2024-12-15 | Disposition: A | Discharge status: Home / Self Care | Source: Ambulatory Visit | Attending: Pain Medicine | Admitting: Pain Medicine

## 2024-12-15 ENCOUNTER — Ambulatory Visit (HOSPITAL_BASED_OUTPATIENT_CLINIC_OR_DEPARTMENT_OTHER): Admitting: Pain Medicine

## 2024-12-15 ENCOUNTER — Encounter: Payer: Self-pay | Admitting: Pain Medicine

## 2024-12-15 VITALS — BP 143/83 | HR 96 | Temp 98.2°F | Resp 18 | Ht 65.0 in | Wt 265.0 lb

## 2024-12-15 DIAGNOSIS — M545 Low back pain, unspecified: Secondary | ICD-10-CM | POA: Insufficient documentation

## 2024-12-15 DIAGNOSIS — M5441 Lumbago with sciatica, right side: Secondary | ICD-10-CM | POA: Diagnosis not present

## 2024-12-15 DIAGNOSIS — G8929 Other chronic pain: Secondary | ICD-10-CM | POA: Insufficient documentation

## 2024-12-15 DIAGNOSIS — M48062 Spinal stenosis, lumbar region with neurogenic claudication: Secondary | ICD-10-CM

## 2024-12-15 DIAGNOSIS — M5416 Radiculopathy, lumbar region: Secondary | ICD-10-CM

## 2024-12-15 DIAGNOSIS — M48061 Spinal stenosis, lumbar region without neurogenic claudication: Secondary | ICD-10-CM | POA: Insufficient documentation

## 2024-12-15 DIAGNOSIS — M79605 Pain in left leg: Secondary | ICD-10-CM | POA: Insufficient documentation

## 2024-12-15 DIAGNOSIS — M79604 Pain in right leg: Secondary | ICD-10-CM

## 2024-12-15 DIAGNOSIS — M5442 Lumbago with sciatica, left side: Secondary | ICD-10-CM | POA: Insufficient documentation

## 2024-12-15 DIAGNOSIS — M7138 Other bursal cyst, other site: Secondary | ICD-10-CM

## 2024-12-15 MED ORDER — IOHEXOL 180 MG/ML  SOLN
10.0000 mL | Freq: Once | INTRAMUSCULAR | Status: AC
  Administered 2024-12-15: 10 mL via EPIDURAL
  Filled 2024-12-15: qty 20

## 2024-12-15 MED ORDER — LIDOCAINE HCL 2 % IJ SOLN
20.0000 mL | Freq: Once | INTRAMUSCULAR | Status: AC
  Administered 2024-12-15: 100 mg
  Filled 2024-12-15: qty 40

## 2024-12-15 MED ORDER — ROPIVACAINE HCL 2 MG/ML IJ SOLN
2.0000 mL | Freq: Once | INTRAMUSCULAR | Status: AC
  Administered 2024-12-15: 2 mL via EPIDURAL
  Filled 2024-12-15: qty 20

## 2024-12-15 MED ORDER — FENTANYL CITRATE (PF) 100 MCG/2ML IJ SOLN
25.0000 ug | INTRAMUSCULAR | Status: DC | PRN
  Filled 2024-12-15: qty 2

## 2024-12-15 MED ORDER — TRIAMCINOLONE ACETONIDE 40 MG/ML IJ SUSP
40.0000 mg | Freq: Once | INTRAMUSCULAR | Status: AC
  Administered 2024-12-15: 40 mg
  Filled 2024-12-15: qty 1

## 2024-12-15 MED ORDER — SODIUM CHLORIDE 0.9% FLUSH
2.0000 mL | Freq: Once | INTRAVENOUS | Status: AC
  Administered 2024-12-15: 2 mL

## 2024-12-15 MED ORDER — PENTAFLUOROPROP-TETRAFLUOROETH EX AERO
INHALATION_SPRAY | Freq: Once | CUTANEOUS | Status: AC
  Administered 2024-12-15: 30 via TOPICAL

## 2024-12-15 MED ORDER — MIDAZOLAM HCL (PF) 2 MG/2ML IJ SOLN
0.5000 mg | Freq: Once | INTRAMUSCULAR | Status: AC
  Administered 2024-12-15: 2 mg via INTRAVENOUS
  Filled 2024-12-15: qty 2

## 2024-12-15 NOTE — Progress Notes (Signed)
 PROVIDER NOTE: Interpretation of information contained herein should be left to medically-trained personnel. Specific patient instructions are provided elsewhere under Patient Instructions section of medical record. This document was created in part using STT-dictation technology, any transcriptional errors that may result from this process are unintentional.  Patient: Gabrielle Sampson Type: Established DOB: 1971-03-07 MRN: [id number] PCP: Eliazar Hart LABOR, MD  Service: Procedure DOS: 12/15/2024 Setting: Ambulatory Location: Ambulatory outpatient facility Delivery: Face-to-face Provider: Eric LABOR Como, MD Specialty: Interventional Pain Management Specialty designation: 09 Location: Outpatient facility Ref. Prov.: Como Eric, MD       Interventional Therapy   Type: Lumbar epidural steroid injection (LESI) (interlaminar) #2    Laterality: Right   Level:  L4-5 Level.  Imaging: Fluoroscopic guidance Spinal ([id number]) Anesthesia: Local anesthesia (1-2% Lidocaine ) Anxiolysis: IV Versed   2.0 mg           Analgesia: No Sedation None required. No Fentanyl  administered.         DOS: 12/15/2024  Performed by: Eric LABOR Como, MD  Purpose: Diagnostic/Therapeutic Indications: Lumbar radicular pain of intraspinal etiology of more than 4 weeks that has failed to respond to conservative therapy and is severe enough to impact quality of life or function. 1. Chronic low back pain (1ry area of Pain) (Bilateral) w/ sciatica (Bilateral)   2. Chronic lower extremity pain (2ry area of Pain) (Bilateral)   3. Low back pain of over 3 months duration   4. 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 Lumbar lateral recess stenosis (L4-5) (Bilateral) (L>R)   6. Lumbar nerve root impingement (L5) (Left)   7. Low back pain potentially associated with radiculopathy   8. Low back pain radiating to legs (Bilateral)   9. Spinal stenosis of lumbar region with neurogenic  claudication    NAS-11 Pain score:   Pre-procedure: 5 /10   Post-procedure: 0-No pain/10      Position / Prep / Materials:  Position: Prone w/ head of the table raised (slight reverse trendelenburg) to facilitate breathing.  Prep solution: ChloraPrep (2% chlorhexidine gluconate and 70% isopropyl alcohol) Prep Area: Entire Posterior Lumbar Region from lower scapular tip down to mid buttocks area and from flank to flank. Materials:  Tray: Epidural tray Needle(s):  Type: Epidural needle (Tuohy) Gauge (G):  17 Length: Regular (3.5-in) Qty: 1  H&P (Pre-op Assessment):  Gabrielle Sampson is a 54 y.o. (year old), female patient, seen today for interventional treatment. She  has a past surgical history that includes Cesarean section (1996 AND 1999); Gallbladder surgery (1996); Knee surgery (Right, 2012); Tonsillectomy and adenoidectomy; Anal fissure repair; Abscess drainage; Esophagogastroduodenoscopy (egd) with propofol  (N/A, 10/25/2015); Tubal ligation; and Cardiac catheterization. Gabrielle Sampson has a current medication list which includes the following prescription(s): albuterol , amitriptyline, atorvastatin, cyclobenzaprine, duloxetine, famotidine , fluticasone , hydrochlorothiazide, lidocaine , losartan, metformin, ozempic (1 mg/dose), pantoprazole , pregabalin, and promethazine -dextromethorphan, and the following Facility-Administered Medications: fentanyl . Her primarily concern today is the Back Pain (lower)  Initial Vital Signs:  Pulse/HCG Rate: 96ECG Heart Rate: 94 Temp: 98.2 F (36.8 C) Resp: 16 BP: (!) 142/82 SpO2: 99 %  BMI: Estimated body mass index is 44.1 kg/m as calculated from the following:   Height as of this encounter: 5' 5 (1.651 m).   Weight as of this encounter: 265 lb (120.2 kg).  Risk Assessment: Allergies: Reviewed. She is allergic to lisinopril, niacin, sulfa antibiotics, codeine phosphate, empagliflozin, and morphine and codeine.  Allergy Precautions: None  required Coagulopathies: Reviewed. None identified.  Blood-thinner therapy: None at  this time Active Infection(s): Reviewed. None identified. Gabrielle Sampson is afebrile  Site Confirmation: Gabrielle Sampson was asked to confirm the procedure and laterality before marking the site Procedure checklist: Completed Consent: Before the procedure and under the influence of no sedative(s), amnesic(s), or anxiolytics, the patient was informed of the treatment options, risks and possible complications. To fulfill our ethical and legal obligations, as recommended by the American Medical Association's Code of Ethics, I have informed the patient of my clinical impression; the nature and purpose of the treatment or procedure; the risks, benefits, and possible complications of the intervention; the alternatives, including doing nothing; the risk(s) and benefit(s) of the alternative treatment(s) or procedure(s); and the risk(s) and benefit(s) of doing nothing. The patient was provided information about the general risks and possible complications associated with the procedure. These may include, but are not limited to: failure to achieve desired goals, infection, bleeding, organ or nerve damage, allergic reactions, paralysis, and death. In addition, the patient was informed of those risks and complications associated to Spine-related procedures, such as failure to decrease pain; infection (i.e.: Meningitis, epidural or intraspinal abscess); bleeding (i.e.: epidural hematoma, subarachnoid hemorrhage, or any other type of intraspinal or peri-dural bleeding); organ or nerve damage (i.e.: Any type of peripheral nerve, nerve root, or spinal cord injury) with subsequent damage to sensory, motor, and/or autonomic systems, resulting in permanent pain, numbness, and/or weakness of one or several areas of the body; allergic reactions; (i.e.: anaphylactic reaction); and/or death. Furthermore, the patient was informed of those risks and  complications associated with the medications. These include, but are not limited to: allergic reactions (i.e.: anaphylactic or anaphylactoid reaction(s)); adrenal axis suppression; blood sugar elevation that in diabetics may result in ketoacidosis or comma; water retention that in patients with history of congestive heart failure may result in shortness of breath, pulmonary edema, and decompensation with resultant heart failure; weight gain; swelling or edema; medication-induced neural toxicity; particulate matter embolism and blood vessel occlusion with resultant organ, and/or nervous system infarction; and/or aseptic necrosis of one or more joints. Finally, the patient was informed that Medicine is not an exact science; therefore, there is also the possibility of unforeseen or unpredictable risks and/or possible complications that may result in a catastrophic outcome. The patient indicated having understood very clearly. We have given the patient no guarantees and we have made no promises. Enough time was given to the patient to ask questions, all of which were answered to the patient's satisfaction. Ms. Nolet has indicated that she wanted to continue with the procedure. Attestation: I, the ordering provider, attest that I have discussed with the patient the benefits, risks, side-effects, alternatives, likelihood of achieving goals, and potential problems during recovery for the procedure that I have provided informed consent. Date  Time: 12/15/2024  9:19 AM  Pre-Procedure Preparation:  Monitoring: As per clinic protocol. Respiration, ETCO2, SpO2, BP, heart rate and rhythm monitor placed and checked for adequate function Safety Precautions: Patient was assessed for positional comfort and pressure points before starting the procedure. Time-out: I initiated and conducted the Time-out before starting the procedure, as per protocol. The patient was asked to participate by confirming the accuracy of the  Time Out information. Verification of the correct person, site, and procedure were performed and confirmed by me, the nursing staff, and the patient. Time-out conducted as per Joint Commission's Universal Protocol (UP.01.01.01). Time: 1003 Start Time: 1003 hrs.  Description/Narrative of Procedure:          Target: Epidural space  via interlaminar opening, initially targeting the lower laminar border of the superior vertebral body. Region: Lumbar Approach: Percutaneous paravertebral  Rationale (medical necessity): procedure needed and proper for the diagnosis and/or treatment of the patient's medical symptoms and needs. Procedural Technique Safety Precautions: Aspiration looking for blood return was conducted prior to all injections. At no point did we inject any substances, as a needle was being advanced. No attempts were made at seeking any paresthesias. Safe injection practices and needle disposal techniques used. Medications properly checked for expiration dates. SDV (single dose vial) medications used. Description of the Procedure: Protocol guidelines were followed. The procedure needle was introduced through the skin, ipsilateral to the reported pain, and advanced to the target area. Bone was contacted and the needle walked caudad, until the lamina was cleared. The epidural space was identified using loss-of-resistance technique with 2-3 ml of PF-NaCl (0.9% NSS), in a 5cc LOR glass syringe.  Vitals:   12/15/24 1002 12/15/24 1007 12/15/24 1013 12/15/24 1019  BP: (!) 151/95 (!) 150/96 (!) 140/92 (!) 143/83  Pulse:      Resp: 18 15 18 18   Temp:      TempSrc:      SpO2: 99% 96% 97% 99%  Weight:      Height:        Start Time: 1003 hrs. End Time:   hrs.  Imaging Guidance (Spinal):          Type of Imaging Technique: Fluoroscopy Guidance (Spinal) Indication(s): Fluoroscopy guidance for needle placement to enhance accuracy in procedures requiring precise needle localization for  targeted delivery of medication in or near specific anatomical locations not easily accessible without such real-time imaging assistance. Exposure Time: Please see nurses notes. Contrast: Before injecting any contrast, we confirmed that the patient did not have an allergy to iodine, shellfish, or radiological contrast. Once satisfactory needle placement was completed at the desired level, radiological contrast was injected. Contrast injected under live fluoroscopy. No contrast complications. See chart for type and volume of contrast used. Fluoroscopic Guidance: I was personally present during the use of fluoroscopy. Tunnel Vision Technique used to obtain the best possible view of the target area. Parallax error corrected before commencing the procedure. Direction-depth-direction technique used to introduce the needle under continuous pulsed fluoroscopy. Once target was reached, antero-posterior, oblique, and lateral fluoroscopic projection used confirm needle placement in all planes. Images permanently stored in EMR. Interpretation: I personally interpreted the imaging intraoperatively. Adequate needle placement confirmed in multiple planes. Appropriate spread of contrast into desired area was observed. No evidence of afferent or efferent intravascular uptake. No intrathecal or subarachnoid spread observed. Permanent images saved into the patient's record.  Antibiotic Prophylaxis:   Anti-infectives (From admission, onward)    None      Indication(s): None identified  Post-operative Assessment:  Post-procedure Vital Signs:  Pulse/HCG Rate: 9690 Temp: 98.2 F (36.8 C) Resp: 18 BP: (!) 143/83 SpO2: 99 %  EBL: None  Complications: No immediate post-treatment complications observed by team, or reported by patient.  Note: The patient tolerated the entire procedure well. A repeat set of vitals were taken after the procedure and the patient was kept under observation following institutional  policy, for this type of procedure. Post-procedural neurological assessment was performed, showing return to baseline, prior to discharge. The patient was provided with post-procedure discharge instructions, including a section on how to identify potential problems. Should any problems arise concerning this procedure, the patient was given instructions to immediately contact us , at any time, without  hesitation. In any case, we plan to contact the patient by telephone for a follow-up status report regarding this interventional procedure.  Comments:  No additional relevant information.  Plan of Care (POC)  Orders:  Orders Placed This Encounter  Procedures   Lumbar Epidural Injection    Scheduling Instructions:     Procedure: Interlaminar LESI L4-5     Laterality: Right     Procedural Analgesia/Anxiolysis: Patient's choice     Date: 12/15/2024    Where will this procedure be performed?:   [facility]   [facility] C-ARM 1-60 MIN NO REPORT    Intraoperative interpretation by procedural physician at [facility].    Standing Status:   Standing    Number of Occurrences:   1    Reason for exam::   Assistance in needle guidance and placement for procedures requiring needle placement in or near specific anatomical locations not easily accessible without such assistance.   Informed Consent Details: Physician/Practitioner Attestation; Transcribe to consent form and obtain patient signature    Note: Always confirm laterality of pain with Ms. Claudene, before procedure. Transcribe to consent form and obtain patient signature.    Physician/Practitioner attestation of informed consent for procedure/surgical case:   I, the physician/practitioner, attest that I have discussed with the patient the benefits, risks, side effects, alternatives, likelihood of achieving goals and potential problems during recovery for the procedure that I have provided informed consent.    Procedure:   Lumbar  epidural steroid injection under fluoroscopic guidance    Physician/Practitioner performing the procedure:   Shalla Bulluck A. Tanya, MD    Indication/Reason:   Low back and/or lower extremity pain secondary to lumbar radiculitis   Provide equipment / supplies at bedside    Procedural tray: Epidural Tray (Disposable  single use) Skin infiltration needle: Regular 1.5-in, 25-G, (x1) Block needle size: Regular standard Catheter: No catheter required    Standing Status:   Standing    Number of Occurrences:   1    Specify:   Epidural Tray   Saline lock IV    Have LR [phone] mL available and administer at 125 mL/hr if patient becomes hypotensive.    Standing Status:   Standing    Number of Occurrences:   1     Opioid Analgesic: None MME/day: 0 mg/day    Medications ordered for procedure: Meds ordered this encounter  Medications   iohexol  (OMNIPAQUE ) 180 MG/ML injection 10 mL    Must be Myelogram-compatible. If not available, you may substitute with a water-soluble, non-ionic, hypoallergenic, myelogram-compatible radiological contrast medium.   lidocaine  (XYLOCAINE ) 2 % (with pres) injection 400 mg   pentafluoroprop-tetrafluoroeth (GEBAUERS) aerosol   midazolam  PF (VERSED ) injection 0.5-2 mg    Make sure Flumazenil is available in the pyxis when using this medication. If oversedation occurs, administer 0.2 mg IV over 15 sec. If after 45 sec no response, administer 0.2 mg again over 1 min; may repeat at 1 min intervals; not to exceed 4 doses (1 mg)   fentaNYL  (SUBLIMAZE ) injection 25-50 mcg    Make sure Narcan is available in the pyxis when using this medication. In the event of respiratory depression (RR< 8/min): Titrate NARCAN (naloxone) in increments of 0.1 to 0.2 mg IV at 2-3 minute intervals, until desired degree of reversal.   sodium chloride  flush (NS) 0.9 % injection 2 mL   ropivacaine  (PF) 2 mg/mL (0.2%) (NAROPIN ) injection 2 mL   triamcinolone  acetonide (KENALOG -40) injection 40  mg  Medications administered: We administered iohexol , lidocaine , pentafluoroprop-tetrafluoroeth, midazolam  PF, sodium chloride  flush, ropivacaine  (PF) 2 mg/mL (0.2%), and triamcinolone  acetonide.  See the medical record for exact dosing, route, and time of administration.    Interventional Therapies  Risk Factors  Considerations  Medical Comorbidities:  ALLERGY: Betadine, Iodine, Codeine, Morphine  MO (BMI>40)  T2IIDDM  HTN  OSA  Tobacco use/Smoker  GERD  GAD w/ Panic Attacks  IBS     Planned  Pending:   Diagnostic/therapeutic right L4-5 LESI #2    Under consideration:   Diagnostic/therapeutic left cervical ESI #1 (initially scheduled for this on 08/15/2024, but changed by request of neurosurgery) Diagnostic/therapeutic right L4-5 LESI #2  Diagnostic/therapeutic bilateral lumbar facet MBB #1  Patient must bring BMI down to <30 kg/m   Completed: (Analgesic benefit)1  Diagnostic/therapeutic right L4-5 LESI x1 (09/20/2024) (100/100/50/50) pain is now intermittent   Therapeutic  Palliative (PRN) options:   None established   Completed by other providers: (See under care everywhere x-rays)  Therapeutic (Undetermined laterality) L4-5 IL LESI x2 (10/31/2021, 02/17/2022) by Curtistine FABIENE Sarna, MD [facility]) (w/ Dexamethasone + Lidocaine )  Therapeutic right L5-S1 TFESI x1 (05/29/2021) by Gaurav Telhan, MD [facility] PMR)  Therapeutic right L4-5 facet joint cyst aspiration/inj. x1 (04/23/2020) by Franky LABOR. Carneiro, DO [facility] PMR)   1(Analgesic benefit): Expressed in percentage (%). (Local anesthetic[LA] +/- sedation  L.A.Local Anesthetic  Steroid benefit  Ongoing benefit)      Follow-up plan:   Return in about 2 weeks (around 12/29/2024) for (Face2F), (PPE).     Recent Visits Date Type Provider Dept  10/31/24 Office Visit Tanya Glisson, MD [facility]  09/20/24 Procedure visit Tanya Glisson, MD [facility]  Showing  recent visits within past 90 days and meeting all other requirements Today's Visits Date Type Provider Dept  12/15/24 Procedure visit Tanya Glisson, MD [facility]  Showing today's visits and meeting all other requirements Future Appointments Date Type Provider Dept  01/04/25 Appointment Tanya Glisson, MD [facility]  Showing future appointments within next 90 days and meeting all other requirements   Disposition: Discharge home  Discharge (Date  Time): 12/15/2024; 1030 hrs.   Primary Care Physician: Eliazar Hart LABOR, MD Location: [facility] Note by: Glisson LABOR Tanya, MD (TTS technology used. I apologize for any typographical errors that were not detected and corrected.) Date: 12/15/2024; Time: 10:53 AM  Disclaimer:  Medicine is not an visual merchandiser. The only guarantee in medicine is that nothing is guaranteed. It is important to note that the decision to proceed with this intervention was based on the information collected from the patient. The Data and conclusions were drawn from the patient's questionnaire, the interview, and the physical examination. Because the information was provided in large part by the patient, it cannot be guaranteed that it has not been purposely or unconsciously manipulated. Every effort has been made to obtain as much relevant data as possible for this evaluation. It is important to note that the conclusions that lead to this procedure are derived in large part from the available data. Always take into account that the treatment will also be dependent on availability of resources and existing treatment guidelines, considered by other Pain Management Practitioners as being common knowledge and practice, at the time of the intervention. For Medico-Legal purposes, it is also important to point out that variation in procedural techniques and pharmacological choices are the acceptable norm. The indications,  contraindications, technique,  and results of the above procedure should only be interpreted and judged by a Board-Certified Interventional Pain Specialist with extensive familiarity and expertise in the same exact procedure and technique.

## 2024-12-15 NOTE — Patient Instructions (Signed)
 ______________________________________________________________________    Post-Procedure Discharge Instructions  INSTRUCTIONS Apply ice:  Purpose: This will minimize any swelling and discomfort after procedure.  When: Day of procedure, as soon as you get home. How: Fill a plastic sandwich bag with crushed ice. Cover it with a small towel and apply to injection site. How long: (15 min on, 15 min off) Apply for 15 minutes then remove x 15 minutes.  Repeat sequence on day of procedure, until you go to bed. Apply heat:  Purpose: To treat any soreness and discomfort from the procedure. When: Starting the next day after the procedure. How: Apply heat to procedure site starting the day following the procedure. How long: May continue to repeat daily, until discomfort goes away. Food intake: Start with clear liquids (like water) and advance to regular food, as tolerated.  Physical activities: Keep activities to a minimum for the first 8 hours after the procedure. After that, then as tolerated. Driving: If you have received any sedation, be responsible and do not drive. You are not allowed to drive for 24 hours after having sedation. Blood thinner: (Applies only to those taking blood thinners) You may restart your blood thinner 6 hours after your procedure. Insulin: (Applies only to Diabetic patients taking insulin) As soon as you can eat, you may resume your normal dosing schedule. Infection prevention: Keep procedure site clean and dry. Shower daily and clean area with soap and water.  PAIN DIARY Post-procedure Pain Diary: Extremely important that this be done correctly and accurately. Recorded information will be used to determine the next step in treatment. For the purpose of accuracy, follow these rules: Evaluate only the area treated. Do not report or include pain from an untreated area. For the purpose of this evaluation, ignore all other areas of pain, except for the treated area. After your  procedure, avoid taking a long nap and attempting to complete the pain diary after you wake up. Instead, set your alarm clock to go off every hour, on the hour, for the initial 8 hours after the procedure. Document the duration of the numbing medicine, and the relief you are getting from it. Do not go to sleep and attempt to complete it later. It will not be accurate. If you received sedation, it is likely that you were given a medication that may cause amnesia. Because of this, completing the diary at a later time may cause the information to be inaccurate. This information is needed to plan your care. Follow-up appointment: Keep your post-procedure follow-up evaluation appointment after the procedure (usually 2 weeks for most procedures, 6 weeks for radiofrequencies). DO NOT FORGET to bring you pain diary with you.   EXPECT... (What should I expect to see with my procedure?) From numbing medicine (AKA: Local Anesthetics): Numbness or decrease in pain. You may also experience some weakness, which if present, could last for the duration of the local anesthetic. Onset: Full effect within 15 minutes of injected. Duration: It will depend on the type of local anesthetic used. On the average, 1 to 8 hours.  From steroids (Applies only if steroids were used): Decrease in swelling or inflammation. Once inflammation is improved, relief of the pain will follow. Onset of benefits: Depends on the amount of swelling present. The more swelling, the longer it will take for the benefits to be seen. In some cases, up to 10 days. Duration: Steroids will stay in the system x 2 weeks. Duration of benefits will depend on multiple posibilities including persistent irritating  factors. Side-effects: If present, they may typically last 2 weeks (the duration of the steroids). Frequent: Cramps (if they occur, drink Gatorade and take over-the-counter Magnesium 450-500 mg once to twice a day); water retention with temporary weight  gain; increases in blood sugar; decreased immune system response; increased appetite. Occasional: Facial flushing (red, warm cheeks); mood swings; menstrual changes. Uncommon: Long-term decrease or suppression of natural hormones; bone thinning. (These are more common with higher doses or more frequent use. This is why we prefer that our patients avoid having any injection therapies in other practices.)  Very Rare: Severe mood changes; psychosis; aseptic necrosis. From procedure: Some discomfort is to be expected once the numbing medicine wears off. This should be minimal if ice and heat are applied as instructed.  CALL IF... (When should I call?) You experience numbness and weakness that gets worse with time, as opposed to wearing off. New onset bowel or bladder incontinence. (Applies only to procedures done in the spine)  Emergency Numbers: Durning business hours (Monday - Thursday, 8:00 AM - 4:00 PM) (Friday, 9:00 AM - 12:00 Noon): [phone] After hours: [phone] NOTE: If you are having a problem and are unable connect with, or to talk to a provider, then go to your nearest urgent care or emergency department. If the problem is serious and urgent, please call 911. ______________________________________________________________________     ______________________________________________________________________    Steroid injections  Common steroids for injections Triamcinolone : Used by many sports medicine physicians for large joint and bursal injections, often combined with a local anesthetic like lidocaine . A study focusing on coccydynia (tailbone pain) found triamcinolone  was more effective than betamethasone, suggesting it may also be preferable for other localized inflammation conditions. Methylprednisolone: A common alternative to triamcinolone  that is also a strong anti-inflammatory. It is available in different formulations, with the acetate suspension being the long-acting  option for intra-articular injections. Dexamethasone : This is a non-particulate steroid, meaning it has a lower risk of tissue damage compared to particulate steroids like triamcinolone  and methylprednisolone. While less common for this specific use, it is an option for targeted injections.   Considerations for physicians Particulate vs. non-particulate steroids: Triamcinolone  and methylprednisolone are particulate, meaning they can clump together. Dexamethasone  is non-particulate. Particulate steroids are often preferred for their longer-lasting effects but carry a theoretical higher risk for certain injections (though this is less of a concern in the costochondral joints). Combined injectate: Corticosteroids are typically mixed with a local anesthetic like lidocaine  to provide both immediate pain relief (from the anesthetic) and longer-term inflammation reduction (from the steroid). Imaging guidance: To ensure accurate placement of the needle and medication, physicians may use ultrasound or fluoroscopic guidance for the injection, especially in complex or refractory cases.   Patient guidance Before undergoing a steroid injection, discuss the options with your physician. They will determine the best steroid, dosage, and procedure for your specific case based on factors like: Severity of your condition History of response to other treatments Your overall health status Experience and preference of the physician  Last  Updated: 07/12/2024 ______________________________________________________________________

## 2025-01-04 ENCOUNTER — Ambulatory Visit: Admitting: Pain Medicine

## 2025-01-30 ENCOUNTER — Ambulatory Visit: Admit: 2025-01-30 | Admitting: Gastroenterology

## 2025-01-30 SURGERY — EGD (ESOPHAGOGASTRODUODENOSCOPY)
Anesthesia: General

## 2025-02-10 ENCOUNTER — Ambulatory Visit: Admitting: Family Medicine
